# Patient Record
Sex: Female | Born: 1990 | Race: White | Hispanic: No | Marital: Married | Smoking: Current every day smoker
Health system: Southern US, Community
[De-identification: ages and names within clinical notes are randomized; demographics above are authoritative.]

## PROBLEM LIST (undated history)

## (undated) ENCOUNTER — Inpatient Hospital Stay (HOSPITAL_COMMUNITY): Payer: Self-pay

## (undated) DIAGNOSIS — G43909 Migraine, unspecified, not intractable, without status migrainosus: Secondary | ICD-10-CM

## (undated) DIAGNOSIS — F502 Bulimia nervosa, unspecified: Secondary | ICD-10-CM

## (undated) DIAGNOSIS — F329 Major depressive disorder, single episode, unspecified: Secondary | ICD-10-CM

## (undated) DIAGNOSIS — F319 Bipolar disorder, unspecified: Secondary | ICD-10-CM

## (undated) DIAGNOSIS — I10 Essential (primary) hypertension: Secondary | ICD-10-CM

## (undated) DIAGNOSIS — F410 Panic disorder [episodic paroxysmal anxiety] without agoraphobia: Secondary | ICD-10-CM

## (undated) DIAGNOSIS — J302 Other seasonal allergic rhinitis: Secondary | ICD-10-CM

## (undated) DIAGNOSIS — F419 Anxiety disorder, unspecified: Secondary | ICD-10-CM

## (undated) DIAGNOSIS — O139 Gestational [pregnancy-induced] hypertension without significant proteinuria, unspecified trimester: Secondary | ICD-10-CM

## (undated) DIAGNOSIS — K219 Gastro-esophageal reflux disease without esophagitis: Secondary | ICD-10-CM

## (undated) DIAGNOSIS — F32A Depression, unspecified: Secondary | ICD-10-CM

## (undated) HISTORY — DX: Anxiety disorder, unspecified: F41.9

## (undated) HISTORY — DX: Bulimia nervosa, unspecified: F50.20

## (undated) HISTORY — DX: Bulimia nervosa: F50.2

## (undated) HISTORY — PX: TONSILLECTOMY: SUR1361

## (undated) HISTORY — DX: Depression, unspecified: F32.A

## (undated) HISTORY — PX: TYMPANOSTOMY TUBE PLACEMENT: SHX32

## (undated) HISTORY — DX: Gestational (pregnancy-induced) hypertension without significant proteinuria, unspecified trimester: O13.9

## (undated) HISTORY — DX: Major depressive disorder, single episode, unspecified: F32.9

## (undated) HISTORY — DX: Gastro-esophageal reflux disease without esophagitis: K21.9

---

## 2013-10-07 ENCOUNTER — Emergency Department (HOSPITAL_COMMUNITY): Payer: Medicaid Other

## 2013-10-07 ENCOUNTER — Emergency Department (HOSPITAL_COMMUNITY)
Admission: EM | Admit: 2013-10-07 | Discharge: 2013-10-08 | Disposition: A | Discharge status: Home / Self Care | Payer: Medicaid Other | Attending: Emergency Medicine | Admitting: Emergency Medicine

## 2013-10-07 ENCOUNTER — Encounter (HOSPITAL_COMMUNITY): Payer: Self-pay | Admitting: Emergency Medicine

## 2013-10-07 DIAGNOSIS — N898 Other specified noninflammatory disorders of vagina: Secondary | ICD-10-CM | POA: Insufficient documentation

## 2013-10-07 DIAGNOSIS — Z30431 Encounter for routine checking of intrauterine contraceptive device: Secondary | ICD-10-CM | POA: Diagnosis not present

## 2013-10-07 DIAGNOSIS — Z3202 Encounter for pregnancy test, result negative: Secondary | ICD-10-CM | POA: Insufficient documentation

## 2013-10-07 DIAGNOSIS — R110 Nausea: Secondary | ICD-10-CM

## 2013-10-07 DIAGNOSIS — R112 Nausea with vomiting, unspecified: Secondary | ICD-10-CM | POA: Insufficient documentation

## 2013-10-07 DIAGNOSIS — R109 Unspecified abdominal pain: Secondary | ICD-10-CM | POA: Insufficient documentation

## 2013-10-07 DIAGNOSIS — R1032 Left lower quadrant pain: Secondary | ICD-10-CM

## 2013-10-07 DIAGNOSIS — R1031 Right lower quadrant pain: Secondary | ICD-10-CM

## 2013-10-07 LAB — CBC
HCT: 39.4 % (ref 36.0–46.0)
HEMOGLOBIN: 13.6 g/dL (ref 12.0–15.0)
MCH: 29.4 pg (ref 26.0–34.0)
MCHC: 34.5 g/dL (ref 30.0–36.0)
MCV: 85.3 fL (ref 78.0–100.0)
Platelets: 254 10*3/uL (ref 150–400)
RBC: 4.62 MIL/uL (ref 3.87–5.11)
RDW: 12.8 % (ref 11.5–15.5)
WBC: 7.2 10*3/uL (ref 4.0–10.5)

## 2013-10-07 LAB — URINALYSIS, ROUTINE W REFLEX MICROSCOPIC
BILIRUBIN URINE: NEGATIVE
Glucose, UA: NEGATIVE mg/dL
KETONES UR: NEGATIVE mg/dL
NITRITE: NEGATIVE
PH: 5.5 (ref 5.0–8.0)
Protein, ur: NEGATIVE mg/dL
Specific Gravity, Urine: 1.026 (ref 1.005–1.030)
Urobilinogen, UA: 1 mg/dL (ref 0.0–1.0)

## 2013-10-07 LAB — BASIC METABOLIC PANEL
Anion gap: 11 (ref 5–15)
BUN: 15 mg/dL (ref 6–23)
CALCIUM: 9.6 mg/dL (ref 8.4–10.5)
CO2: 25 mEq/L (ref 19–32)
CREATININE: 1.01 mg/dL (ref 0.50–1.10)
Chloride: 104 mEq/L (ref 96–112)
GFR calc Af Amer: >90 mL/min (ref >90)
GFR, EST NON AFRICAN AMERICAN: 79 mL/min — AB (ref >90)
GLUCOSE: 89 mg/dL (ref 70–99)
Potassium: 4.2 mEq/L (ref 3.7–5.3)
SODIUM: 140 meq/L (ref 137–147)

## 2013-10-07 LAB — WET PREP, GENITAL
TRICH WET PREP: NONE SEEN
YEAST WET PREP: NONE SEEN

## 2013-10-07 LAB — URINE MICROSCOPIC-ADD ON

## 2013-10-07 LAB — POC URINE PREG, ED: Preg Test, Ur: NEGATIVE

## 2013-10-07 LAB — LIPASE, BLOOD: Lipase: 23 U/L (ref 11–59)

## 2013-10-07 MED ORDER — ONDANSETRON 4 MG PO TBDP
4.0000 mg | ORAL_TABLET | Freq: Once | ORAL | Status: AC
  Administered 2013-10-08: 4 mg via ORAL
  Filled 2013-10-07: qty 1

## 2013-10-07 MED ORDER — SODIUM CHLORIDE 0.9 % IV BOLUS (SEPSIS): 1000.0000 mL | INTRAVENOUS | Status: DC

## 2013-10-07 MED ORDER — ONDANSETRON HCL 4 MG/2ML IJ SOLN
4.0000 mg | Freq: Once | INTRAMUSCULAR | Status: DC
  Filled 2013-10-07: qty 2

## 2013-10-07 NOTE — ED Notes (Signed)
Patient has been to OB a month ago and was told that every thing was fine after a digital exam, but patient believes the IUD is the source of her pain. She has been spotting ever since having it placed, but this was to be expected. Patient denies any urinary symptoms. She feels like the wire from the device is rubbing against her insides. She has not taken any pain medications.

## 2013-10-07 NOTE — ED Provider Notes (Signed)
CSN: [id number]     Arrival date & time 10/07/13  1858 History   First MD Initiated Contact with Patient 10/07/13 2019     Chief Complaint  Patient presents with  . Nausea  . Emesis     (Consider location/radiation/quality/duration/timing/severity/associated sxs/prior Treatment) The history is provided by the patient and medical records. No language interpreter was used.    Kylie Bailey is a 23 y.o. female G1P0101 with no major medical Hx presents to the Emergency Department complaining of gradual, persistent, progressively worsening lower abd cramping onset 2 days ago, but patient had been experiencing the same pain to a lesser degree intermittently since the insertion of her Mirena IUD in March.  Pt describes the pain as cramping.  She reports a follow up with her GYN who informed her the pain is normal.  Pt reports nausea for the last 2 days with 2 episodes of NBNB emesis.  Pt denies sick contacts.  Pt has not attempted any OTC treatments for the pain.  Nothing makes it better or worse.   Pt denies fever, chills, headache, neck pain, chest pain, SOB, diarrhea, weakness, dizziness, syncope.      History reviewed. No pertinent past medical history. History reviewed. No pertinent past surgical history. History reviewed. No pertinent family history. History  Substance Use Topics  . Smoking status: Passive Smoke Exposure - Never Smoker  . Smokeless tobacco: Not on file  . Alcohol Use: Yes   OB History   Grav Para Term Preterm Abortions TAB SAB Ect Mult Living                 Review of Systems  Constitutional: Negative for fever, diaphoresis, appetite change, fatigue and unexpected weight change.  HENT: Negative for mouth sores and trouble swallowing.   Respiratory: Negative for cough, chest tightness, shortness of breath, wheezing and stridor.   Cardiovascular: Negative for chest pain and palpitations.  Gastrointestinal: Positive for nausea, vomiting (x2) and abdominal pain  (lower). Negative for diarrhea, constipation, blood in stool, abdominal distention and rectal pain.  Genitourinary: Positive for vaginal bleeding (spotting). Negative for dysuria, urgency, frequency, hematuria, flank pain and difficulty urinating.  Musculoskeletal: Negative for back pain, neck pain and neck stiffness.  Skin: Negative for rash.  Neurological: Negative for weakness.  Hematological: Negative for adenopathy.  Psychiatric/Behavioral: Negative for confusion.  All other systems reviewed and are negative.     Allergies  Review of patient's allergies indicates no known allergies.  Home Medications   Prior to Admission medications   Medication Sig Start Date End Date Taking? Authorizing Provider  levonorgestrel (MIRENA) 20 MCG/24HR IUD 1 each by Intrauterine route once.   Yes Historical Provider, MD  ondansetron (ZOFRAN ODT) 4 MG disintegrating tablet 4mg  ODT q4 hours prn nausea/vomit 10/08/13   Edina Winningham, PA-C   BP 132/80  Pulse 71  Temp(Src) 98.3 F (36.8 C) (Oral)  Resp 19  Ht 5\' 9"  (1.753 m)  Wt 284 lb (128.822 kg)  BMI 41.92 kg/m2  SpO2 99% Physical Exam  Nursing note and vitals reviewed. Constitutional: She is oriented to person, place, and time. She appears well-developed and well-nourished. No distress.  Awake, alert, nontoxic appearance  HENT:  Head: Normocephalic and atraumatic.  Mouth/Throat: Oropharynx is clear and moist. No oropharyngeal exudate.  Eyes: Conjunctivae are normal. No scleral icterus.  Neck: Normal range of motion. Neck supple.  Cardiovascular: Normal rate, regular rhythm, normal heart sounds and intact distal pulses.   No murmur heard. Pulmonary/Chest: Effort normal  and breath sounds normal. No respiratory distress. She has no wheezes.  Abdominal: Soft. Bowel sounds are normal. She exhibits no distension and no mass. There is no hepatosplenomegaly. There is no tenderness. There is no rebound, no guarding and no CVA tenderness.   Genitourinary: Uterus normal. Pelvic exam was performed with patient supine. There is no rash, tenderness or lesion on the right labia. There is no rash, tenderness or lesion on the left labia. Uterus is not deviated, not enlarged, not fixed and not tender. Cervix exhibits no motion tenderness, no discharge and no friability. Right adnexum displays no mass, no tenderness and no fullness. Left adnexum displays no mass, no tenderness and no fullness. There is bleeding (scant) around the vagina. No erythema or tenderness around the vagina. No foreign body around the vagina. No signs of injury around the vagina. No vaginal discharge found.  Scant amount of brown discharge/blood in the vaginal vault Strings of IUD visible in the cervical os; no portion of the IUD is visible; IUD not palpable   Musculoskeletal: Normal range of motion. She exhibits no edema.  Lymphadenopathy:    She has no cervical adenopathy.  Neurological: She is alert and oriented to person, place, and time. She exhibits normal muscle tone. Coordination normal.  Speech is clear and goal oriented Moves extremities without ataxia  Skin: Skin is warm and dry. No rash noted. She is not diaphoretic. No erythema.  Psychiatric: She has a normal mood and affect.    ED Course  Procedures (including critical care time) Labs Review Labs Reviewed  WET PREP, GENITAL - Abnormal; Notable for the following:    Clue Cells Wet Prep HPF POC RARE (*)    WBC, Wet Prep HPF POC RARE (*)    All other components within normal limits  BASIC METABOLIC PANEL - Abnormal; Notable for the following:    GFR calc non Af Amer 79 (*)    All other components within normal limits  URINALYSIS, ROUTINE W REFLEX MICROSCOPIC - Abnormal; Notable for the following:    APPearance CLOUDY (*)    Hgb urine dipstick LARGE (*)    Leukocytes, UA SMALL (*)    All other components within normal limits  URINE MICROSCOPIC-ADD ON - Abnormal; Notable for the following:     Squamous Epithelial / LPF FEW (*)    All other components within normal limits  GC/CHLAMYDIA PROBE AMP  CBC  LIPASE, BLOOD  POC URINE PREG, ED    Imaging Review US Transvaginal Non-ob  10/07/2013   CLINICAL DATA:  Pelvic pain and vaginal bleeding. Query IUD placement.  EXAM: TRANSABDOMINAL AND TRANSVAGINAL ULTRASOUND OF PELVIS  TECHNIQUE: Both transabdominal and transvaginal ultrasound examinations of the pelvis were performed. Transabdominal technique was performed for global imaging of the pelvis including uterus, ovaries, adnexal regions, and pelvic cul-de-sac. It was necessary to proceed with endovaginal exam following the transabdominal exam to visualize the endometrium and ovaries.  COMPARISON:  None  FINDINGS: Uterus  Measurements: 7.7 x 3.7 x 4.3 cm, anteverted. No fibroids or other mass visualized.  Endometrium  Thickness: 4.7 mm. Echogenic shadowing structure consistent with intrauterine device. No focal abnormality visualized.  Right ovary  Measurements: 3.6 x 2.4 x 2.3 cm. Normal appearance/no adnexal mass.  Left ovary  Measurements: 3.7 x 2.3 x 3.1 cm. Normal appearance/no adnexal mass.  Other findings  Minimal free fluid in the pelvis is likely physiologic.  IMPRESSION: Intrauterine device is demonstrated. Uterus and ovaries are otherwise unremarkable.   Electronically Signed  By: Burman NievesWilliam  Stevens M.D.   On: 10/07/2013 23:01   [place] Pelvis Complete  10/07/2013   CLINICAL DATA:  Pelvic pain and vaginal bleeding. Query IUD placement.  EXAM: TRANSABDOMINAL AND TRANSVAGINAL ULTRASOUND OF PELVIS  TECHNIQUE: Both transabdominal and transvaginal ultrasound examinations of the pelvis were performed. Transabdominal technique was performed for global imaging of the pelvis including uterus, ovaries, adnexal regions, and pelvic cul-de-sac. It was necessary to proceed with endovaginal exam following the transabdominal exam to visualize the endometrium and ovaries.  COMPARISON:  None  FINDINGS: Uterus   Measurements: 7.7 x 3.7 x 4.3 cm, anteverted. No fibroids or other mass visualized.  Endometrium  Thickness: 4.7 mm. Echogenic shadowing structure consistent with intrauterine device. No focal abnormality visualized.  Right ovary  Measurements: 3.6 x 2.4 x 2.3 cm. Normal appearance/no adnexal mass.  Left ovary  Measurements: 3.7 x 2.3 x 3.1 cm. Normal appearance/no adnexal mass.  Other findings  Minimal free fluid in the pelvis is likely physiologic.  IMPRESSION: Intrauterine device is demonstrated. Uterus and ovaries are otherwise unremarkable.   Electronically Signed   By: Burman NievesWilliam  Stevens M.D.   On: 10/07/2013 23:01     EKG Interpretation None      MDM   Final diagnoses:  IUD check up  Bilateral lower abdominal cramping  Nausea   Kylie Bailey presents with nausea and emesis x2 the last several days with complaints of lower abdominal cramping since the insertion of her Mirena IUD.  Patient has seen OB/GYN who reports this is normal she states she does not think that it is.  Patient refuses IV, fluid bolus but will take nausea medicine by mouth.  All labs unremarkable; no evidence of STD on wet prep.    US pending to confirm proper placement of her IUD.    11:59 PM US with normal placement of IUD.  Patient's pain is likely secondary to her IUD. Patient given Zofran with resolution of her nausea and she has tolerated by mouth fluids and crackers without difficulty.  Patient is nontoxic, nonseptic appearing, in no apparent distress.  Patient's pain and other symptoms adequately managed in emergency department.  Fluid bolus given.  Labs, imaging and vitals reviewed.  Patient does not meet the SIRS or Sepsis criteria.  On repeat exam patient does not have a surgical abdomin and there are nor peritoneal signs.  No indication of appendicitis, bowel obstruction, bowel perforation, cholecystitis, diverticulitis, PID or ectopic pregnancy.  Patient discharged home with symptomatic treatment and  given strict instructions for follow-up with their primary care physician.  I have also discussed reasons to return immediately to the ER.  Patient expresses understanding and agrees with plan.  I have personally reviewed patient's vitals, nursing note and any pertinent labs or imaging.  I performed an undressed physical exam.    At this time, it has been determined that no acute conditions requiring further emergency intervention. The patient/guardian have been advised of the diagnosis and plan. I reviewed all labs and imaging including any potential incidental findings. We have discussed signs and symptoms that warrant return to the ED, such as intractable vomiting, high fevers or other concerning symptoms.  Patient/guardian has voiced understanding and agreed to follow-up with the PCP or specialist in 3 days.  Vital signs are stable at discharge.   BP 132/80  Pulse 71  Temp(Src) 98.3 F (36.8 C) (Oral)  Resp 19  Ht 5\' 9"  (1.753 m)  Wt 284 lb (128.822 kg)  BMI 41.92 kg/m2  SpO2 99%            Dierdre ForthHannah Haddie Bruhl, PA-C 10/08/13 [phone]

## 2013-10-07 NOTE — ED Notes (Signed)
Pt arrived to the ED with abdominal pain.  Pt states she has had off and on abdominal pain for four months since she had a contraceptive devise implanted.  Pt states in the last two days she has experienced nausea and emesis.  Pt describes two episodes of emesis in the last 24 hours.  Pt denies diarrhea

## 2013-10-08 LAB — GC/CHLAMYDIA PROBE AMP
CT PROBE, AMP APTIMA: NEGATIVE
GC PROBE AMP APTIMA: NEGATIVE

## 2013-10-08 MED ORDER — ONDANSETRON 4 MG PO TBDP: ORAL_TABLET | ORAL | Status: DC

## 2013-10-08 NOTE — Discharge Instructions (Signed)
1. Medications: zofran, usual home medications 2. Treatment: rest, drink plenty of fluids,  3. Follow Up: Please followup with your OB/GYN in 3 days for discussion of today's visit   Nausea, Adult Nausea means you feel sick to your stomach or need to throw up (vomit). It may be a sign of a more serious problem. If nausea gets worse, you may throw up. If you throw up a lot, you may lose too much body fluid (dehydration). HOME CARE   Get plenty of rest.  Ask your doctor how to replace body fluid losses (rehydrate).  Eat small amounts of food. Sip liquids more often.  Take all medicines as told by your doctor. GET HELP RIGHT AWAY IF:  You have a fever.  You pass out (faint).  You keep throwing up or have blood in your throw up.  You are very weak, have dry lips or a dry mouth, or you are very thirsty (dehydrated).  You have dark or bloody poop (stool).  You have very bad chest or belly (abdominal) pain.  You do not get better after 2 days, or you get worse.  You have a headache. MAKE SURE YOU:  Understand these instructions.  Will watch your condition.  Will get help right away if you are not doing well or get worse. Document Released: 03/11/2011 Document Revised: 06/14/2011 Document Reviewed: 03/11/2011 [facility] Patient Information 2015 [place]. This information is not intended to replace advice given to you by your health care provider. Make sure you discuss any questions you have with your health care provider.

## 2013-10-10 NOTE — ED Provider Notes (Signed)
Medical screening examination/treatment/procedure(s) were performed by non-physician practitioner and as supervising physician I was immediately available for consultation/collaboration.   EKG Interpretation None       Martha K Linker, MD 10/10/13 0808 

## 2014-01-02 ENCOUNTER — Encounter (HOSPITAL_COMMUNITY): Payer: Self-pay | Admitting: General Practice

## 2014-01-02 ENCOUNTER — Inpatient Hospital Stay (HOSPITAL_COMMUNITY)
Admission: AD | Admit: 2014-01-02 | Discharge: 2014-01-02 | Disposition: A | Discharge status: Home / Self Care | Payer: Medicaid Other | Source: Ambulatory Visit | Attending: Obstetrics & Gynecology | Admitting: Obstetrics & Gynecology

## 2014-01-02 ENCOUNTER — Inpatient Hospital Stay (HOSPITAL_COMMUNITY): Payer: Medicaid Other

## 2014-01-02 DIAGNOSIS — R109 Unspecified abdominal pain: Secondary | ICD-10-CM | POA: Insufficient documentation

## 2014-01-02 DIAGNOSIS — Z30431 Encounter for routine checking of intrauterine contraceptive device: Secondary | ICD-10-CM | POA: Insufficient documentation

## 2014-01-02 DIAGNOSIS — T8384XA Pain from genitourinary prosthetic devices, implants and grafts, initial encounter: Secondary | ICD-10-CM

## 2014-01-02 DIAGNOSIS — G8918 Other acute postprocedural pain: Secondary | ICD-10-CM

## 2014-01-02 DIAGNOSIS — T8389XA Other specified complication of genitourinary prosthetic devices, implants and grafts, initial encounter: Secondary | ICD-10-CM

## 2014-01-02 LAB — WET PREP, GENITAL
CLUE CELLS WET PREP: NONE SEEN
TRICH WET PREP: NONE SEEN
Yeast Wet Prep HPF POC: NONE SEEN

## 2014-01-02 LAB — URINE MICROSCOPIC-ADD ON

## 2014-01-02 LAB — URINALYSIS, ROUTINE W REFLEX MICROSCOPIC
Bilirubin Urine: NEGATIVE
Glucose, UA: NEGATIVE mg/dL
KETONES UR: NEGATIVE mg/dL
NITRITE: NEGATIVE
PROTEIN: NEGATIVE mg/dL
Specific Gravity, Urine: 1.025 (ref 1.005–1.030)
UROBILINOGEN UA: 0.2 mg/dL (ref 0.0–1.0)
pH: 6 (ref 5.0–8.0)

## 2014-01-02 LAB — POCT PREGNANCY, URINE: Preg Test, Ur: NEGATIVE

## 2014-01-02 MED ORDER — KETOROLAC TROMETHAMINE 60 MG/2ML IM SOLN
60.0000 mg | Freq: Once | INTRAMUSCULAR | Status: AC
  Administered 2014-01-02: 60 mg via INTRAMUSCULAR
  Filled 2014-01-02: qty 2

## 2014-01-02 MED ORDER — IBUPROFEN 600 MG PO TABS: 600.0000 mg | ORAL_TABLET | Freq: Four times a day (QID) | ORAL | Status: DC | PRN

## 2014-01-02 NOTE — MAU Note (Signed)
Pt states here for lower abd pain. [place] was still open [place] and was referred to another doctor. Moved here and hasnt had eval. Does have mirena IUD in place x9 months. Feels like labor pains. [facility] [place]. Notes yellow vaginal discharge, non-odorous. No bleeding at present.

## 2014-01-02 NOTE — MAU Note (Signed)
Lower abd cramping x1 month intermittently

## 2014-01-02 NOTE — MAU Provider Note (Signed)
History     CSN: [id number]  Arrival date and time: 01/02/14 1027   First Provider Initiated Contact with Patient 01/02/14 1114      Chief Complaint  Patient presents with  . Abdominal Pain   HPI  [name] is a 23 y.o.female G1P1001 who presents with abdominal pain. She had a mirena IUD placed in March and since then feels contraption like pain in her lower abdomen. The pain comes and goes and at times is sharp. Her IUD was placed in [place]. She has not had anything for pain today.   OB History   Grav Para Term Preterm Abortions TAB SAB Ect Mult Living   1 1 1       1       Past Medical History  Diagnosis Date  . Medical history non-contributory     Past Surgical History  Procedure Laterality Date  . Tonsillectomy      History reviewed. No pertinent family history.  History  Substance Use Topics  . Smoking status: Passive Smoke Exposure - Never Smoker  . Smokeless tobacco: Not on file  . Alcohol Use: Yes    Allergies: No Known Allergies  Prescriptions prior to admission  Medication Sig Dispense Refill  . buPROPion (WELLBUTRIN) 75 MG tablet Take 75 mg by mouth daily.      Marland Kitchen levonorgestrel (MIRENA) 20 MCG/24HR IUD 1 each by Intrauterine route once.       Results for orders placed during the hospital encounter of 01/02/14 (from the past 48 hour(s))  URINALYSIS, ROUTINE W REFLEX MICROSCOPIC     Status: Abnormal   Collection Time    01/02/14  9:31 AM      Result Value Ref Range   Color, Urine YELLOW  YELLOW   APPearance CLEAR  CLEAR   Specific Gravity, Urine 1.025  1.005 - 1.030   pH 6.0  5.0 - 8.0   Glucose, UA NEGATIVE  NEGATIVE mg/dL   Hgb urine dipstick LARGE (*) NEGATIVE   Bilirubin Urine NEGATIVE  NEGATIVE   Ketones, ur NEGATIVE  NEGATIVE mg/dL   Protein, ur NEGATIVE  NEGATIVE mg/dL   Urobilinogen, UA 0.2  0.0 - 1.0 mg/dL   Nitrite NEGATIVE  NEGATIVE   Leukocytes, UA TRACE (*) NEGATIVE  URINE MICROSCOPIC-ADD ON     Status: None   Collection Time    01/02/14  9:31 AM      Result Value Ref Range   Squamous Epithelial / LPF RARE  RARE   WBC, UA 3-6  <3 WBC/hpf   RBC / HPF 3-6  <3 RBC/hpf   Bacteria, UA RARE  RARE  POCT PREGNANCY, URINE     Status: None   Collection Time    01/02/14  9:50 AM      Result Value Ref Range   Preg Test, Ur NEGATIVE  NEGATIVE   Comment:            THE SENSITIVITY OF THIS     METHODOLOGY IS >24 mIU/mL  WET PREP, GENITAL     Status: Abnormal   Collection Time    01/02/14 11:36 AM      Result Value Ref Range   Yeast Wet Prep HPF POC NONE SEEN  NONE SEEN   Trich, Wet Prep NONE SEEN  NONE SEEN   Clue Cells Wet Prep HPF POC NONE SEEN  NONE SEEN   WBC, Wet Prep HPF POC FEW (*) NONE SEEN   Comment: FEW BACTERIA SEEN  [place] Transvaginal Non-ob  01/02/2014   CLINICAL DATA:  Irregular bleeding, abdominal pain, intrauterine contraceptive device.  EXAM: TRANSVAGINAL ULTRASOUND OF PELVIS  TECHNIQUE: Transvaginal ultrasound examination of the pelvis was performed including evaluation of the uterus, ovaries, adnexal regions, and pelvic cul-de-sac.  COMPARISON:  10/07/2013.  FINDINGS: Uterus  Measurements: 7.9 x 3.8 x 4.3 cm. No fibroids or other mass visualized.  Endometrium  Thickness: 2 mm. Intrauterine contraceptive device is seen within the lower uterine segment.  Right ovary  Measurements: 2.9 x 2.1 x 3.0 cm. Normal appearance/no adnexal mass.  Left ovary  Measurements: 3.1 x 2.1 x 3.0 cm. Normal appearance/no adnexal mass.  Other findings:  No free fluid.  IMPRESSION: 1. Intrauterine contraceptive device is seen the lower uterine segment. 2. Otherwise, no acute findings.   Electronically Signed   By: Leanna BattlesMelinda  Blietz M.D.   On: 01/02/2014 12:39      Review of Systems  Constitutional: Negative for fever and chills.  Gastrointestinal: Positive for abdominal pain. Negative for nausea, vomiting, diarrhea and constipation.  Genitourinary: Negative for dysuria, urgency, frequency and hematuria.        No vaginal discharge. + vaginal bleeding; she has irregular bleeding with IUD No dysuria.    Physical Exam   Blood pressure 123/80, pulse 68, temperature 97.9 F (36.6 C), temperature source Oral, resp. rate 16, height 5\' 9"  (1.753 m), weight 125.646 kg (277 lb).  Physical Exam  Constitutional: She is oriented to person, place, and time. She appears well-developed and well-nourished.  Non-toxic appearance. She does not have a sickly appearance. She does not appear ill. No distress.  HENT:  Head: Normocephalic.  Eyes: Pupils are equal, round, and reactive to light.  Neck: Neck supple.  Respiratory: Effort normal.  GI: Soft. Normal appearance. There is tenderness in the right lower quadrant, suprapubic area and left lower quadrant. There is no rigidity, no rebound and no guarding.  Genitourinary:  Speculum exam: Vagina - Small amount of dark red, mucus like discharge in vaginal canal.  Cervix - + Scant active bleeding; IUD strings visualized in the cervix.  Bimanual exam: Cervix closed, No CMT  Uterus non tender, normal size Bilateral Adnexal tenderness, + suprapubic tenderness no masses bilaterally GC/Chlam, wet prep done Chaperone present for exam.   Musculoskeletal: Normal range of motion.  Neurological: She is alert and oriented to person, place, and time.  Skin: Skin is warm. She is not diaphoretic.  Psychiatric: Her behavior is normal.    MAU Course  Procedures None  MDM UA  Upt-negative Wet prep GC US Toradol IM  Patient rates her pain 0/10 following Toradol   Assessment and Plan   A: Abdominal pain with mirena IUD  IUD in lower uterine segment seen on US.   P: Discharge home in stable condition Follow up in the clinic if needed; discussed with the patient that there is no clinical indication to remove the IUD unless the patient wants the IUD removed. Return to MAU if symptoms worsen   Kylie HansenJennifer Irene Rasch, NP 01/02/2014 12:59 PM

## 2014-01-03 LAB — GC/CHLAMYDIA PROBE AMP
CT PROBE, AMP APTIMA: NEGATIVE
GC Probe RNA: NEGATIVE

## 2014-01-03 NOTE — MAU Provider Note (Signed)
Attestation of Attending Supervision of Advanced Practitioner (PA/CNM/NP): Evaluation and management procedures were performed by the Advanced Practitioner under my supervision and collaboration.  I have reviewed the Advanced Practitioner's note and chart, and I agree with the management and plan.  Tesla Keeler, MD, FACOG Attending Obstetrician & Gynecologist Faculty Practice, [facility]   

## 2014-01-04 ENCOUNTER — Emergency Department (HOSPITAL_COMMUNITY)
Admission: EM | Admit: 2014-01-04 | Discharge: 2014-01-05 | Disposition: A | Discharge status: Home / Self Care | Payer: Self-pay | Attending: Emergency Medicine | Admitting: Emergency Medicine

## 2014-01-04 ENCOUNTER — Encounter (HOSPITAL_COMMUNITY): Payer: Self-pay | Admitting: Emergency Medicine

## 2014-01-04 ENCOUNTER — Emergency Department (HOSPITAL_COMMUNITY): Payer: Self-pay

## 2014-01-04 DIAGNOSIS — Y9289 Other specified places as the place of occurrence of the external cause: Secondary | ICD-10-CM | POA: Insufficient documentation

## 2014-01-04 DIAGNOSIS — S62604A Fracture of unspecified phalanx of right ring finger, initial encounter for closed fracture: Secondary | ICD-10-CM

## 2014-01-04 DIAGNOSIS — S62624A Displaced fracture of medial phalanx of right ring finger, initial encounter for closed fracture: Secondary | ICD-10-CM | POA: Insufficient documentation

## 2014-01-04 DIAGNOSIS — W2209XA Striking against other stationary object, initial encounter: Secondary | ICD-10-CM | POA: Insufficient documentation

## 2014-01-04 DIAGNOSIS — Y9389 Activity, other specified: Secondary | ICD-10-CM | POA: Insufficient documentation

## 2014-01-04 MED ORDER — HYDROCODONE-ACETAMINOPHEN 5-325 MG PO TABS
1.0000 | ORAL_TABLET | Freq: Once | ORAL | Status: AC
  Administered 2014-01-04: 1 via ORAL
  Filled 2014-01-04: qty 1

## 2014-01-04 NOTE — ED Notes (Signed)
Pt reports slamming right hand in car door 2 days ago.Bruising noted to hand. Reports increase pain, denies taking anything. Pulses/sensation intact.
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
## 2014-01-05 MED ORDER — HYDROCODONE-ACETAMINOPHEN 5-325 MG PO TABS
1.0000 | ORAL_TABLET | Freq: Four times a day (QID) | ORAL | Status: DC | PRN
Start: 2014-01-05 — End: 2015-12-02

## 2014-01-05 NOTE — Discharge Instructions (Signed)

## 2014-01-05 NOTE — Progress Notes (Signed)
Orthopedic Tech Progress Note Patient Details:  Kylie MayansMillinda Bailey 06/06/1990 [id number]  Ortho Devices Type of Ortho Device: Finger splint Ortho Device/Splint Interventions: Application   Haskell Flirtewsome, Lilia Letterman M 01/05/2014, 12:46 AM

## 2014-01-05 NOTE — ED Provider Notes (Signed)
Medical screening examination/treatment/procedure(s) were performed by non-physician practitioner and as supervising physician I was immediately available for consultation/collaboration.    Orlyn Odonoghue D Erandy Mceachern, MD 01/05/14 1136 

## 2014-01-22 ENCOUNTER — Encounter (HOSPITAL_COMMUNITY): Payer: Self-pay | Admitting: Emergency Medicine

## 2014-01-22 ENCOUNTER — Emergency Department (INDEPENDENT_AMBULATORY_CARE_PROVIDER_SITE_OTHER)
Admission: EM | Admit: 2014-01-22 | Discharge: 2014-01-22 | Disposition: A | Discharge status: Home / Self Care | Payer: Self-pay | Source: Home / Self Care | Attending: Family Medicine | Admitting: Family Medicine

## 2014-01-22 DIAGNOSIS — H65192 Other acute nonsuppurative otitis media, left ear: Secondary | ICD-10-CM

## 2014-01-22 MED ORDER — IPRATROPIUM BROMIDE 0.06 % NA SOLN: 2.0000 | Freq: Four times a day (QID) | NASAL | Status: DC

## 2014-01-22 MED ORDER — AMOXICILLIN-POT CLAVULANATE 875-125 MG PO TABS: 1.0000 | ORAL_TABLET | Freq: Two times a day (BID) | ORAL | Status: DC
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
## 2014-01-22 NOTE — ED Notes (Signed)
Pt  Reports     l  Earache          With  stuffyness         And discomfort       X  2   Days       Pt    Also    Reports  Being  Dizzy    As  Well   -     Pt     Ambulated to  Room  With  A  Steady  Fluid  Gait

## 2014-02-05 ENCOUNTER — Encounter (HOSPITAL_COMMUNITY): Payer: Self-pay | Admitting: Emergency Medicine

## 2014-02-08 ENCOUNTER — Encounter: Payer: Medicaid Other | Admitting: Family Medicine

## 2014-02-25 ENCOUNTER — Emergency Department (INDEPENDENT_AMBULATORY_CARE_PROVIDER_SITE_OTHER)
Admission: EM | Admit: 2014-02-25 | Discharge: 2014-02-25 | Disposition: A | Discharge status: Home / Self Care | Payer: Self-pay | Source: Home / Self Care | Attending: Family Medicine | Admitting: Family Medicine

## 2014-02-25 ENCOUNTER — Encounter (HOSPITAL_COMMUNITY): Payer: Self-pay | Admitting: Emergency Medicine

## 2014-02-25 DIAGNOSIS — R0982 Postnasal drip: Secondary | ICD-10-CM

## 2014-02-25 DIAGNOSIS — J069 Acute upper respiratory infection, unspecified: Secondary | ICD-10-CM

## 2014-02-25 DIAGNOSIS — B9789 Other viral agents as the cause of diseases classified elsewhere: Secondary | ICD-10-CM

## 2014-02-25 MED ORDER — IPRATROPIUM BROMIDE 0.06 % NA SOLN: 2.0000 | Freq: Four times a day (QID) | NASAL | Status: DC

## 2014-02-25 MED ORDER — BENZONATATE 100 MG PO CAPS: 100.0000 mg | ORAL_CAPSULE | Freq: Three times a day (TID) | ORAL | Status: DC | PRN

## 2014-02-25 MED ORDER — AMOXICILLIN-POT CLAVULANATE 875-125 MG PO TABS: 1.0000 | ORAL_TABLET | Freq: Two times a day (BID) | ORAL | Status: DC

## 2014-02-25 NOTE — ED Notes (Signed)
Pt complains of cough and congestion for one week.  She states her throat hurts from the coughing and does not think it is strep throat.

## 2014-02-25 NOTE — Discharge Instructions (Signed)
Your symptoms are likely related to post nasal drip from a viral infection Please start using nasal saline 2-4 times a day to fluxsh out your nasal passges Please use the nasal atrovent to help dry up the secretions Pelase use your nasocort at night before bed Please only start the augmentin if you are not better in another 3-7 days or if you develop sinus pauin and pressure and fevers.  Please get the tessalon perles if needed for your cough.

## 2014-02-25 NOTE — ED Provider Notes (Signed)
CSN: [id number]     Arrival date & time 02/25/14  1156 History   First MD Initiated Contact with Patient 02/25/14 1309     Chief Complaint  Patient presents with  . Nasal Congestion  . Cough  . Sore Throat   (Consider location/radiation/quality/duration/timing/severity/associated sxs/prior Treatment) HPI  Sore throat: started 7 days ago. Developed cough and chest congestion, rinorrhea. Getting worse. Denies fevers, nausea, vomiting, diarrhea, and sinus congestion.   Past Medical History  Diagnosis Date  . Medical history non-contributory    Past Surgical History  Procedure Laterality Date  . Tonsillectomy     History reviewed. No pertinent family history. History  Substance Use Topics  . Smoking status: Current Some Day Smoker -- 0.10 packs/day  . Smokeless tobacco: Not on file  . Alcohol Use: Yes   OB History    Gravida Para Term Preterm AB TAB SAB Ectopic Multiple Living   1 1 1       1      Review of Systems Per HPI with all other pertinent systems negative.   Allergies  Review of patient's allergies indicates no known allergies.  Home Medications   Prior to Admission medications   Medication Sig Start Date End Date Taking? Authorizing Provider  levonorgestrel (MIRENA) 20 MCG/24HR IUD 1 each by Intrauterine route once.   Yes Historical Provider, MD  amoxicillin-clavulanate (AUGMENTIN) 875-125 MG per tablet Take 1 tablet by mouth 2 (two) times daily. 02/25/14   Ozella Rocksavid J Paislei Dorval, MD  benzonatate (TESSALON PERLES) 100 MG capsule Take 1-2 capsules (100-200 mg total) by mouth 3 (three) times daily as needed for cough. 02/25/14   Ozella Rocksavid J Ezrael Sam, MD  HYDROcodone-acetaminophen (NORCO/VICODIN) 5-325 MG per tablet Take 1 tablet by mouth every 6 (six) hours as needed for moderate pain or severe pain. 01/05/14   Fayrene HelperBowie Tran, PA-C  ipratropium (ATROVENT) 0.06 % nasal spray Place 2 sprays into both nostrils 4 (four) times daily. 02/25/14   Ozella Rocksavid J Fable Huisman, MD   BP 121/84 mmHg   Pulse 100  Temp(Src) 98.5 F (36.9 C) (Oral)  Resp 12  SpO2 97% Physical Exam  Constitutional: She is oriented to person, place, and time. She appears well-developed and well-nourished.  HENT:  Head: Normocephalic and atraumatic.  Nasal speech and pharyngeal cobblestoning Maxillary and frontal sinuses non-ttp  Eyes: EOM are normal. Pupils are equal, round, and reactive to light.  Neck: Normal range of motion.  Cardiovascular: Normal rate, normal heart sounds and intact distal pulses.   No murmur heard. Pulmonary/Chest: Effort normal and breath sounds normal. No respiratory distress.  Abdominal: Soft.  Musculoskeletal: Normal range of motion. She exhibits no tenderness.  Lymphadenopathy:    She has cervical adenopathy.  Neurological: She is oriented to person, place, and time.  Skin: Skin is warm. She is not diaphoretic.  Psychiatric: She has a normal mood and affect. Her behavior is normal. Judgment and thought content normal.    ED Course  Procedures (including critical care time) Labs Review Labs Reviewed - No data to display  Imaging Review No results found.   MDM   1. Post-nasal drip   2. Viral URI with cough    Start nasocort and nasal atrovent Start Ibuprofen Nasal saline Start ABX if develops fevers and sinus ttp or continues for another 3-7 days Tessalon perles Precautions given and all questions answered  Shelly Flattenavid Kennon Encinas, MD Family Medicine 02/25/2014, 1:46 PM      Ozella Rocksavid J Elisia Stepp, MD 02/25/14 1346

## 2014-04-20 ENCOUNTER — Encounter (HOSPITAL_COMMUNITY): Payer: Self-pay | Admitting: Emergency Medicine

## 2014-04-20 ENCOUNTER — Emergency Department (INDEPENDENT_AMBULATORY_CARE_PROVIDER_SITE_OTHER)
Admission: EM | Admit: 2014-04-20 | Discharge: 2014-04-20 | Disposition: A | Discharge status: Home / Self Care | Payer: Self-pay | Source: Home / Self Care | Attending: Emergency Medicine | Admitting: Emergency Medicine

## 2014-04-20 DIAGNOSIS — A084 Viral intestinal infection, unspecified: Secondary | ICD-10-CM

## 2014-04-20 LAB — POCT I-STAT, CHEM 8
BUN: 12 mg/dL (ref 6–23)
CHLORIDE: 107 meq/L (ref 96–112)
CREATININE: 0.9 mg/dL (ref 0.50–1.10)
Calcium, Ion: 1.19 mmol/L (ref 1.12–1.23)
Glucose, Bld: 98 mg/dL (ref 70–99)
HCT: 42 % (ref 36.0–46.0)
Hemoglobin: 14.3 g/dL (ref 12.0–15.0)
Potassium: 4.3 mmol/L (ref 3.5–5.1)
SODIUM: 142 mmol/L (ref 135–145)
TCO2: 20 mmol/L (ref 0–100)

## 2014-04-20 MED ORDER — ONDANSETRON 4 MG PO TBDP
8.0000 mg | ORAL_TABLET | Freq: Once | ORAL | Status: AC
  Administered 2014-04-20: 8 mg via ORAL

## 2014-04-20 MED ORDER — ONDANSETRON 8 MG PO TBDP: 8.0000 mg | ORAL_TABLET | Freq: Three times a day (TID) | ORAL | Status: DC | PRN

## 2014-04-20 MED ORDER — ONDANSETRON 4 MG PO TBDP
ORAL_TABLET | ORAL | Status: AC
  Filled 2014-04-20: qty 2

## 2014-04-20 MED ORDER — DIPHENOXYLATE-ATROPINE 2.5-0.025 MG PO TABS
1.0000 | ORAL_TABLET | Freq: Four times a day (QID) | ORAL | Status: DC | PRN
Start: 2014-04-20 — End: 2015-12-02

## 2014-04-20 NOTE — Discharge Instructions (Signed)

## 2014-04-20 NOTE — ED Provider Notes (Signed)
Chief Complaint   Influenza    History of Present Illness   Kylie MayansMillinda Bailey is a 24 year old female who has had a three-day history of nausea, vomiting, and diarrhea. She vomited once today and about 7 times yesterday. She's had 2 diarrheal stool today and about 12 yesterday. She notes headache, subjective fever, chills, upper abdominal pain and dizziness. There is no blood in the vomitus, coffee-ground emesis, or bilious emesis. She denies any hematochezia or melena. She's had no muscle cramping. No suspicious ingestions or foreign travel.  Review of Systems   Other than as noted above, the patient denies any of the following symptoms: Systemic:  No fevers, chills, or dizziness. GI:  Blood in stool or vomitus. GU:  No dysuria, frequency, or urgency.  PMFSH   Past medical history, family history, social history, meds, and allergies were reviewed.    Physical Exam     Vital signs:  BP 129/83 mmHg  Pulse 88  Temp(Src) 98.4 F (36.9 C) (Oral)  Resp 16  SpO2 97% Orthostatic VS for the past 24 hrs:  BP- Lying Pulse- Lying BP- Sitting Pulse- Sitting BP- Standing at 0 minutes Pulse- Standing at 0 minutes  04/20/14 1213 141/83 mmHg 83 130/81 mmHg 89 138/85 mmHg 93   General:  Alert and oriented.  In no distress.  Skin warm and dry.  Good skin turgor, brisk capillary refill. ENT:  No scleral icterus, moist mucous membranes, no oral lesions, pharynx clear. Lungs:  Breath sounds clear and equal bilaterally.  No wheezes, rales, or rhonchi. Heart:  Rhythm regular, without extrasystoles.  No gallops or murmers. Abdomen:  Soft, flat, nondistended. No organomegaly or mass. Bowel sounds are hyperactive. She has mild, diffuse tenderness to palpation without guarding or rebound. No localizing tenderness to palpation. Skin: Clear, warm, and dry.  Good turgor.  Brisk capillary refill.  Labs   Results for orders placed or performed during the hospital encounter of 04/20/14  I-STAT, chem 8    Result Value Ref Range   Sodium 142 135 - 145 mmol/L   Potassium 4.3 3.5 - 5.1 mmol/L   Chloride 107 96 - 112 mEq/L   BUN 12 6 - 23 mg/dL   Creatinine, Ser 1.610.90 0.50 - 1.10 mg/dL   Glucose, Bld 98 70 - 99 mg/dL   Calcium, Ion 0.961.19 0.451.12 - 1.23 mmol/L   TCO2 20 0 - 100 mmol/L   Hemoglobin 14.3 12.0 - 15.0 g/dL   HCT 40.942.0 81.136.0 - 91.446.0 %    Course in Urgent Care Center   The following medications were given:  Medications  ondansetron (ZOFRAN-ODT) disintegrating tablet 8 mg (8 mg Oral Given 04/20/14 1218)   Assessment   The encounter diagnosis was Viral gastroenteritis.  Plan   1.  Meds:  The following meds were prescribed:  Discharge Medication List as of 04/20/2014 12:36 PM    START taking these medications   Details  diphenoxylate-atropine (LOMOTIL) 2.5-0.025 MG per tablet Take 1 tablet by mouth 4 (four) times daily as needed for diarrhea or loose stools., Starting 04/20/2014, Until Discontinued, Print    ondansetron (ZOFRAN ODT) 8 MG disintegrating tablet Take 1 tablet (8 mg total) by mouth every 8 (eight) hours as needed for nausea., Starting 04/20/2014, Until Discontinued, Normal        2.  Patient Education/Counseling:  The patient was given appropriate handouts, self care instructions, and instructed in symptomatic relief. The patient was told to stay on clear liquids for the remainder of the  day, then advance to a B.R.A.T. diet starting tomorrow.   3.  Follow up:  The patient was told to follow up here if no better in 2 to 3 days, or sooner if becoming worse in any way, and given some red flag symptoms such as persistent vomitng, high fever, severe abdominal pain, or any GI bleeding which would prompt immediate return.         Reuben Likes, MD 04/20/14 2110

## 2014-04-20 NOTE — ED Notes (Signed)
General aches, nausea, vomiting, diarrhea, onset Thursday.

## 2014-10-20 ENCOUNTER — Emergency Department (HOSPITAL_COMMUNITY)
Admission: EM | Admit: 2014-10-20 | Discharge: 2014-10-20 | Disposition: A | Discharge status: Home / Self Care | Payer: Self-pay | Attending: Emergency Medicine | Admitting: Emergency Medicine

## 2014-10-20 ENCOUNTER — Encounter (HOSPITAL_COMMUNITY): Payer: Self-pay

## 2014-10-20 DIAGNOSIS — L03116 Cellulitis of left lower limb: Secondary | ICD-10-CM | POA: Insufficient documentation

## 2014-10-20 DIAGNOSIS — Z720 Tobacco use: Secondary | ICD-10-CM | POA: Insufficient documentation

## 2014-10-20 DIAGNOSIS — Z79899 Other long term (current) drug therapy: Secondary | ICD-10-CM | POA: Insufficient documentation

## 2014-10-20 MED ORDER — SULFAMETHOXAZOLE-TRIMETHOPRIM 800-160 MG PO TABS: 1.0000 | ORAL_TABLET | Freq: Two times a day (BID) | ORAL | Status: AC

## 2014-10-20 MED ORDER — CEPHALEXIN 500 MG PO CAPS: 500.0000 mg | ORAL_CAPSULE | Freq: Four times a day (QID) | ORAL | Status: DC

## 2014-10-20 NOTE — Discharge Instructions (Signed)
Take the prescribed medication as directed. Follow-up with your primary care physician for re-check in the next few days. Return to the ED for new or worsening symptoms-- worsening redness, swelling, streaking of leg, high fever, chills, etc.

## 2014-10-20 NOTE — ED Provider Notes (Signed)
CSN: [id number]     Arrival date & time 10/20/14  1425 History   First MD Initiated Contact with Patient 10/20/14 1503     Chief Complaint  Patient presents with  . Cellulitis     (Consider location/radiation/quality/duration/timing/severity/associated sxs/prior Treatment) The history is provided by the patient and medical records.    This is a 24 y.o. F with no significant PMH presenting to the ED for recurrent cellulitis of left foot.  Patient states she finished full course of clindamycin 2 weeks ago for cellulitis or dorsal left foot.  States she was seen at urgent care for that episode, negative x-rays at that time.  States she was told cellulitis was stemming from wound on her left foot which she scratched.  States wound has since healed and was initially doing ok but noticed yesterday evening that her left foot began swelling again and having some redness of dorsal aspect of foot.  Denies injury, trauma, falls.  No reported fever, chills, sweats.  No hx of MRSA, DM, or HIV.  No IVDU.  VSS.  Past Medical History  Diagnosis Date  . Medical history non-contributory    Past Surgical History  Procedure Laterality Date  . Tonsillectomy     History reviewed. No pertinent family history. History  Substance Use Topics  . Smoking status: Current Some Day Smoker -- 0.10 packs/day  . Smokeless tobacco: Not on file  . Alcohol Use: Yes   OB History    Gravida Para Term Preterm AB TAB SAB Ectopic Multiple Living   Review of Systems  Musculoskeletal: Positive for joint swelling.  Skin: Positive for color change.  All other systems reviewed and are negative.     Allergies  Review of patient's allergies indicates no known allergies.  Home Medications   Prior to Admission medications   Medication Sig Start Date End Date Taking? Authorizing Provider  amoxicillin-clavulanate (AUGMENTIN) 875-125 MG per tablet Take 1 tablet by mouth 2 (two) times daily. 02/25/14    Ozella Rocks, MD  benzonatate (TESSALON PERLES) 100 MG capsule Take 1-2 capsules (100-200 mg total) by mouth 3 (three) times daily as needed for cough. 02/25/14   Ozella Rocks, MD  diphenoxylate-atropine (LOMOTIL) 2.5-0.025 MG per tablet Take 1 tablet by mouth 4 (four) times daily as needed for diarrhea or loose stools. 04/20/14   Reuben Likes, MD  estradiol cypionate (DEPO-ESTRADIOL) 5 MG/ML injection Inject into the muscle every 28 (twenty-eight) days.    Historical Provider, MD  HYDROcodone-acetaminophen (NORCO/VICODIN) 5-325 MG per tablet Take 1 tablet by mouth every 6 (six) hours as needed for moderate pain or severe pain. 01/05/14   Fayrene Helper, PA-C  ipratropium (ATROVENT) 0.06 % nasal spray Place 2 sprays into both nostrils 4 (four) times daily. 02/25/14   Ozella Rocks, MD  levonorgestrel (MIRENA) 20 MCG/24HR IUD 1 each by Intrauterine route once.    Historical Provider, MD  ondansetron (ZOFRAN ODT) 8 MG disintegrating tablet Take 1 tablet (8 mg total) by mouth every 8 (eight) hours as needed for nausea. 04/20/14   Reuben Likes, MD   BP 120/78 mmHg  Pulse 85  Temp(Src) 98.1 F (36.7 C) (Oral)  Resp 18  Ht  (1.753 m)  Wt 265 lb (120.203 kg)  BMI 39.12 kg/m2  SpO2 97%  LMP 08/26/2014   Physical Exam  Constitutional: She is oriented to person, place, and time. She  appears well-developed and well-nourished. No distress.  HENT:  Head: Normocephalic and atraumatic.  Mouth/Throat: Oropharynx is clear and moist.  Eyes: Conjunctivae and EOM are normal. Pupils are equal, round, and reactive to light.  Neck: Normal range of motion. Neck supple.  Cardiovascular: Normal rate, regular rhythm and normal heart sounds.   Pulmonary/Chest: Effort normal and breath sounds normal. No respiratory distress. She has no wheezes.  Abdominal: Soft. Bowel sounds are normal. There is no tenderness. There is no guarding.  Musculoskeletal: Normal range of motion.  Left foot with generalized  swelling and erythema along dorsal aspect; no significant induration or streaking of leg; full ROM of ankle and all toes; strong radial pulse and cap refill; normal sensation throughout; remains ambulatory  Neurological: She is alert and oriented to person, place, and time.  Skin: Skin is warm and dry. She is not diaphoretic.  Psychiatric: She has a normal mood and affect.  Nursing note and vitals reviewed.   ED Course  Procedures (including critical care time) Labs Review Labs Reviewed - No data to display  Imaging Review No results found.   EKG Interpretation None      MDM   Final diagnoses:  Cellulitis of left foot   24 y.o. F here with recurrent cellulitis of left foot.  No hx of HIV, DM, MRSA.  Patient afebrile, nontoxic. Left foot is generally swollen with erythema along dorsal aspect. There is no significant induration or streaking up the leg. Left foot remains neurovascularly intact. Patient may have a partially treated cellulitis versus recurrence.  Doubt septic joint or clot.  She previously was on clindamycin, will change to combination of Bactrim and Keflex to ensure adequate coverage.  Patient is to follow-up with her PCP in the next few days for recheck.  Discussed plan with patient, he/she acknowledged understanding and agreed with plan of care.  Return precautions given for new or worsening symptoms.  Garlon HatchetLisa M Rosaleigh Brazzel, PA-C 10/20/14 1808  Richardean Canalavid H Yao, MD 10/20/14 [phone]

## 2014-10-20 NOTE — ED Notes (Signed)
Pt had cellulitis in left foot and took antibiotics.  Finished antibiotics two weeks ago.  Onset yesterday left foot started swelling again and top of foot and toes reddened.

## 2014-11-03 ENCOUNTER — Emergency Department (INDEPENDENT_AMBULATORY_CARE_PROVIDER_SITE_OTHER)
Admission: EM | Admit: 2014-11-03 | Discharge: 2014-11-03 | Disposition: A | Discharge status: Nursing Home (Includes ICF) | Payer: Self-pay | Source: Home / Self Care | Attending: Family Medicine | Admitting: Family Medicine

## 2014-11-03 ENCOUNTER — Encounter (HOSPITAL_COMMUNITY): Payer: Self-pay | Admitting: *Deleted

## 2014-11-03 ENCOUNTER — Emergency Department (HOSPITAL_COMMUNITY): Payer: Self-pay

## 2014-11-03 ENCOUNTER — Encounter (HOSPITAL_COMMUNITY): Payer: Self-pay | Admitting: Emergency Medicine

## 2014-11-03 ENCOUNTER — Emergency Department (HOSPITAL_COMMUNITY)
Admission: EM | Admit: 2014-11-03 | Discharge: 2014-11-03 | Disposition: A | Discharge status: Home / Self Care | Payer: Self-pay | Attending: Emergency Medicine | Admitting: Emergency Medicine

## 2014-11-03 DIAGNOSIS — M79675 Pain in left toe(s): Secondary | ICD-10-CM

## 2014-11-03 DIAGNOSIS — M79672 Pain in left foot: Secondary | ICD-10-CM | POA: Insufficient documentation

## 2014-11-03 DIAGNOSIS — Z720 Tobacco use: Secondary | ICD-10-CM | POA: Insufficient documentation

## 2014-11-03 DIAGNOSIS — Z3202 Encounter for pregnancy test, result negative: Secondary | ICD-10-CM | POA: Insufficient documentation

## 2014-11-03 DIAGNOSIS — M7989 Other specified soft tissue disorders: Secondary | ICD-10-CM

## 2014-11-03 DIAGNOSIS — M25475 Effusion, left foot: Secondary | ICD-10-CM | POA: Insufficient documentation

## 2014-11-03 DIAGNOSIS — Z79899 Other long term (current) drug therapy: Secondary | ICD-10-CM | POA: Insufficient documentation

## 2014-11-03 LAB — BASIC METABOLIC PANEL
Anion gap: 8 (ref 5–15)
BUN: 12 mg/dL (ref 6–20)
CO2: 25 mmol/L (ref 22–32)
Calcium: 8.8 mg/dL — ABNORMAL LOW (ref 8.9–10.3)
Chloride: 107 mmol/L (ref 101–111)
Creatinine, Ser: 0.89 mg/dL (ref 0.44–1.00)
Glucose, Bld: 92 mg/dL (ref 65–99)
POTASSIUM: 4.2 mmol/L (ref 3.5–5.1)
Sodium: 140 mmol/L (ref 135–145)

## 2014-11-03 LAB — CBC WITH DIFFERENTIAL/PLATELET
Basophils Absolute: 0 10*3/uL (ref 0.0–0.1)
Basophils Relative: 1 % (ref 0–1)
EOS ABS: 0.1 10*3/uL (ref 0.0–0.7)
Eosinophils Relative: 1 % (ref 0–5)
HCT: 39.7 % (ref 36.0–46.0)
Hemoglobin: 13.7 g/dL (ref 12.0–15.0)
LYMPHS ABS: 2.3 10*3/uL (ref 0.7–4.0)
LYMPHS PCT: 33 % (ref 12–46)
MCH: 31.1 pg (ref 26.0–34.0)
MCHC: 34.5 g/dL (ref 30.0–36.0)
MCV: 90.2 fL (ref 78.0–100.0)
Monocytes Absolute: 0.4 10*3/uL (ref 0.1–1.0)
Monocytes Relative: 6 % (ref 3–12)
Neutro Abs: 4 10*3/uL (ref 1.7–7.7)
Neutrophils Relative %: 59 % (ref 43–77)
PLATELETS: 248 10*3/uL (ref 150–400)
RBC: 4.4 MIL/uL (ref 3.87–5.11)
RDW: 12.3 % (ref 11.5–15.5)
WBC: 6.8 10*3/uL (ref 4.0–10.5)

## 2014-11-03 LAB — I-STAT BETA HCG BLOOD, ED (MC, WL, AP ONLY)

## 2014-11-03 MED ORDER — IBUPROFEN 800 MG PO TABS: 800.0000 mg | ORAL_TABLET | Freq: Three times a day (TID) | ORAL | Status: DC

## 2014-11-03 NOTE — ED Notes (Signed)
Patient transported to X-ray 

## 2014-11-03 NOTE — ED Notes (Signed)
Pt finished keflex yesterday to tx infection to L foot.  Went to UC today and was told to come here.

## 2014-11-03 NOTE — ED Notes (Signed)
Left foot swelling, pain and redness.  Patient reports this is not any better than it was at onset .  Patient reports being seen for the same in [place] and started on antibiotic.  Then patient was seen in [place] ed on 7/17.  And at that time, ed noted reports switching antibiotic from clindamycin to keflex and bactrim.  Finished these two days ago per patient

## 2014-11-03 NOTE — ED Provider Notes (Signed)
CSN: [id number]     Arrival date & time 11/03/14  1544 History   First MD Initiated Contact with Patient 11/03/14 1616     Chief Complaint  Patient presents with  . Recurrent Skin Infections     (Consider location/radiation/quality/duration/timing/severity/associated sxs/prior Treatment) HPI Comments: Patient is a 24 year old female who presents with left foot swelling for the past 2 weeks. Symptoms started gradually and remained constant since the onset. She denies any injury but does say the swelling started after she was on her feet more than usual for a few days. She was seen in the ED 2 weeks ago and started on Bactrim and Keflex which did not improve her pain or the swelling. Patient states she has not tried ibuprofen for pain. No new injury, increased or spreading swelling. Patient has not taken any other new medication, including clindamycin. No recent travel, immobilization, or previous PE/DVT. Patient has Mirena IUD. No oral contraceptives.    Past Medical History  Diagnosis Date  . Medical history non-contributory    Past Surgical History  Procedure Laterality Date  . Tonsillectomy     No family history on file. History  Substance Use Topics  . Smoking status: Current Some Day Smoker -- 0.10 packs/day  . Smokeless tobacco: Not on file  . Alcohol Use: Yes     Comment: occ   OB History    Gravida Para Term Preterm AB TAB SAB Ectopic Multiple Living   Review of Systems  Musculoskeletal: Positive for joint swelling.  All other systems reviewed and are negative.     Allergies  Review of patient's allergies indicates no known allergies.  Home Medications   Prior to Admission medications   Medication Sig Start Date End Date Taking? Authorizing Provider  amoxicillin-clavulanate (AUGMENTIN) 875-125 MG per tablet Take 1 tablet by mouth 2 (two) times daily. Patient not taking: Reported on 11/03/2014 02/25/14   Ozella Rocks, MD  benzonatate  (TESSALON PERLES) 100 MG capsule Take 1-2 capsules (100-200 mg total) by mouth 3 (three) times daily as needed for cough. 02/25/14   Ozella Rocks, MD  cephALEXin (KEFLEX) 500 MG capsule Take 1 capsule (500 mg total) by mouth 4 (four) times daily. Patient not taking: Reported on 11/03/2014 10/20/14   Garlon Hatchet, PA-C  diphenoxylate-atropine (LOMOTIL) 2.5-0.025 MG per tablet Take 1 tablet by mouth 4 (four) times daily as needed for diarrhea or loose stools. 04/20/14   Reuben Likes, MD  estradiol cypionate (DEPO-ESTRADIOL) 5 MG/ML injection Inject into the muscle every 28 (twenty-eight) days.    Historical Provider, MD  HYDROcodone-acetaminophen (NORCO/VICODIN) 5-325 MG per tablet Take 1 tablet by mouth every 6 (six) hours as needed for moderate pain or severe pain. 01/05/14   Fayrene Helper, PA-C  ipratropium (ATROVENT) 0.06 % nasal spray Place 2 sprays into both nostrils 4 (four) times daily. 02/25/14   Ozella Rocks, MD  levonorgestrel (MIRENA) 20 MCG/24HR IUD 1 each by Intrauterine route once.    Historical Provider, MD  ondansetron (ZOFRAN ODT) 8 MG disintegrating tablet Take 1 tablet (8 mg total) by mouth every 8 (eight) hours as needed for nausea. 04/20/14   Reuben Likes, MD   BP 139/87 mmHg  Pulse 83  Temp(Src) 98.4 F (36.9 C) (Oral)  Resp 18  Ht 5' 9.5" (1.765 m)  Wt 267 lb (121.11 kg)  BMI 38.88 kg/m2  SpO2 97%  LMP 10/28/2014 Physical Exam  Constitutional: She is oriented to person, place, and time. She appears well-developed and well-nourished. No distress.  HENT:  Head: Normocephalic and atraumatic.  Eyes: Conjunctivae are normal.  Neck: Normal range of motion.  Cardiovascular: Normal rate, regular rhythm and intact distal pulses.  Exam reveals no gallop and no friction rub.   No murmur heard. Pulmonary/Chest: Effort normal and breath sounds normal. She has no wheezes. She has no rales. She exhibits no tenderness.  Abdominal: Soft. She exhibits no distension. There is no  tenderness. There is no rebound.  Musculoskeletal: Normal range of motion.  Dorsal left foot tenderness to palpation. No obvious deformity. No open wound. No redness or warmth. No calf tenderness or lower extremity swelling.   Neurological: She is alert and oriented to person, place, and time. Coordination normal.  Speech is goal-oriented. Moves limbs without ataxia.   Skin: Skin is warm and dry.  Psychiatric: She has a normal mood and affect. Her behavior is normal.  Nursing note and vitals reviewed.   ED Course  Procedures (including critical care time) Labs Review Labs Reviewed  BASIC METABOLIC PANEL - Abnormal; Notable for the following:    Calcium 8.8 (*)    All other components within normal limits  CBC WITH DIFFERENTIAL/PLATELET  I-STAT BETA HCG BLOOD, ED ([facility], [facility], [facility] ONLY)    Imaging Review Dg Foot Complete Left  11/03/2014   CLINICAL DATA:  Left foot pain/ swelling, redness  EXAM: LEFT FOOT - COMPLETE 3+ VIEW  COMPARISON:  None.  FINDINGS: No fracture or dislocation is seen.  Mild degenerative changes at the 1st MTP joint.  Mild soft tissue swelling involving the dorsal forefoot.  IMPRESSION: No acute osseus abnormality is seen.  Mild soft tissue swelling involving the dorsal forefoot.   Electronically Signed   By: Charline Bills M.D.   On: 11/03/2014 17:26     EKG Interpretation None      MDM   Final diagnoses:  Left foot pain   6:11 PM Patient's labs and foot xray unremarkable for acute changes. The area does not appear infected. I am not concerned about a DVT in this patient due to no calf pain, tenderness or swelling. Patient will be discharged with ibuprofen and instructions to rest, ice, and elevate.    Emilia Beck, PA-C 11/03/14 1829  Blake Divine, MD 11/07/14 0700

## 2014-11-03 NOTE — Discharge Instructions (Signed)
It was nice to see you today, I am sorry about your foot swelling. It does not seem this is due to infection or maybe it has been adequately treated with all the antibiotics you already used. Let us get an ultrasound to ensure you don't have a clot. I will send you to the ED to obtain this.

## 2014-11-03 NOTE — Discharge Instructions (Signed)
Take ibuprofen as needed for pain and swelling. Rest, ice, and elevate your foot. Return to the ED with worsening or concerning symptoms.

## 2014-11-03 NOTE — ED Provider Notes (Addendum)
CSN: [id number]     Arrival date & time 11/03/14  1500 History   None    No chief complaint on file.  Left foot swelling: C/O swelling which started about 2 months ago, this is associated with pain of 7/10 in severity, worse with weight bearing.She used Ibuprofen with no improvement. It gets red and feels itchy at times. She denies any trauma to her foot. Denies similar symptoms on the right. Denies any recent plane travel, she is on OCP. She had been seen at the ED twice for this.She was started on 3 different A/B over the last few weeks. The last A/B was clindamycin, this did not help.  The history is provided by the patient. No language interpreter was used.    Past Medical History  Diagnosis Date  . Medical history non-contributory    Past Surgical History  Procedure Laterality Date  . Tonsillectomy     No family history on file. History  Substance Use Topics  . Smoking status: Current Some Day Smoker -- 0.10 packs/day  . Smokeless tobacco: Not on file  . Alcohol Use: Yes   OB History    Gravida Para Term Preterm AB TAB SAB Ectopic Multiple Living   1 1 1       1      Review of Systems  Respiratory: Negative.   Cardiovascular: Negative.   Gastrointestinal: Negative.   Musculoskeletal:       Left foot swelling  Neurological: Negative.   All other systems reviewed and are negative.   Allergies  Review of patient's allergies indicates no known allergies.  Home Medications   Prior to Admission medications   Medication Sig Start Date End Date Taking? Authorizing Provider  amoxicillin-clavulanate (AUGMENTIN) 875-125 MG per tablet Take 1 tablet by mouth 2 (two) times daily. 02/25/14   Ozella Rocks, MD  benzonatate (TESSALON PERLES) 100 MG capsule Take 1-2 capsules (100-200 mg total) by mouth 3 (three) times daily as needed for cough. 02/25/14   Ozella Rocks, MD  cephALEXin (KEFLEX) 500 MG capsule Take 1 capsule (500 mg total) by mouth 4 (four) times daily. 10/20/14    Garlon Hatchet, PA-C  diphenoxylate-atropine (LOMOTIL) 2.5-0.025 MG per tablet Take 1 tablet by mouth 4 (four) times daily as needed for diarrhea or loose stools. 04/20/14   Reuben Likes, MD  estradiol cypionate (DEPO-ESTRADIOL) 5 MG/ML injection Inject into the muscle every 28 (twenty-eight) days.    Historical Provider, MD  HYDROcodone-acetaminophen (NORCO/VICODIN) 5-325 MG per tablet Take 1 tablet by mouth every 6 (six) hours as needed for moderate pain or severe pain. 01/05/14   Fayrene Helper, PA-C  ipratropium (ATROVENT) 0.06 % nasal spray Place 2 sprays into both nostrils 4 (four) times daily. 02/25/14   Ozella Rocks, MD  levonorgestrel (MIRENA) 20 MCG/24HR IUD 1 each by Intrauterine route once.    Historical Provider, MD  ondansetron (ZOFRAN ODT) 8 MG disintegrating tablet Take 1 tablet (8 mg total) by mouth every 8 (eight) hours as needed for nausea. 04/20/14   Reuben Likes, MD   LMP 08/26/2014 Physical Exam  Constitutional: She appears well-developed. No distress.  Cardiovascular: Normal rate, regular rhythm and normal heart sounds.   No murmur heard. Pulmonary/Chest: Effort normal and breath sounds normal. No respiratory distress. She has no wheezes.  Musculoskeletal: Normal range of motion.       Feet:  Nursing note and vitals reviewed.   ED Course  Procedures (including critical care time) Labs  Review Labs Reviewed - No data to display  Imaging Review No results found.   MDM  No diagnosis found. Left foot swelling: S/P antibiotic treatment. R/O DVT. Transfer to ED for duplex U/S. Ibuprofen recommended prn pain. Wear compression stockings for swelling.     Doreene Eland, MD 11/03/14 1534  Doreene Eland, MD 11/03/14 1535

## 2015-04-18 ENCOUNTER — Encounter (HOSPITAL_COMMUNITY): Payer: Self-pay | Admitting: *Deleted

## 2015-04-18 ENCOUNTER — Emergency Department (HOSPITAL_COMMUNITY)
Admission: EM | Admit: 2015-04-18 | Discharge: 2015-04-18 | Discharge status: Against Medical Advice | Payer: Self-pay | Attending: Emergency Medicine | Admitting: Emergency Medicine

## 2015-04-18 ENCOUNTER — Encounter (HOSPITAL_COMMUNITY): Payer: Self-pay

## 2015-04-18 ENCOUNTER — Emergency Department (HOSPITAL_COMMUNITY)
Admission: EM | Admit: 2015-04-18 | Discharge: 2015-04-18 | Disposition: A | Discharge status: Home / Self Care | Payer: Self-pay | Attending: Emergency Medicine | Admitting: Emergency Medicine

## 2015-04-18 DIAGNOSIS — F17210 Nicotine dependence, cigarettes, uncomplicated: Secondary | ICD-10-CM | POA: Insufficient documentation

## 2015-04-18 DIAGNOSIS — G43909 Migraine, unspecified, not intractable, without status migrainosus: Secondary | ICD-10-CM | POA: Insufficient documentation

## 2015-04-18 DIAGNOSIS — Z79899 Other long term (current) drug therapy: Secondary | ICD-10-CM | POA: Insufficient documentation

## 2015-04-18 DIAGNOSIS — R200 Anesthesia of skin: Secondary | ICD-10-CM | POA: Insufficient documentation

## 2015-04-18 DIAGNOSIS — R51 Headache: Secondary | ICD-10-CM | POA: Insufficient documentation

## 2015-04-18 HISTORY — DX: Migraine, unspecified, not intractable, without status migrainosus: G43.909

## 2015-04-18 MED ORDER — SODIUM CHLORIDE 0.9 % IV BOLUS (SEPSIS)
1000.0000 mL | Freq: Once | INTRAVENOUS | Status: AC
  Administered 2015-04-18: 1000 mL via INTRAVENOUS

## 2015-04-18 MED ORDER — METHYLPREDNISOLONE SODIUM SUCC 125 MG IJ SOLR
125.0000 mg | Freq: Once | INTRAMUSCULAR | Status: AC
  Administered 2015-04-18: 125 mg via INTRAVENOUS
  Filled 2015-04-18: qty 2

## 2015-04-18 MED ORDER — KETOROLAC TROMETHAMINE 30 MG/ML IJ SOLN
30.0000 mg | Freq: Once | INTRAMUSCULAR | Status: AC
  Administered 2015-04-18: 30 mg via INTRAVENOUS
  Filled 2015-04-18: qty 1

## 2015-04-18 MED ORDER — METOCLOPRAMIDE HCL 5 MG/ML IJ SOLN
10.0000 mg | Freq: Once | INTRAMUSCULAR | Status: AC
  Administered 2015-04-18: 10 mg via INTRAVENOUS
  Filled 2015-04-18: qty 2

## 2015-04-18 MED ORDER — DIPHENHYDRAMINE HCL 50 MG/ML IJ SOLN
25.0000 mg | Freq: Once | INTRAMUSCULAR | Status: AC
  Administered 2015-04-18: 25 mg via INTRAVENOUS
  Filled 2015-04-18: qty 1

## 2015-04-18 MED ORDER — METOCLOPRAMIDE HCL 10 MG PO TABS: 10.0000 mg | ORAL_TABLET | Freq: Three times a day (TID) | ORAL | Status: DC | PRN

## 2015-04-18 NOTE — ED Notes (Signed)
Pt states that she woke up around 3:30 am with a headache; pt states that she took a Fiorcet for the pain; pt states then when she got up she felt numbness to her mouth, tongue and throat; pt states that it only lasted a few minutes; pt denies numbness currently; pt states that she still currently has a headache; pt states that she took Fiorcet in the past without complication

## 2015-04-18 NOTE — ED Notes (Signed)
No answer when pt's name called in the waiting room 

## 2015-04-18 NOTE — ED Provider Notes (Signed)
CSN: [id number]     Arrival date & time 04/18/15  0846 History   First MD Initiated Contact with Patient 04/18/15 0914     Chief Complaint  Patient presents with  . Headache     (Consider location/radiation/quality/duration/timing/severity/associated sxs/prior Treatment) HPI Patient presents with right siding throbbing headache that radiates to the back of her head. It woke her at 3 AM. She's had photophobia and phonophobia with this. Also complains of nausea without vomiting. Patient had left arm tingling that radiated up to her mouth and tongue. This is currently resolved. Patient took Fioricet without significant relief. She has a history of migraines just never been to see a neurologist. She's had numbness and tingling associated with her migraines in the past. She denies any focal weakness or visual changes. No neck pain or stiffness. Past Medical History  Diagnosis Date  . Medical history non-contributory   . Migraines    Past Surgical History  Procedure Laterality Date  . Tonsillectomy     No family history on file. Social History  Substance Use Topics  . Smoking status: Current Some Day Smoker -- 0.50 packs/day    Types: Cigarettes  . Smokeless tobacco: None  . Alcohol Use: Yes     Comment: occ   OB History    Gravida Para Term Preterm AB TAB SAB Ectopic Multiple Living   1 1 1       1      Review of Systems  Constitutional: Negative for fever and chills.  HENT: Negative for sinus pressure.   Eyes: Positive for photophobia. Negative for visual disturbance.  Respiratory: Negative for shortness of breath.   Cardiovascular: Negative for chest pain.  Gastrointestinal: Positive for nausea. Negative for vomiting, abdominal pain and diarrhea.  Genitourinary: Negative for dysuria and flank pain.  Musculoskeletal: Negative for myalgias, back pain, neck pain and neck stiffness.  Skin: Negative for rash and wound.  Neurological: Positive for numbness and headaches. Negative  for dizziness, syncope, weakness and light-headedness.  All other systems reviewed and are negative.     Allergies  Review of patient's allergies indicates no known allergies.  Home Medications   Prior to Admission medications   Medication Sig Start Date End Date Taking? Authorizing Provider  estradiol cypionate (DEPO-ESTRADIOL) 5 MG/ML injection Inject into the muscle every 28 (twenty-eight) days.   Yes Historical Provider, MD  amoxicillin-clavulanate (AUGMENTIN) 875-125 MG per tablet Take 1 tablet by mouth 2 (two) times daily. Patient not taking: Reported on 11/03/2014 02/25/14   Ozella Rocks, MD  benzonatate (TESSALON PERLES) 100 MG capsule Take 1-2 capsules (100-200 mg total) by mouth 3 (three) times daily as needed for cough. Patient not taking: Reported on 04/18/2015 02/25/14   Ozella Rocks, MD  cephALEXin (KEFLEX) 500 MG capsule Take 1 capsule (500 mg total) by mouth 4 (four) times daily. Patient not taking: Reported on 11/03/2014 10/20/14   Garlon Hatchet, PA-C  diphenoxylate-atropine (LOMOTIL) 2.5-0.025 MG per tablet Take 1 tablet by mouth 4 (four) times daily as needed for diarrhea or loose stools. Patient not taking: Reported on 04/18/2015 04/20/14   Reuben Likes, MD  HYDROcodone-acetaminophen (NORCO/VICODIN) 5-325 MG per tablet Take 1 tablet by mouth every 6 (six) hours as needed for moderate pain or severe pain. Patient not taking: Reported on 04/18/2015 01/05/14   Fayrene Helper, PA-C  ibuprofen (ADVIL,MOTRIN) 800 MG tablet Take 1 tablet (800 mg total) by mouth 3 (three) times daily. Patient not taking: Reported on 04/18/2015 11/03/14  Kaitlyn Szekalski, PA-C  ipratropium (ATROVENT) 0.06 % nasal spray Place 2 sprays into both nostrils 4 (four) times daily. Patient not taking: Reported on 04/18/2015 02/25/14   Ozella Rocksavid J Merrell, MD  levonorgestrel [facility](MIRENA) 20 MCG/24HR IUD 1 each by Intrauterine route once.    Historical Provider, MD  metoCLOPramide (REGLAN) 10 MG tablet Take 1  tablet (10 mg total) by mouth every 8 (eight) hours as needed for nausea (headache). 04/18/15   Loren Raceravid Calvyn Kurtzman, MD  ondansetron (ZOFRAN ODT) 8 MG disintegrating tablet Take 1 tablet (8 mg total) by mouth every 8 (eight) hours as needed for nausea. Patient not taking: Reported on 04/18/2015 04/20/14   Reuben Likesavid C Keller, MD   BP 105/64 mmHg  Pulse 74  Temp(Src) 98.3 F (36.8 C) (Oral)  Resp 14  Ht 5\' 9"  (1.753 m)  Wt 288 lb (130.636 kg)  BMI 42.51 kg/m2  SpO2 96%  LMP 11/20/2014 Physical Exam  Constitutional: She is oriented to person, place, and time. She appears well-developed and well-nourished. No distress.  HENT:  Head: Normocephalic and atraumatic.  Mouth/Throat: Oropharynx is clear and moist. No oropharyngeal exudate.  No sinus tenderness to percussion.  Eyes: EOM are normal. Pupils are equal, round, and reactive to light.  Neck: Normal range of motion. Neck supple.  No meningismus  Cardiovascular: Normal rate and regular rhythm.  Exam reveals no gallop and no friction rub.   No murmur heard. Pulmonary/Chest: Effort normal and breath sounds normal. No respiratory distress. She has no wheezes. She has no rales. She exhibits no tenderness.  Abdominal: Soft. Bowel sounds are normal. She exhibits no distension and no mass. There is no tenderness. There is no rebound and no guarding.  Musculoskeletal: Normal range of motion. She exhibits no edema or tenderness.  No lower extremity swelling or pain.  Neurological: She is alert and oriented to person, place, and time.  Patient is alert and oriented x3 with clear, goal oriented speech. Patient has 5/5 motor in all extremities. Sensation is intact to light touch. Bilateral finger-to-nose is normal with no signs of dysmetria. Patient has a normal gait and walks without assistance.  Skin: Skin is warm and dry. No rash noted. No erythema.  Psychiatric: She has a normal mood and affect. Her behavior is normal.  Nursing note and vitals  reviewed.   ED Course  Procedures (including critical care time) Labs Review Labs Reviewed - No data to display  Imaging Review No results found. I have personally reviewed and evaluated these images and lab results as part of my medical decision-making.   EKG Interpretation None      MDM   Final diagnoses:  Migraine without status migrainosus, not intractable, unspecified migraine type    Patient with migraine symptoms running at 3 AM. These are typical of her chronic migraines which she states she gets every 2 months. She initially had numbness and tingling that has resolved. She's also had these symptoms in the past with her headaches. We'll treat with migraine cocktail and reevaluate. Her initial neurologic evaluation is normal. Do not believe that imaging is necessary at this time.  Patient's headache has resolved. We'll discharge home to follow-up with neurology. Return precautions given.  Loren Raceravid Tyera Hansley, MD 04/18/15 1151

## 2015-04-18 NOTE — ED Notes (Signed)
No answer when pt's name called in the waiting room; unable to locate pt 

## 2015-04-18 NOTE — Discharge Instructions (Signed)

## 2015-04-18 NOTE — ED Notes (Signed)
Patient here with right sided headache, radiation to back of head, took tylenol with a little relief and positive photophobia, denies trauma

## 2015-09-02 ENCOUNTER — Encounter (HOSPITAL_COMMUNITY): Payer: Self-pay | Admitting: Emergency Medicine

## 2015-09-02 ENCOUNTER — Emergency Department (HOSPITAL_COMMUNITY)
Admission: EM | Admit: 2015-09-02 | Discharge: 2015-09-02 | Disposition: A | Discharge status: Home / Self Care | Payer: Self-pay | Attending: Emergency Medicine | Admitting: Emergency Medicine

## 2015-09-02 ENCOUNTER — Emergency Department (HOSPITAL_COMMUNITY): Payer: Self-pay

## 2015-09-02 DIAGNOSIS — F17210 Nicotine dependence, cigarettes, uncomplicated: Secondary | ICD-10-CM | POA: Insufficient documentation

## 2015-09-02 DIAGNOSIS — Z792 Long term (current) use of antibiotics: Secondary | ICD-10-CM | POA: Insufficient documentation

## 2015-09-02 DIAGNOSIS — Z791 Long term (current) use of non-steroidal anti-inflammatories (NSAID): Secondary | ICD-10-CM | POA: Insufficient documentation

## 2015-09-02 DIAGNOSIS — Z79899 Other long term (current) drug therapy: Secondary | ICD-10-CM | POA: Insufficient documentation

## 2015-09-02 DIAGNOSIS — G43109 Migraine with aura, not intractable, without status migrainosus: Secondary | ICD-10-CM | POA: Insufficient documentation

## 2015-09-02 LAB — CBG MONITORING, ED
GLUCOSE-CAPILLARY: 64 mg/dL — AB (ref 65–99)
GLUCOSE-CAPILLARY: 93 mg/dL (ref 65–99)
Glucose-Capillary: 83 mg/dL (ref 65–99)

## 2015-09-02 LAB — POC URINE PREG, ED: Preg Test, Ur: NEGATIVE

## 2015-09-02 MED ORDER — DEXAMETHASONE SODIUM PHOSPHATE 10 MG/ML IJ SOLN
10.0000 mg | Freq: Once | INTRAMUSCULAR | Status: AC
  Administered 2015-09-02: 10 mg via INTRAVENOUS
  Filled 2015-09-02: qty 1

## 2015-09-02 MED ORDER — GADOBENATE DIMEGLUMINE 529 MG/ML IV SOLN
20.0000 mL | Freq: Once | INTRAVENOUS | Status: AC | PRN
  Administered 2015-09-02: 20 mL via INTRAVENOUS

## 2015-09-02 MED ORDER — DEXTROSE 50 % IV SOLN: 25.0000 mL | Freq: Once | INTRAVENOUS | Status: DC

## 2015-09-02 MED ORDER — DIPHENHYDRAMINE HCL 50 MG/ML IJ SOLN
25.0000 mg | Freq: Once | INTRAMUSCULAR | Status: AC
  Administered 2015-09-02: 25 mg via INTRAVENOUS
  Filled 2015-09-02: qty 1

## 2015-09-02 MED ORDER — METOCLOPRAMIDE HCL 5 MG/ML IJ SOLN
10.0000 mg | Freq: Once | INTRAMUSCULAR | Status: AC
  Administered 2015-09-02: 10 mg via INTRAVENOUS
  Filled 2015-09-02: qty 2

## 2015-09-02 MED ORDER — SODIUM CHLORIDE 0.9 % IV BOLUS (SEPSIS)
1000.0000 mL | Freq: Once | INTRAVENOUS | Status: AC
  Administered 2015-09-02: 1000 mL via INTRAVENOUS

## 2015-09-02 NOTE — ED Notes (Signed)
MD at bedside. 

## 2015-09-02 NOTE — ED Notes (Signed)
Pt complaining of Numbness and tingling sensation in Left arm.

## 2015-09-02 NOTE — ED Notes (Signed)
Pt given juice and crackers per dr Vanessa Kickorourke.

## 2015-09-02 NOTE — ED Notes (Signed)
Pt sts blurry vision and nausea in left eye starting 1 hour ago

## 2015-09-02 NOTE — ED Provider Notes (Signed)
CSN: [id number]     Arrival date & time 09/02/15  1558 History   First MD Initiated Contact with Patient 09/02/15 1638     Chief Complaint  Patient presents with  . Blurred Vision     (Consider location/radiation/quality/duration/timing/severity/associated sxs/prior Treatment) Patient is a 25 y.o. female presenting with general illness. The history is provided by the patient.  Illness Location:  Neurological complaint Severity:  Moderate Onset quality:  Gradual Duration:  1 hour Timing:  Constant Progression:  Improving Chronicity:  New Context:  History of migraines. Normally gets nauseated before onset of headache. Had onset of nausea, and then left eye blurred vision. EMS called. On arrival here had left-sided numbness and tingling. That is now resolved. Is now having onset of headache that feels like prior migraines. Associated symptoms: headaches   Associated symptoms: no abdominal pain, no chest pain, no diarrhea, no fever, no nausea, no shortness of breath and no vomiting     Past Medical History  Diagnosis Date  . Medical history non-contributory   . Migraines    Past Surgical History  Procedure Laterality Date  . Tonsillectomy     History reviewed. No pertinent family history. Social History  Substance Use Topics  . Smoking status: Current Some Day Smoker -- 0.50 packs/day    Types: Cigarettes  . Smokeless tobacco: None  . Alcohol Use: Yes     Comment: occ   OB History    Gravida Para Term Preterm AB TAB SAB Ectopic Multiple Living   Review of Systems  Constitutional: Negative for fever and chills.  Eyes: Positive for visual disturbance. Negative for pain.  Respiratory: Negative for shortness of breath.   Cardiovascular: Negative for chest pain.  Gastrointestinal: Negative for nausea, vomiting, abdominal pain and diarrhea.  Genitourinary: Negative for dysuria, vaginal bleeding and vaginal discharge.  Musculoskeletal: Negative for back  pain and neck stiffness.  Neurological: Positive for headaches. Negative for syncope, facial asymmetry, speech difficulty, weakness and light-headedness.       Left face and left upper extremity paresthesias.  All other systems reviewed and are negative.     Allergies  Review of patient's allergies indicates no known allergies.  Home Medications   Prior to Admission medications   Medication Sig Start Date End Date Taking? Authorizing Provider  acetaminophen (TYLENOL) 500 MG tablet Take 500-1,000 mg by mouth every 6 (six) hours as needed for headache.   Yes Historical Provider, MD  citalopram (CELEXA) 10 MG tablet Take 10 mg by mouth daily. 08/23/15 08/22/16 Yes Historical Provider, MD  ibuprofen (ADVIL,MOTRIN) 200 MG tablet Take 200-800 mg by mouth every 6 (six) hours as needed for headache.   Yes Historical Provider, MD  LORazepam (ATIVAN) 1 MG tablet Take 1 mg by mouth 3 (three) times daily as needed for anxiety.  08/23/15 09/02/15 Yes Historical Provider, MD  medroxyPROGESTERone (DEPO-PROVERA) 150 MG/ML injection Inject 150 mg into the muscle every 3 (three) months.   Yes Historical Provider, MD  Multiple Vitamins-Calcium (ONE-A-DAY WOMENS PO) Take 1 tablet by mouth daily after breakfast.   Yes Historical Provider, MD  oxymetazoline (AFRIN) 0.05 % nasal spray Place 1 spray into both nostrils 2 (two) times daily as needed for congestion.   Yes Historical Provider, MD  amoxicillin-clavulanate (AUGMENTIN) 875-125 MG per tablet Take 1 tablet by mouth 2 (two) times daily. Patient not taking: Reported on 11/03/2014 02/25/14   Ozella Rocks, MD  benzonatate (TESSALON PERLES) 100 MG capsule Take 1-2 capsules (100-200 mg total) by mouth 3 (three) times daily as needed for cough. Patient not taking: Reported on 04/18/2015 02/25/14   Ozella Rocks, MD  butalbital-acetaminophen-caffeine (FIORICET) [phone] MG tablet Take 1 tablet by mouth every 6 (six) hours as needed for headache or migraine.   02/26/15 02/26/16  Historical Provider, MD  cephALEXin (KEFLEX) 500 MG capsule Take 1 capsule (500 mg total) by mouth 4 (four) times daily. Patient not taking: Reported on 11/03/2014 10/20/14   Garlon Hatchet, PA-C  diphenoxylate-atropine (LOMOTIL) 2.5-0.025 MG per tablet Take 1 tablet by mouth 4 (four) times daily as needed for diarrhea or loose stools. Patient not taking: Reported on 04/18/2015 04/20/14   Reuben Likes, MD  HYDROcodone-acetaminophen (NORCO/VICODIN) 5-325 MG per tablet Take 1 tablet by mouth every 6 (six) hours as needed for moderate pain or severe pain. Patient not taking: Reported on 04/18/2015 01/05/14   Fayrene Helper, PA-C  ibuprofen (ADVIL,MOTRIN) 800 MG tablet Take 1 tablet (800 mg total) by mouth 3 (three) times daily. Patient not taking: Reported on 04/18/2015 11/03/14   Emilia Beck, PA-C  ipratropium (ATROVENT) 0.06 % nasal spray Place 2 sprays into both nostrils 4 (four) times daily. Patient not taking: Reported on 04/18/2015 02/25/14   Ozella Rocks, MD  metoCLOPramide (REGLAN) 10 MG tablet Take 1 tablet (10 mg total) by mouth every 8 (eight) hours as needed for nausea (headache). 04/18/15   Loren Racer, MD  ondansetron (ZOFRAN ODT) 8 MG disintegrating tablet Take 1 tablet (8 mg total) by mouth every 8 (eight) hours as needed for nausea. Patient not taking: Reported on 04/18/2015 04/20/14   Reuben Likes, MD   BP 127/70 mmHg  Pulse 58  Temp(Src) 98.3 F (36.8 C) (Oral)  Resp 19  Wt 127.007 kg  SpO2 100% Physical Exam  Constitutional: She is oriented to person, place, and time. She appears well-developed and well-nourished. No distress.  HENT:  Head: Normocephalic and atraumatic.  Eyes: EOM are normal. Pupils are equal, round, and reactive to light.  Neck: Normal range of motion. No spinous process tenderness present. No rigidity. Normal range of motion present.  Cardiovascular: Normal rate and regular rhythm.   Pulmonary/Chest: No tachypnea. No respiratory  distress.  Abdominal: Soft. Normal appearance. There is no tenderness.  Musculoskeletal:       Right lower leg: She exhibits no swelling and no edema.       Left lower leg: She exhibits no swelling and no edema.  Neurological: She is alert and oriented to person, place, and time. She has normal strength. No cranial nerve deficit or sensory deficit. Coordination and gait normal. GCS eye subscore is 4. GCS verbal subscore is 5. GCS motor subscore is 6.  Skin: Skin is warm and dry.    ED Course  Procedures (including critical care time) Labs Review Labs Reviewed  CBG MONITORING, ED - Abnormal; Notable for the following:    Glucose-Capillary 64 (*)    All other components within normal limits  CBC WITH DIFFERENTIAL/PLATELET  BASIC METABOLIC PANEL  CBG MONITORING, ED  POC URINE PREG, ED  CBG MONITORING, ED    Imaging Review Mr Lodema Pilot Contrast  09/02/2015  CLINICAL DATA:  25 year old female with left upper extremity numbness and tingling of acute onset today. Dizziness. Initial encounter. EXAM: MRI HEAD WITHOUT AND WITH CONTRAST TECHNIQUE: Multiplanar, multiecho pulse sequences of the brain and surrounding structures were obtained without and with intravenous  contrast. CONTRAST:  20mL MULTIHANCE GADOBENATE DIMEGLUMINE 529 MG/ML IV SOLN COMPARISON:  None. FINDINGS: Cerebral volume is normal. No restricted diffusion to suggest acute infarction. No midline shift, mass effect, evidence of mass lesion, ventriculomegaly, extra-axial collection or acute intracranial hemorrhage. Cervicomedullary junction and pituitary are within normal limits. Negative visualized cervical spine. Major intracranial vascular flow voids are preserved. Wallace CullensGray and white matter signal is within normal limits throughout the brain. No abnormal enhancement identified. No dural thickening. Visualized internal auditory structures appear within normal limits. Mastoids are clear. Subtotal opacification of the left maxillary sinus  with both mucosal thickening and fluid. Pneumatized left anterior clinoid process incidentally noted. Mild mucosal thickening in left frontal and anterior ethmoid sinuses. Orbit and scalp soft tissues are negative. Visualized bone marrow signal is within normal limits. IMPRESSION: 1.  Normal MRI appearance of the brain. 2. Left paranasal sinusitis, primarily affecting the left maxillary sinus. Electronically Signed   By: Odessa FlemingH  Hall M.D.   On: 09/02/2015 20:57   I have personally reviewed and evaluated these images and lab results as part of my medical decision-making.   EKG Interpretation None      MDM   Final diagnoses:  Migraine with aura and without status migrainosus, not intractable    25 year old female with a history of migraines presenting with likely a complex migraine. Neurological complains of left eye changes in vision and left upper extremity and left side of face paresthesias have now resolved. Now has a headache that is consistent with prior migraines. She does mention having intermittent episodes like this in the last few months. Does mention a component of vertigo as well.   No focal neurological deficits on exam. No personal or family history of clotting disorders. No risk factors for vasculopathy. Got an MRI of her head - no evidence of acute abnormalities such as CVA or MS.There was evidence of left-sided sinusitis. Encouraged her to take decongestant at home.  Denies fevers and chills. Does not have any meningeal signs. Doubt meningitis or encephalitis. Vital signs are normal and stable. Blood glucose is within normal limits. Pregnancy test negative.  Gave the patient a migraine cocktail with improvement in symptoms. Patient able annually throughout the emergency department without issue. Continues to report resolution of initial neurological complaints.  Strict return precautions provided. Encouraged her to follow up with her primary care doctor as needed. Provided her  information to contact neurology should she desire.  Patient discharged in stable condition.  Lindalou HoseSean O'Rourke, MD 09/02/15 2121  Loren Raceravid Yelverton, MD 09/02/15 [phone]

## 2015-09-02 NOTE — ED Notes (Signed)
Discharge instructions reviewed - voiced understanding.  Will follow up with neurology as directed

## 2015-12-02 ENCOUNTER — Ambulatory Visit (INDEPENDENT_AMBULATORY_CARE_PROVIDER_SITE_OTHER): Payer: Self-pay | Admitting: Internal Medicine

## 2015-12-02 ENCOUNTER — Telehealth: Payer: Self-pay | Admitting: Licensed Clinical Social Worker

## 2015-12-02 ENCOUNTER — Encounter: Payer: Self-pay | Admitting: Internal Medicine

## 2015-12-02 VITALS — BP 124/78 | HR 70 | Temp 98.3°F | Resp 16 | Ht 66.25 in | Wt 283.0 lb

## 2015-12-02 DIAGNOSIS — Z658 Other specified problems related to psychosocial circumstances: Secondary | ICD-10-CM

## 2015-12-02 DIAGNOSIS — E669 Obesity, unspecified: Secondary | ICD-10-CM

## 2015-12-02 DIAGNOSIS — G43109 Migraine with aura, not intractable, without status migrainosus: Secondary | ICD-10-CM

## 2015-12-02 DIAGNOSIS — F410 Panic disorder [episodic paroxysmal anxiety] without agoraphobia: Secondary | ICD-10-CM

## 2015-12-02 DIAGNOSIS — F439 Reaction to severe stress, unspecified: Secondary | ICD-10-CM

## 2015-12-02 DIAGNOSIS — R030 Elevated blood-pressure reading, without diagnosis of hypertension: Secondary | ICD-10-CM

## 2015-12-02 DIAGNOSIS — IMO0001 Reserved for inherently not codable concepts without codable children: Secondary | ICD-10-CM

## 2015-12-02 MED ORDER — CITALOPRAM HYDROBROMIDE 10 MG PO TABS: 10.0000 mg | ORAL_TABLET | Freq: Every day | ORAL | 11 refills | Status: DC

## 2015-12-02 MED ORDER — VERAPAMIL HCL ER 120 MG PO TBCR: 120.0000 mg | EXTENDED_RELEASE_TABLET | Freq: Every day | ORAL | 11 refills | Status: DC

## 2015-12-02 MED ORDER — RIZATRIPTAN BENZOATE 5 MG PO TBDP: ORAL_TABLET | ORAL | 11 refills | Status: DC

## 2015-12-02 MED ORDER — RIZATRIPTAN BENZOATE 5 MG PO TBDP: 5.0000 mg | ORAL_TABLET | ORAL | 11 refills | Status: DC | PRN

## 2015-12-02 NOTE — Patient Instructions (Signed)
Drink a glass of water before every meal Drink 6-8 glasses of water daily Eat three meals daily Eat a protein and healthy fat with every meal (eggs,fish, chicken, turkey and limit red meats) Eat 5 servings of vegetables daily, mix the colors Eat 2 servings of fruit daily with skin, if skin is edible Use smaller plates Put food/utensils down as you chew and swallow each bite Eat at a table with friends/family at least once daily, no TV Do not eat in front of the TV 

## 2015-12-02 NOTE — Progress Notes (Signed)
Subjective:    Patient ID: Kylie Bailey, female    DOB: [birth year], 25 y.o.   MRN: [id number]  HPI   New to establish Has not met with Macie Burows, LCSW yet.    Would like a CPE without pap:  Had pap at beginning of year.  1.  Elevated BP:  Has not had health coverage since 25 yo.  Was told it was high in past, especially at [facility] with Depoprovera.  Was told she needs to get checked for DM.    2.  Obesity:  Used to weight 370 lbs.  Lost weight while she was pregnant.  Daughter now 16 1/2 years old.  Has maintained her weight loss, but not really living a healthy lifestyle.  3.  Migraines:  First migraine in high school.    Does have weird shapes in unilateral vision before headache.  Had blurry vision and slurred speech.  Headache is generally on the opposite side of weird vision.  Gets numbness and coolness in unilateral hand.  Numbness into one side of face and tongue.  + nausea, but no vomiting, + photophobia, + phonophobia. Does get diarrhea. Generally headache lasts 1-4 days.  Ibuprofen and Tylenol help a little, but not a lot.   Has been treated with Fioricet and does help. Reglan for nausea. Ativan.  Neurologic symptoms resolve after about 2 hours. NOrmal MRI of brain 09/02/2015 Having migraines about twice weekly. Lot of stress.  May have intermittent panic attacks Pt. Getting Dep provera every 3 months at [facility] Just received last month.   Current Meds  Medication Sig  . acetaminophen (TYLENOL) 500 MG tablet Take 500-1,000 mg by mouth every 6 (six) hours as needed for headache.  . citalopram (CELEXA) 10 MG tablet Take 1 tablet (10 mg total) by mouth daily.  Marland Kitchen ibuprofen (ADVIL,MOTRIN) 200 MG tablet Take 200-800 mg by mouth every 6 (six) hours as needed for headache.  . medroxyPROGESTERone (DEPO-PROVERA) 150 MG/ML injection Inject 150 mg into the muscle every 3 (three) months.  Marland Kitchen oxymetazoline (AFRIN) 0.05 % nasal spray Place 1 spray into both nostrils 2 (two) times daily as  needed for congestion.  . [DISCONTINUED] citalopram (CELEXA) 10 MG tablet Take 10 mg by mouth daily.   No Known Allergies   Past Medical History:  Diagnosis Date  . Medical history non-contributory   . Migraines    Past Surgical History:  Procedure Laterality Date  . TONSILLECTOMY      No family history on file.   Social History   Social History  . Marital status: Married    Spouse name: N/A  . Number of children: N/A  . Years of education: N/A   Occupational History  . Not on file.   Social History Main Topics  . Smoking status: Current Every Day Smoker    Packs/day: 0.50    Types: Cigarettes  . Smokeless tobacco: Never Used  . Alcohol use No     Comment: occ  . Drug use: No  . Sexual activity: Yes    Birth control/ protection: Injection   Other Topics Concern  . Not on file   Social History Narrative  . No narrative on file    Depression screen [facility] 2/9 12/02/2015  Decreased Interest 0  Down, Depressed, Hopeless 0  PHQ - 2 Score 0  Altered sleeping 0  Tired, decreased energy 1  Change in appetite 3  Feeling bad or failure about yourself  0  Trouble concentrating 1  Moving slowly or fidgety/restless 0  Suicidal thoughts 0  PHQ-9 Score 5     Review of Systems     Objective:   Physical Exam Obese, NAD  HEENT:  PERRL, EOMI, Discs Sharp, TMs pearly gray, throat without injection Neck:  Supple, no adenopathy, no thyromegaly Chest:  CTA CV:  RRR with normal  S1 and S2, No S3, S4 or murmur, No carotid bruits, carotid, radial DP pulses normal and equal Abd;  S, NT, No HSM or mass, + BS LE:  No definite edema, legs obese. Neuro:  A & O x 3, CN II-XII grossly intact, DTRs 2+/4 throughout, Motor 5/5 throughout, sensory grossly normal.  Normal gait and coordination.       Assessment & Plan:  1.  Elevated BP:  Fine today.  2.  Migraine Headaches:  Frequent enough to warrant regular preventive medication.  Start low dose Verapamil SR 120 mg  daily. BP and pulse check in 1 week.  F/U with me in 2 months  3.  Obesity:  Long discussion for patient and family to work on lifestyle changes, including how they eat, diet and physical activity.  Will obtain labs at next visit.  4.  Stress/Panic Attacks:  Citalopram 10 mg daily renewed.  Referral to N. KNight, LCSW    4.

## 2015-12-02 NOTE — Telephone Encounter (Signed)
LCSW called pt to introduce social work services at the clinic. 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 She shared that she is going to start exercising as an outlet for her stress. She declined counseling at this time.

## 2015-12-09 ENCOUNTER — Ambulatory Visit: Payer: Self-pay

## 2015-12-09 VITALS — BP 136/80 | HR 76

## 2015-12-09 DIAGNOSIS — IMO0001 Reserved for inherently not codable concepts without codable children: Secondary | ICD-10-CM

## 2015-12-09 DIAGNOSIS — R030 Elevated blood-pressure reading, without diagnosis of hypertension: Principal | ICD-10-CM

## 2015-12-10 ENCOUNTER — Ambulatory Visit (INDEPENDENT_AMBULATORY_CARE_PROVIDER_SITE_OTHER): Payer: Self-pay | Admitting: Internal Medicine

## 2015-12-10 ENCOUNTER — Encounter: Payer: Self-pay | Admitting: Internal Medicine

## 2015-12-10 VITALS — BP 136/80 | HR 62 | Resp 16 | Ht 68.5 in | Wt 284.0 lb

## 2015-12-10 DIAGNOSIS — G43109 Migraine with aura, not intractable, without status migrainosus: Secondary | ICD-10-CM

## 2015-12-10 DIAGNOSIS — X12XXXA Contact with other hot fluids, initial encounter: Secondary | ICD-10-CM

## 2015-12-10 DIAGNOSIS — T300 Burn of unspecified body region, unspecified degree: Secondary | ICD-10-CM

## 2015-12-10 MED ORDER — AMOXICILLIN-POT CLAVULANATE 875-125 MG PO TABS: 1.0000 | ORAL_TABLET | Freq: Two times a day (BID) | ORAL | 0 refills | Status: DC

## 2015-12-10 NOTE — Patient Instructions (Signed)
May wash in shower with lukewarm to cool water.  Okay to allow soapy water to wash over the wounds  May also soak in lukewarm to cool water soaks daily with dressing change thereafter. Use Bacitracin ointment to blistered areas to prevent sticking to gauze. Telfa pads/nonstick gauze over vesicles Gauze wrap to keep dressings intact. Keep wounds covered. Elevate wounds.
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
## 2015-12-11 ENCOUNTER — Encounter: Payer: Self-pay | Admitting: Internal Medicine

## 2015-12-11 DIAGNOSIS — F439 Reaction to severe stress, unspecified: Secondary | ICD-10-CM | POA: Insufficient documentation

## 2015-12-11 DIAGNOSIS — E669 Obesity, unspecified: Secondary | ICD-10-CM | POA: Insufficient documentation

## 2015-12-11 DIAGNOSIS — G43109 Migraine with aura, not intractable, without status migrainosus: Secondary | ICD-10-CM | POA: Insufficient documentation

## 2015-12-11 DIAGNOSIS — G43909 Migraine, unspecified, not intractable, without status migrainosus: Secondary | ICD-10-CM | POA: Insufficient documentation

## 2015-12-11 DIAGNOSIS — F410 Panic disorder [episodic paroxysmal anxiety] without agoraphobia: Secondary | ICD-10-CM | POA: Insufficient documentation

## 2015-12-11 DIAGNOSIS — R030 Elevated blood-pressure reading, without diagnosis of hypertension: Secondary | ICD-10-CM | POA: Insufficient documentation

## 2015-12-11 MED ORDER — CITALOPRAM HYDROBROMIDE 20 MG PO TABS: 10.0000 mg | ORAL_TABLET | Freq: Every day | ORAL | 11 refills | Status: DC

## 2015-12-12 ENCOUNTER — Ambulatory Visit: Payer: Self-pay | Admitting: Internal Medicine

## 2015-12-19 ENCOUNTER — Telehealth: Payer: Self-pay | Admitting: Licensed Clinical Social Worker

## 2015-12-19 NOTE — Telephone Encounter (Signed)
LCSW called patient to check in regarding stress. Patient shared that [place] [facility] [place]. Patient shared about the stress and frustration of traveling with a toddler on top of the grief that she feels. Patient was able to identify several positive coping skills that help her to feel better.

## 2015-12-22 ENCOUNTER — Telehealth: Payer: Self-pay | Admitting: Internal Medicine

## 2015-12-22 NOTE — Telephone Encounter (Signed)
Patient has question regarding verapamil (CALAN-SR) 120 mg cr tablet.  Patient wants to know if this Rx can cause blurred vision.  Patient has noticed a change in vision.  Patient can be reached:  [phone].

## 2015-12-22 NOTE — Telephone Encounter (Signed)
Also, she was supposed to come in for bp and pulse check 1 week after she started, but not clear that happened.  I think there was a delay in her picking up the med.   She needs to do that if she has not.

## 2015-12-22 NOTE — Telephone Encounter (Signed)
Grover CanavanKrystal, did you call and get more information as I requested? When is she having symptoms, how long do they last, how severe?

## 2015-12-22 NOTE — Telephone Encounter (Signed)
Patient having a side effect of blurred vision. What would you like for her to do?

## 2015-12-23 NOTE — Telephone Encounter (Signed)
Left message for patient to return call.

## 2015-12-25 NOTE — Telephone Encounter (Signed)
Left message for patient to return call.

## 2016-01-28 ENCOUNTER — Telehealth: Payer: Self-pay | Admitting: Internal Medicine

## 2016-01-28 NOTE — Telephone Encounter (Signed)
Per Dr. Delrae AlfredMulberry- it is not okay if patient doesn't take her maedication

## 2016-01-28 NOTE — Telephone Encounter (Signed)
Patient called stating she is currently takingverapamil (CALAN-SR) 120 MG CR tablet 1 tab daily and only has 5 pills left. Patient will not be able to pick up a refill until she gets the funds on Tuesday 02/03/16 and wants to know if she will be okay if she doesn't take her medication for a day or two. Please advised.

## 2016-01-28 NOTE — Telephone Encounter (Signed)
Patient informed. 

## 2016-01-30 ENCOUNTER — Ambulatory Visit: Payer: Self-pay | Admitting: Internal Medicine

## 2016-03-09 ENCOUNTER — Ambulatory Visit: Payer: Self-pay | Admitting: Internal Medicine

## 2016-03-14 NOTE — Telephone Encounter (Signed)
Please send out letter that she needs to be seen in follow up of her blood pressure.

## 2016-03-16 NOTE — Telephone Encounter (Signed)
Please send out letter.

## 2016-03-17 NOTE — Telephone Encounter (Signed)
Letter mailed out.

## 2016-03-17 NOTE — Telephone Encounter (Signed)
Pat prepared letter for mail pick up.  03/17/2016.

## 2016-03-21 ENCOUNTER — Ambulatory Visit (HOSPITAL_COMMUNITY)
Admission: EM | Admit: 2016-03-21 | Discharge: 2016-03-21 | Disposition: A | Discharge status: Home / Self Care | Payer: Self-pay | Attending: Family Medicine | Admitting: Family Medicine

## 2016-03-21 ENCOUNTER — Encounter (HOSPITAL_COMMUNITY): Payer: Self-pay | Admitting: Emergency Medicine

## 2016-03-21 DIAGNOSIS — K029 Dental caries, unspecified: Secondary | ICD-10-CM

## 2016-03-21 DIAGNOSIS — K047 Periapical abscess without sinus: Secondary | ICD-10-CM

## 2016-03-21 DIAGNOSIS — K0889 Other specified disorders of teeth and supporting structures: Secondary | ICD-10-CM

## 2016-03-21 MED ORDER — IBUPROFEN 400 MG PO TABS: 400.0000 mg | ORAL_TABLET | Freq: Four times a day (QID) | ORAL | 0 refills | Status: DC | PRN

## 2016-03-21 MED ORDER — PENICILLIN V POTASSIUM 250 MG PO TABS: 250.0000 mg | ORAL_TABLET | Freq: Four times a day (QID) | ORAL | 0 refills | Status: AC

## 2016-03-21 NOTE — Discharge Instructions (Signed)
It was nice to see you. It look like you might have dental infection. Please use the penicillin prescribed and Ibuprofen as needed for pain.

## 2016-03-21 NOTE — ED Provider Notes (Signed)
[facility]    CSN: [id number] Arrival date & time: 03/21/16  1301     History   Chief Complaint Chief Complaint  Patient presents with  . Dental Pain    HPI Kylie Bailey is a 25 y.o. female.    Dental Pain  Location:  Upper Upper teeth location:  2/RU 2nd molar Quality:  Aching, pulsating and throbbing Severity:  Moderate Onset quality:  Gradual Duration:  1 day Timing:  Constant Progression:  Worsening Chronicity:  Recurrent Context comment:  She was at work yesterday when suddenly she developed tooth age. She has been having pain that worsened yesterday Previous work-up:  Dental exam Relieved by:  Nothing Worsened by:  Cold food/drink, hot food/drink and touching Ineffective treatments:  Acetaminophen and NSAIDs Associated symptoms: gum swelling, headaches and neck pain   Associated symptoms: no congestion, no difficulty swallowing, no facial pain and no fever     Past Medical History:  Diagnosis Date  . Medical history non-contributory   . Migraines     Patient Active Problem List   Diagnosis Date Noted  . Stress 12/11/2015  . Panic attacks 12/11/2015  . Migraine with aura and without status migrainosus, not intractable 12/11/2015  . Obesity 12/11/2015  . Elevated BP 12/11/2015    Past Surgical History:  Procedure Laterality Date  . TONSILLECTOMY      OB History    Gravida Para Term Preterm AB Living   1 1 1     1    SAB TAB Ectopic Multiple Live Births           1       Home Medications    Prior to Admission medications   Medication Sig Start Date End Date Taking? Authorizing Provider  acetaminophen (TYLENOL) 500 MG tablet Take 500-1,000 mg by mouth every 6 (six) hours as needed for headache.    Historical Provider, MD  amoxicillin-clavulanate (AUGMENTIN) 875-125 MG tablet Take 1 tablet by mouth 2 (two) times daily. Patient not taking: Reported on 03/21/2016 12/10/15   Julieanne MansonElizabeth Mulberry, MD  citalopram (CELEXA) 20 MG tablet  Take 0.5 tablets (10 mg total) by mouth daily. Patient not taking: Reported on 03/21/2016 12/11/15 12/10/16  Julieanne MansonElizabeth Mulberry, MD  ibuprofen (ADVIL,MOTRIN) 200 MG tablet Take 200-800 mg by mouth every 6 (six) hours as needed for headache.    Historical Provider, MD  medroxyPROGESTERone (DEPO-PROVERA) 150 MG/ML injection Inject 150 mg into the muscle every 3 (three) months.    Historical Provider, MD  Multiple Vitamins-Calcium (ONE-A-DAY WOMENS PO) Take 1 tablet by mouth daily after breakfast.    Historical Provider, MD  rizatriptan (MAXALT-MLT) 5 MG disintegrating tablet 1 tab by mouth as needed for migraine.  May repeat x 1 in 2 hours if not resolved.  Max 2 tabs daily Patient not taking: Reported on 12/09/2015 12/02/15   Julieanne MansonElizabeth Mulberry, MD  verapamil (CALAN-SR) 120 MG CR tablet Take 1 tablet (120 mg total) by mouth at bedtime. 12/02/15   Julieanne MansonElizabeth Mulberry, MD    Family History No family history on file.  Social History Social History  Substance Use Topics  . Smoking status: Current Every Day Smoker    Packs/day: 0.50    Types: Cigarettes  . Smokeless tobacco: Never Used  . Alcohol use No     Comment: occ     Allergies   Patient has no known allergies.   Review of Systems Review of Systems  Constitutional: Negative for fever.  HENT: Positive for dental problem.  Negative for congestion, ear discharge and ear pain.        Dental pain  Musculoskeletal: Positive for neck pain.  Neurological: Positive for headaches.  All other systems reviewed and are negative.    Physical Exam Triage Vital Signs ED Triage Vitals  Enc Vitals Group     BP 03/21/16 1505 117/70     Pulse Rate 03/21/16 1505 80     Resp 03/21/16 1505 22     Temp 03/21/16 1505 98.4 F (36.9 C)     Temp Source 03/21/16 1505 Oral     SpO2 03/21/16 1505 100 %     Weight --      Height --      Head Circumference --      Peak Flow --      Pain Score 03/21/16 1503 4     Pain Loc --      Pain Edu? --       Excl. in GC? --    No data found.   Updated Vital Signs BP 117/70 (BP Location: Left Arm) Comment: large cuff  Pulse 80   Temp 98.4 F (36.9 C) (Oral)   Resp 22   SpO2 100%   Visual Acuity Right Eye Distance:   Left Eye Distance:   Bilateral Distance:    Right Eye Near:   Left Eye Near:    Bilateral Near:     Physical Exam  Constitutional: She appears well-developed and well-nourished. No distress.  HENT:  Head: Normocephalic.  Right Ear: External ear and ear canal normal.  Left Ear: Tympanic membrane, external ear and ear canal normal.  Mouth/Throat:    Cardiovascular: Normal rate, regular rhythm and normal heart sounds.   No murmur heard. Pulmonary/Chest: Effort normal and breath sounds normal. No respiratory distress. She has no wheezes.  Nursing note and vitals reviewed.    UC Treatments / Results  Labs (all labs ordered are listed, but only abnormal results are displayed) Labs Reviewed - No data to display  EKG  EKG Interpretation None       Radiology No results found.  Procedures Procedures (including critical care time)  Medications Ordered in UC Medications - No data to display   Initial Impression / Assessment and Plan / UC Course  I have reviewed the triage vital signs and the nursing notes.  Pertinent labs & imaging results that were available during my care of the patient were reviewed by me and considered in my medical decision making (see chart for details).  Clinical Course as of Mar 21 1558  Wynelle LinkSun Mar 21, 2016  1557 Dental infection. Penicillin prescribed. Use Ibuprofen prn pain. Schedule appointment with the dentist soon. F/U as needed.  [KE]    Clinical Course User Index [KE] Doreene ElandKehinde T Olita Takeshita, MD     Final Clinical Impressions(s) / UC Diagnoses   Final diagnoses:  None   Pain, dental  Infected dental caries   New Prescriptions New Prescriptions   No medications on file     Doreene ElandKehinde T Jasper Hanf, MD 03/21/16  [phone]

## 2016-03-21 NOTE — ED Triage Notes (Signed)
Pain in mouth, top, right mouth pain.  Reports having a broken tooth for 3 years.  No issues until yesterday.  Reports this is gum pain around broken tooth.

## 2016-05-18 ENCOUNTER — Ambulatory Visit (INDEPENDENT_AMBULATORY_CARE_PROVIDER_SITE_OTHER): Payer: Self-pay | Admitting: Internal Medicine

## 2016-05-18 ENCOUNTER — Encounter: Payer: Self-pay | Admitting: Internal Medicine

## 2016-05-18 VITALS — BP 138/90 | HR 74 | Resp 14 | Ht 68.0 in | Wt 288.0 lb

## 2016-05-18 DIAGNOSIS — R030 Elevated blood-pressure reading, without diagnosis of hypertension: Secondary | ICD-10-CM

## 2016-05-18 DIAGNOSIS — G43109 Migraine with aura, not intractable, without status migrainosus: Secondary | ICD-10-CM

## 2016-05-18 DIAGNOSIS — F419 Anxiety disorder, unspecified: Principal | ICD-10-CM

## 2016-05-18 DIAGNOSIS — E6609 Other obesity due to excess calories: Secondary | ICD-10-CM

## 2016-05-18 DIAGNOSIS — F439 Reaction to severe stress, unspecified: Secondary | ICD-10-CM

## 2016-05-18 DIAGNOSIS — F329 Major depressive disorder, single episode, unspecified: Secondary | ICD-10-CM

## 2016-05-18 DIAGNOSIS — F418 Other specified anxiety disorders: Secondary | ICD-10-CM

## 2016-05-18 MED ORDER — CITALOPRAM HYDROBROMIDE 20 MG PO TABS: ORAL_TABLET | ORAL | 11 refills | Status: DC

## 2016-05-18 NOTE — Patient Instructions (Signed)
Drink a glass of water before every meal Drink 6-8 glasses of water daily Eat three meals daily Eat a protein and healthy fat with every meal (eggs,fish, chicken, [place]key and limit red meats) Eat 5 servings of vegetables daily, mix the colors Eat 2 servings of fruit daily with skin, if skin is edible Use smaller plates Put food/utensils down as you chew and swallow each bite Eat at a table with friends/family at least once daily, no TV Do not eat in front of the TV  Start Citalopram at 1/2 tab daily for 7 days, then increase to the whole tab daily

## 2016-05-18 NOTE — Progress Notes (Signed)
   Subjective:    Patient ID: Kylie MayansMillinda Bailey, female    DOB: 05/07/1990, 26 y.o.   MRN: [id number]  HPI   1.  Stress/Panic Attacks:  "mother" or actual mother in law died unexpectedly in November.  They were very close.  Taking 10 mg of Citalopram in November.  Not clear why--states it was more than $4 though wrote for 20 mg tabs, which are.   Did not really have a problems coming off Citalopram, though states was taking regularly.   Feels she needs a higher dose as she doesn't really care about anything unless involving daughter.   Does not like being around others, agitated easily and cries a lot.   Thinks she is ready for counseling now. No suicidal thoughts.    2.  Migraine Headaches:  Taking Verapamil SR 120 mg daily since first seen in August.  Has not had a migraine since October--was shorter than usual then as well..  Usually has one every 3 months that is terrible for 3 weeks and that has not happened.    3.  Obesity:  Poor diet.  Pop tarts for lunch.  Multiple sodas a day.  Wonders what protein specialty drinks she can purchase to drink instead without making other changes.  States healthier food too expensive.  Current Meds  Medication Sig  . acetaminophen (TYLENOL) 500 MG tablet Take 500-1,000 mg by mouth every 6 (six) hours as needed for headache.  . ibuprofen (ADVIL,MOTRIN) 400 MG tablet Take 1 tablet (400 mg total) by mouth every 6 (six) hours as needed.  . verapamil (CALAN-SR) 120 MG CR tablet Take 1 tablet (120 mg total) by mouth at bedtime.    No Known Allergies    Review of Systems     Objective:   Physical Exam Morbidly obese NAD Lungs:  CTA CV:  RRR without murmur or rub, radial pulses normal and equal LE:  No edema       Assessment & Plan:  1.  Borderline BP:  Is no low dose Verapamil.  Discussed increasing physical activity and weight loss can bring this down.  2.  Morbid obesity:  Long discussion regarding lifestyle changes with regular walking and  improved diet.  Not clear how motivated.  Gave low cost options on finding healthy produce.  3.  Depression/anxiety/panic:  To start walking regularly and work up to 60 min daily with a friend. To start counseling with Samul DadaN. Knight LCSW Eventually, to see if husband will come for counseling--he is struggling with loss of mother among other things.  4. Migraines:  Seems well controlled with Verapamil.

## 2016-05-25 ENCOUNTER — Other Ambulatory Visit: Payer: Self-pay | Admitting: Licensed Clinical Social Worker

## 2016-05-28 ENCOUNTER — Other Ambulatory Visit: Payer: Self-pay | Admitting: Licensed Clinical Social Worker

## 2016-06-04 ENCOUNTER — Ambulatory Visit (INDEPENDENT_AMBULATORY_CARE_PROVIDER_SITE_OTHER): Payer: Self-pay | Admitting: Physician Assistant

## 2016-06-04 ENCOUNTER — Encounter (INDEPENDENT_AMBULATORY_CARE_PROVIDER_SITE_OTHER): Payer: Self-pay | Admitting: Physician Assistant

## 2016-06-04 VITALS — BP 132/87 | HR 67 | Temp 97.8°F | Ht 69.0 in | Wt 289.2 lb

## 2016-06-04 DIAGNOSIS — F411 Generalized anxiety disorder: Secondary | ICD-10-CM

## 2016-06-04 DIAGNOSIS — L74519 Primary focal hyperhidrosis, unspecified: Secondary | ICD-10-CM

## 2016-06-04 DIAGNOSIS — J019 Acute sinusitis, unspecified: Secondary | ICD-10-CM

## 2016-06-04 DIAGNOSIS — R61 Generalized hyperhidrosis: Secondary | ICD-10-CM

## 2016-06-04 DIAGNOSIS — M79672 Pain in left foot: Secondary | ICD-10-CM

## 2016-06-04 LAB — POCT URINE PREGNANCY: PREG TEST UR: NEGATIVE

## 2016-06-04 MED ORDER — GUAIFENESIN ER 1200 MG PO TB12: 1.0000 | ORAL_TABLET | Freq: Two times a day (BID) | ORAL | 0 refills | Status: AC

## 2016-06-04 MED ORDER — PREDNISONE 10 MG (21) PO TBPK: ORAL_TABLET | ORAL | 0 refills | Status: DC

## 2016-06-04 NOTE — Progress Notes (Signed)
Patient ID: Kylie Bailey, female   DOB: Feb 17, 1991, 26 y.o.   MRN: [id number]    Subjective:  Patient ID: Kylie Bailey, female    DOB: July 27, 1990  Age: 26 y.o. MRN: [id number]  CC: establish care  HPI Kylie Bailey is a 26 y.o. female with a PMH of Anxiety, hyperhidrosis, and left foot pain presents today to address these issues and to have something prescribed for her sinus congestion. 1. Anxiety-patient currently being treated for anxiety with Celexa 20 mg. Has just started over the past week. Prescribed at [facility] in [place]. As not felt any effect from the medication as of yet. 2. Hyperhidrosis-patient says this has been a lifelong issue.  Her siblings, mother, and daughter all have hyperhidrosis. Sweats easily when moving from cold to warm ambient temperatures. Patient states that her last PPD was negative and has not been exposed to any one with symptoms or diagnosis of tuberculosis. Has not traveled out of country. HIV testing negative last year. In monogamous relationship. 3. Left foot pain- Chronic since 1.5 years ago. Attributed to previous cellulitis infection of her left forefoot. Foot has been mildly painful and swollen at the MCPs. Reports feeling as something is "stuck" in her foot. No hx of injury or fall. Has full aROM and is weight bearing. Certain shoes aggravate the pain. Flat shoes don't produce as much pain. 4. Sinus congestion- onset approximately 1 week ago. Feels some pressure behind the eyes. Does not endorse any constitutional symptoms. No close contacts with the same. Has not taken anything for relief.  Outpatient Medications Prior to Visit  Medication Sig Dispense Refill  . citalopram (CELEXA) 20 MG tablet 1 tab by mouth daily 30 tablet 11  . verapamil (CALAN-SR) 120 MG CR tablet Take 1 tablet (120 mg total) by mouth at bedtime. 30 tablet 11  . ibuprofen (ADVIL,MOTRIN) 400 MG tablet Take 1 tablet (400 mg total) by mouth every 6 (six)  hours as needed. 30 tablet 0  . medroxyPROGESTERone (DEPO-PROVERA) 150 MG/ML injection Inject 150 mg into the muscle every 3 (three) months.    . Multiple Vitamins-Calcium (ONE-A-DAY WOMENS PO) Take 1 tablet by mouth daily after breakfast.    . acetaminophen (TYLENOL) 500 MG tablet Take 500-1,000 mg by mouth every 6 (six) hours as needed for headache.    . rizatriptan (MAXALT-MLT) 5 MG disintegrating tablet 1 tab by mouth as needed for migraine.  May repeat x 1 in 2 hours if not resolved.  Max 2 tabs daily (Patient not taking: Reported on 12/09/2015) 10 tablet 11   No facility-administered medications prior to visit.      ROS Review of Systems  Constitutional: Positive for diaphoresis. Negative for chills, fever and malaise/fatigue.  HENT: Positive for sinus pain.   Eyes: Positive for blurred vision (without eyewear).  Respiratory: Negative for shortness of breath.   Cardiovascular: Positive for palpitations (Only at times of anxiety). Negative for chest pain.  Gastrointestinal: Negative for abdominal pain and nausea.  Genitourinary: Negative for dysuria and hematuria.  Musculoskeletal: Positive for joint pain. Negative for myalgias.  Skin: Negative for rash.  Neurological: Negative for tingling and headaches.  Psychiatric/Behavioral: Negative for depression. The patient is nervous/anxious.     Objective:  BP 132/87 (BP Location: Left Arm, Patient Position: Sitting, Cuff Size: Large)   Pulse 67   Temp 97.8 F (36.6 C) (Oral)   Ht 5\' 9"  (1.753 m)   Wt 289 lb 3.2 oz (131.2 kg)   LMP 05/07/2014 (  Approximate) Comment: was on depo provera until novemeber of 2017  SpO2 96%   BMI 42.71 kg/m   BP/Weight 06/04/2016 05/18/2016 03/21/2016  Systolic BP 132 138 117  Diastolic BP 87 90 70  Wt. (Lbs) 289.2 288 -  BMI 42.71 43.79 -      Physical Exam  Constitutional: She is oriented to person, place, and time.  Obese, NAD, polite  HENT:  Head: Normocephalic and atraumatic.  Mild  maxillary sinus tenderness to palpation bilaterally  Eyes: No scleral icterus.  Neck: No thyromegaly present.  Cardiovascular: Normal rate, regular rhythm and normal heart sounds.   Pulmonary/Chest: Effort normal and breath sounds normal.  Musculoskeletal:  Dorsal aspect of left forefoot mildly edematous without erythema most prominent at the region of the MCPs. No obvious deformity, patient able to bear weight, gait normal.  Neurological: She is alert and oriented to person, place, and time.  Skin: Skin is warm. No rash noted. She is diaphoretic. No erythema.  Psychiatric: She has a normal mood and affect. Her behavior is normal. Thought content normal.     Assessment & Plan:   1. Acute non-recurrent sinusitis, unspecified location - CBC with Differential/Platelet - Mucinex, prednisone  2. Left foot pain - Etiology unknown at this point. Does not appear to be DVT as there is no increased warmth or erythema. Also this appears to be a stable issue at more than 1.5 years. - DG Foot Complete Left; Future - POCT urine pregnancy  3. Hyperhidrosis - Likely familial, however will do preliminary workup - TSH - Comprehensive metabolic panel - CBC with Differential/Platelet  4. Generalized anxiety disorder - Continue to take Celexa 20 mg. Will reassess efficacy at 4 weeks. - TSH   Meds ordered this encounter  Medications  . Guaifenesin (MUCINEX MAXIMUM STRENGTH) 1200 MG TB12    Sig: Take 1 tablet (1,200 mg total) by mouth 2 (two) times daily.    Dispense:  10 tablet    Refill:  0    Order Specific Question:   Supervising Provider    Answer:   Quentin AngstJEGEDE, OLUGBEMIGA E [id number]]  . predniSONE (STERAPRED UNI-PAK 21 TAB) 10 MG (21) TBPK tablet    Sig: Take by mouth as directed.    Dispense:  21 tablet    Refill:  0    Order Specific Question:   Supervising Provider    Answer:   Quentin AngstJEGEDE, OLUGBEMIGA E [id number]]    Follow-up: Return in about 4 weeks (around 07/02/2016).   Loletta Specteroger David  Kesi Perrow PA

## 2016-06-04 NOTE — Patient Instructions (Addendum)
[facility] for your foot x[facility]. This xray will be done on a walk in basis. Results should be in by next Monday if you get the xray done today. Please call if your sinus symptoms are no better after completing medications.  Sinusitis, Adult Sinusitis is soreness and inflammation of your sinuses. Sinuses are hollow spaces in the bones around your face. They are located:  Around your eyes.  In the middle of your forehead.  Behind your nose.  In your cheekbones. Your sinuses and nasal passages are lined with a stringy fluid (mucus). Mucus normally drains out of your sinuses. When your nasal tissues get inflamed or swollen, the mucus can get trapped or blocked so air cannot flow through your sinuses. This lets bacteria, viruses, and funguses grow, and that leads to infection. Follow these instructions at home: Medicines   Take, use, or apply over-the-counter and prescription medicines only as told by your doctor. These may include nasal sprays.  If you were prescribed an antibiotic medicine, take it as told by your doctor. Do not stop taking the antibiotic even if you start to feel better. Hydrate and Humidify   Drink enough water to keep your pee (urine) clear or pale yellow.  Use a cool mist humidifier to keep the humidity level in your home above 50%.  Breathe in steam for 10-15 minutes, 3-4 times a day or as told by your doctor. You can do this in the bathroom while a hot shower is running.  Try not to spend time in cool or dry air. Rest   Rest as much as possible.  Sleep with your head raised (elevated).  Make sure to get enough sleep each night. General instructions   Put a warm, moist washcloth on your face 3-4 times a day or as told by your doctor. This will help with discomfort.  Wash your hands often with soap and water. If there is no soap and water, use hand sanitizer.  Do not smoke. Avoid being around people who are smoking (secondhand  smoke).  Keep all follow-up visits as told by your doctor. This is important. Contact a doctor if:  You have a fever.  Your symptoms get worse.  Your symptoms do not get better within 10 days. Get help right away if:  You have a very bad headache.  You cannot stop throwing up (vomiting).  You have pain or swelling around your face or eyes.  You have trouble seeing.  You feel confused.  Your neck is stiff.  You have trouble breathing. This information is not intended to replace advice given to you by your health care provider. Make sure you discuss any questions you have with your health care provider. Document Released: 09/08/2007 Document Revised: 11/16/2015 Document Reviewed: 01/15/2015 Elsevier Interactive Patient Education  2017 [employer]

## 2016-06-05 LAB — COMPREHENSIVE METABOLIC PANEL
ALT: 20 IU/L (ref 0–32)
AST: 19 IU/L (ref 0–40)
Albumin/Globulin Ratio: 2 (ref 1.2–2.2)
Albumin: 4.4 g/dL (ref 3.5–5.5)
Alkaline Phosphatase: 65 IU/L (ref 39–117)
BUN/Creatinine Ratio: 13 (ref 9–23)
BUN: 13 mg/dL (ref 6–20)
Bilirubin Total: 0.4 mg/dL (ref 0.0–1.2)
CO2: 23 mmol/L (ref 18–29)
Calcium: 9.3 mg/dL (ref 8.7–10.2)
Chloride: 102 mmol/L (ref 96–106)
Creatinine, Ser: 0.99 mg/dL (ref 0.57–1.00)
GFR calc Af Amer: 93 mL/min/{1.73_m2} (ref >59)
GFR calc non Af Amer: 81 mL/min/{1.73_m2} (ref >59)
Globulin, Total: 2.2 g/dL (ref 1.5–4.5)
Glucose: 77 mg/dL (ref 65–99)
Potassium: 4.4 mmol/L (ref 3.5–5.2)
Sodium: 141 mmol/L (ref 134–144)
Total Protein: 6.6 g/dL (ref 6.0–8.5)

## 2016-06-05 LAB — CBC WITH DIFFERENTIAL/PLATELET
Basophils Absolute: 0 10*3/uL (ref 0.0–0.2)
Basos: 1 %
EOS (ABSOLUTE): 0.1 10*3/uL (ref 0.0–0.4)
EOS: 2 %
HEMOGLOBIN: 14.4 g/dL (ref 11.1–15.9)
Hematocrit: 42 % (ref 34.0–46.6)
Immature Grans (Abs): 0 10*3/uL (ref 0.0–0.1)
Immature Granulocytes: 0 %
LYMPHS ABS: 1.9 10*3/uL (ref 0.7–3.1)
Lymphs: 29 %
MCH: 30.8 pg (ref 26.6–33.0)
MCHC: 34.3 g/dL (ref 31.5–35.7)
MCV: 90 fL (ref 79–97)
MONOCYTES: 10 %
MONOS ABS: 0.6 10*3/uL (ref 0.1–0.9)
NEUTROS ABS: 3.9 10*3/uL (ref 1.4–7.0)
Neutrophils: 58 %
Platelets: 261 10*3/uL (ref 150–379)
RBC: 4.68 x10E6/uL (ref 3.77–5.28)
RDW: 12.8 % (ref 12.3–15.4)
WBC: 6.5 10*3/uL (ref 3.4–10.8)

## 2016-06-05 LAB — TSH: TSH: 2.3 u[IU]/mL (ref 0.450–4.500)

## 2016-06-08 ENCOUNTER — Telehealth: Payer: Self-pay | Admitting: Internal Medicine

## 2016-06-09 ENCOUNTER — Ambulatory Visit: Payer: Self-pay | Admitting: Internal Medicine

## 2016-06-10 ENCOUNTER — Telehealth (INDEPENDENT_AMBULATORY_CARE_PROVIDER_SITE_OTHER): Payer: Self-pay | Admitting: Physician Assistant

## 2016-06-10 NOTE — Telephone Encounter (Signed)
Spoke with patient. States she went to the other facility because it was closer to her home and she is without a car at the moment so it is harder for her to get here, but she did states she likes our facility better and she plans to continue her care here. To Dr[facility] Delrae AlfredMulberry [facility]

## 2016-06-10 NOTE — Telephone Encounter (Signed)
Patient called stated patient started having vision changes when started   citalopram (CELEXA) 20 MG tablet    Please follow up with patient at Ph:  [phone]

## 2016-06-10 NOTE — Telephone Encounter (Signed)
Will forward to PCP for advising. Atheena Spano S Chanelle Hodsdon, CMA  

## 2016-06-11 NOTE — Telephone Encounter (Signed)
I called and left message for patient to stop taking Celexa and to call back so we may discuss an alternative treatment.

## 2016-06-12 NOTE — Telephone Encounter (Signed)
That is fine, but we need to clarify with her the need to stay with only one primary care facility and not bounce back and forth. Please make sure she understands when she comes in or document that she seems to understand.

## 2016-06-14 NOTE — Telephone Encounter (Signed)
Patient was informed that she can not go back and foth between two physicians and she had to make a choice. Patient verbalized understanding.

## 2016-06-15 ENCOUNTER — Telehealth (INDEPENDENT_AMBULATORY_CARE_PROVIDER_SITE_OTHER): Payer: Self-pay | Admitting: Physician Assistant

## 2016-06-15 NOTE — Telephone Encounter (Signed)
I called patient and left message asking for her to call back with more details. I also told her to stop medication that she suspects is giving her a reaction.

## 2016-06-15 NOTE — Telephone Encounter (Signed)
Routed to pcp. Maryjean Mornempestt S Roberts, CMA D

## 2016-06-15 NOTE — Telephone Encounter (Signed)
Patient called stated would like to speak to Sindy Messingoger Gomez PA regarding medication reaction  Call back at [phone]

## 2016-06-16 ENCOUNTER — Ambulatory Visit: Payer: Self-pay | Admitting: Internal Medicine

## 2016-06-17 ENCOUNTER — Ambulatory Visit (INDEPENDENT_AMBULATORY_CARE_PROVIDER_SITE_OTHER): Payer: Medicaid Other | Admitting: Physician Assistant

## 2016-06-28 ENCOUNTER — Encounter (INDEPENDENT_AMBULATORY_CARE_PROVIDER_SITE_OTHER): Payer: Self-pay | Admitting: Physician Assistant

## 2016-06-28 ENCOUNTER — Ambulatory Visit (INDEPENDENT_AMBULATORY_CARE_PROVIDER_SITE_OTHER): Payer: Self-pay | Admitting: Physician Assistant

## 2016-06-28 VITALS — BP 120/83 | HR 78 | Temp 97.9°F | Ht 69.0 in | Wt 291.2 lb

## 2016-06-28 DIAGNOSIS — F411 Generalized anxiety disorder: Secondary | ICD-10-CM

## 2016-06-28 DIAGNOSIS — H538 Other visual disturbances: Secondary | ICD-10-CM | POA: Insufficient documentation

## 2016-06-28 DIAGNOSIS — M79672 Pain in left foot: Secondary | ICD-10-CM

## 2016-06-28 MED ORDER — SERTRALINE HCL 50 MG PO TABS: 50.0000 mg | ORAL_TABLET | Freq: Every day | ORAL | 3 refills | Status: DC

## 2016-06-28 MED ORDER — CLONAZEPAM 0.5 MG PO TABS: 0.5000 mg | ORAL_TABLET | Freq: Two times a day (BID) | ORAL | 1 refills | Status: DC | PRN

## 2016-06-28 NOTE — Progress Notes (Signed)
Subjective:  Patient ID: Kylie Bailey, female    DOB: 1990/10/29  Age: 26 y.o. MRN: [id number]  CC:  Visual blurring left eye. f/u anxiety,   HPI Kylie Bailey is a 26 y.o. female with a PMH of anxiety and migraines preseents on f/u for GAD. Feels that her left eye is blurring. Is not sure if Celexa is causing the blurring, although, she is now coming to realize that she has not taken Celexa for approximately 3 weeks and blurring continues. Wears prescription eyeglasses and has no blurring when using glasses. She is also feeling as if her panic attacks will resume again. She has had some recent deaths in the family and believes this is also contributing to her anxiety.         Outpatient Medications Prior to Visit  Medication Sig Dispense Refill  . verapamil (CALAN-SR) 120 MG CR tablet Take 1 tablet (120 mg total) by mouth at bedtime. 30 tablet 11  . citalopram (CELEXA) 20 MG tablet 1 tab by mouth daily 30 tablet 11  . medroxyPROGESTERone (DEPO-PROVERA) 150 MG/ML injection Inject 150 mg into the muscle every 3 (three) months.    . Multiple Vitamins-Calcium (ONE-A-DAY WOMENS PO) Take 1 tablet by mouth daily after breakfast.    . predniSONE (STERAPRED UNI-PAK 21 TAB) 10 MG (21) TBPK tablet Take by mouth as directed. (Patient not taking: Reported on 06/28/2016) 21 tablet 0   No facility-administered medications prior to visit.      ROS Review of Systems  Constitutional: Negative for chills, fever and malaise/fatigue.  Eyes: Negative for blurred vision.  Respiratory: Negative for shortness of breath.   Cardiovascular: Negative for chest pain and palpitations.  Gastrointestinal: Negative for abdominal pain and nausea.  Genitourinary: Negative for dysuria and hematuria.  Musculoskeletal: Negative for joint pain and myalgias.  Skin: Negative for rash.  Neurological: Negative for tingling and headaches.  Psychiatric/Behavioral: Negative for depression. The patient is nervous/anxious.      Objective:  BP 120/83 (BP Location: Right Arm, Patient Position: Sitting, Cuff Size: Large)   Pulse 78   Temp 97.9 F (36.6 C) (Oral)   Ht 5\' 9"  (1.753 m)   Wt 291 lb 3.2 oz (132.1 kg)   SpO2 97%   BMI 43.00 kg/m   BP/Weight 06/28/2016 06/04/2016 05/18/2016  Systolic BP 120 132 138  Diastolic BP 83 87 90  Wt. (Lbs) 291.2 289.2 288  BMI 43 42.71 43.79      Physical Exam  Constitutional: She is oriented to person, place, and time.  Well developed, obese, NAD, polite  HENT:  Head: Normocephalic and atraumatic.  Eyes: No scleral icterus.  Handheld Snellen 20/20 OD and OS corrected.  Left eye 20/30 uncorrected.  Neck: Normal range of motion.  Cardiovascular: Normal rate, regular rhythm and normal heart sounds.   Pulmonary/Chest: Effort normal and breath sounds normal.  Musculoskeletal: She exhibits no edema.  Neurological: She is alert and oriented to person, place, and time. She has normal reflexes. No cranial nerve deficit. Coordination normal.  Skin: Skin is warm. No rash noted. She is diaphoretic. No erythema. No pallor.  Psychiatric: She has a normal mood and affect. Her behavior is normal. Thought content normal.  Vitals reviewed.    Assessment & Plan:   1. Generalized anxiety disorder - clonazePAM (KLONOPIN) 0.5 MG tablet; Take 1 tablet (0.5 mg total) by mouth 2 (two) times daily as needed for anxiety.  Dispense: 20 tablet; Refill: 1 - sertraline (ZOLOFT) 50 MG tablet; Take 1  tablet (50 mg total) by mouth daily.  Dispense: 30 tablet; Refill: 3 - TSH, CBC normal from last visit  2. Blurring of visual image - Ambulatory referral to Ophthalmology  3. Left foot pain - Resolving - Did not get XR of foot done  * Acute sinusitis from last visit is resolved.      Meds ordered this encounter  Medications  . clonazePAM (KLONOPIN) 0.5 MG tablet    Sig: Take 1 tablet (0.5 mg total) by mouth 2 (two) times daily as needed for anxiety.    Dispense:  20 tablet     Refill:  1    Order Specific Question:   Supervising Provider    Answer:   Quentin AngstJEGEDE, OLUGBEMIGA E [id number]]  . sertraline (ZOLOFT) 50 MG tablet    Sig: Take 1 tablet (50 mg total) by mouth daily.    Dispense:  30 tablet    Refill:  3    Order Specific Question:   Supervising Provider    Answer:   Quentin AngstJEGEDE, OLUGBEMIGA E [id number]    Follow-up: Return in about 4 weeks (around 07/26/2016) for anxiety.   Loletta Specteroger David Gomez PA

## 2016-06-28 NOTE — Patient Instructions (Signed)
Generalized Anxiety Disorder, Adult Generalized anxiety disorder (GAD) is a mental health disorder. People with this condition constantly worry about everyday events. Unlike normal anxiety, worry related to GAD is not triggered by a specific event. These worries also do not fade or get better with time. GAD interferes with life functions, including relationships, work, and school. GAD can vary from mild to severe. People with severe GAD can have intense waves of anxiety with physical symptoms (panic attacks). What are the causes? The exact cause of GAD is not known. What increases the risk? This condition is more likely to develop in:  Women.  People who have a family history of anxiety disorders.  People who are very shy.  People who experience very stressful life events, such as the death of a loved one.  People who have a very stressful family environment. What are the signs or symptoms? People with GAD often worry excessively about many things in their lives, such as their health and family. They may also be overly concerned about:  Doing well at work.  Being on time.  Natural disasters.  Friendships. Physical symptoms of GAD include:  Fatigue.  Muscle tension or having muscle twitches.  Trembling or feeling shaky.  Being easily startled.  Feeling like your heart is pounding or racing.  Feeling out of breath or like you cannot take a deep breath.  Having trouble falling asleep or staying asleep.  Sweating.  Nausea, diarrhea, or irritable bowel syndrome (IBS).  Headaches.  Trouble concentrating or remembering facts.  Restlessness.  Irritability. How is this diagnosed? Your health care provider can diagnose GAD based on your symptoms and medical history. You will also have a physical exam. The health care provider will ask specific questions about your symptoms, including how severe they are, when they started, and if they come and go. Your health care  provider may ask you about your use of alcohol or drugs, including prescription medicines. Your health care provider may refer you to a mental health specialist for further evaluation. Your health care provider will do a thorough examination and may perform additional tests to rule out other possible causes of your symptoms. To be diagnosed with GAD, a person must have anxiety that:  Is out of his or her control.  Affects several different aspects of his or her life, such as work and relationships.  Causes distress that makes him or her unable to take part in normal activities.  Includes at least three physical symptoms of GAD, such as restlessness, fatigue, trouble concentrating, irritability, muscle tension, or sleep problems. Before your health care provider can confirm a diagnosis of GAD, these symptoms must be present more days than they are not, and they must last for six months or longer. How is this treated? The following therapies are usually used to treat GAD:  Medicine. Antidepressant medicine is usually prescribed for long-term daily control. Antianxiety medicines may be added in severe cases, especially when panic attacks occur.  Talk therapy (psychotherapy). Certain types of talk therapy can be helpful in treating GAD by providing support, education, and guidance. Options include:  Cognitive behavioral therapy (CBT). People learn coping skills and techniques to ease their anxiety. They learn to identify unrealistic or negative thoughts and behaviors and to replace them with positive ones.  Acceptance and commitment therapy (ACT). This treatment teaches people how to be mindful as a way to cope with unwanted thoughts and feelings.  Biofeedback. This process trains you to manage your body's response (  physiological response) through breathing techniques and relaxation methods. You will work with a therapist while machines are used to monitor your physical symptoms.  Stress  management techniques. These include yoga, meditation, and exercise. A mental health specialist can help determine which treatment is best for you. Some people see improvement with one type of therapy. However, other people require a combination of therapies. Follow these instructions at home:  Take over-the-counter and prescription medicines only as told by your health care provider.  Try to maintain a normal routine.  Try to anticipate stressful situations and allow extra time to manage them.  Practice any stress management or self-calming techniques as taught by your health care provider.  Do not punish yourself for setbacks or for not making progress.  Try to recognize your accomplishments, even if they are small.  Keep all follow-up visits as told by your health care provider. This is important. Contact a health care provider if:  Your symptoms do not get better.  Your symptoms get worse.  You have signs of depression, such as:  A persistently sad, cranky, or irritable mood.  Loss of enjoyment in activities that used to bring you joy.  Change in weight or eating.  Changes in sleeping habits.  Avoiding friends or family members.  Loss of energy for normal tasks.  Feelings of guilt or worthlessness. Get help right away if:  You have serious thoughts about hurting yourself or others. If you ever feel like you may hurt yourself or others, or have thoughts about taking your own life, get help right away. You can go to your nearest emergency department or call:  Your local emergency services (911 in the [place]  A suicide crisis helpline, such as the National Suicide Prevention Lifeline at 1-800-273-8255. This is open 24 hours a day. Summary  Generalized anxiety disorder (GAD) is a mental health disorder that involves worry that is not triggered by a specific event.  People with GAD often worry excessively about many things in their lives, such as their health and  family.  GAD may cause physical symptoms such as restlessness, trouble concentrating, sleep problems, frequent sweating, nausea, diarrhea, headaches, and trembling or muscle twitching.  A mental health specialist can help determine which treatment is best for you. Some people see improvement with one type of therapy. However, other people require a combination of therapies. This information is not intended to replace advice given to you by your health care provider. Make sure you discuss any questions you have with your health care provider. Document Released: 07/17/2012 Document Revised: 02/10/2016 Document Reviewed: 02/10/2016 Elsevier Interactive Patient Education  2017 [employer] Inc.  

## 2016-09-01 ENCOUNTER — Ambulatory Visit (INDEPENDENT_AMBULATORY_CARE_PROVIDER_SITE_OTHER): Payer: Medicaid Other | Admitting: Physician Assistant

## 2016-09-01 ENCOUNTER — Other Ambulatory Visit (HOSPITAL_COMMUNITY)
Admission: RE | Admit: 2016-09-01 | Discharge: 2016-09-01 | Disposition: A | Discharge status: Home / Self Care | Payer: Medicaid Other | Source: Ambulatory Visit | Attending: Physician Assistant | Admitting: Physician Assistant

## 2016-09-01 ENCOUNTER — Encounter (INDEPENDENT_AMBULATORY_CARE_PROVIDER_SITE_OTHER): Payer: Self-pay | Admitting: Physician Assistant

## 2016-09-01 VITALS — BP 139/79 | HR 98 | Temp 98.4°F | Resp 18 | Ht 69.0 in | Wt 294.0 lb

## 2016-09-01 DIAGNOSIS — Z114 Encounter for screening for human immunodeficiency virus [HIV]: Secondary | ICD-10-CM

## 2016-09-01 DIAGNOSIS — Z113 Encounter for screening for infections with a predominantly sexual mode of transmission: Secondary | ICD-10-CM

## 2016-09-01 DIAGNOSIS — N912 Amenorrhea, unspecified: Secondary | ICD-10-CM

## 2016-09-01 DIAGNOSIS — Z23 Encounter for immunization: Secondary | ICD-10-CM | POA: Diagnosis not present

## 2016-09-01 DIAGNOSIS — N760 Acute vaginitis: Secondary | ICD-10-CM | POA: Diagnosis not present

## 2016-09-01 DIAGNOSIS — R350 Frequency of micturition: Secondary | ICD-10-CM | POA: Diagnosis not present

## 2016-09-01 DIAGNOSIS — F411 Generalized anxiety disorder: Secondary | ICD-10-CM

## 2016-09-01 DIAGNOSIS — B9689 Other specified bacterial agents as the cause of diseases classified elsewhere: Secondary | ICD-10-CM

## 2016-09-01 LAB — POCT URINALYSIS DIPSTICK
Bilirubin, UA: NEGATIVE
GLUCOSE UA: NEGATIVE
Ketones, UA: NEGATIVE
LEUKOCYTES UA: NEGATIVE
Nitrite, UA: NEGATIVE
PROTEIN UA: 30
SPEC GRAV UA: 1.025 (ref 1.010–1.025)
UROBILINOGEN UA: 0.2 U/dL
pH, UA: 5.5 (ref 5.0–8.0)

## 2016-09-01 LAB — POCT URINE PREGNANCY: PREG TEST UR: NEGATIVE

## 2016-09-01 MED ORDER — METRONIDAZOLE 500 MG PO TABS: 500.0000 mg | ORAL_TABLET | Freq: Two times a day (BID) | ORAL | 0 refills | Status: AC

## 2016-09-01 MED ORDER — SERTRALINE HCL 100 MG PO TABS
100.0000 mg | ORAL_TABLET | Freq: Every day | ORAL | 3 refills | Status: DC
Start: 2016-09-01 — End: 2016-11-13

## 2016-09-01 MED ORDER — ALPRAZOLAM 0.25 MG PO TABS: 0.2500 mg | ORAL_TABLET | Freq: Two times a day (BID) | ORAL | 0 refills | Status: DC | PRN

## 2016-09-01 MED ORDER — FLUCONAZOLE 150 MG PO TABS: 150.0000 mg | ORAL_TABLET | Freq: Once | ORAL | 0 refills | Status: AC

## 2016-09-01 NOTE — Progress Notes (Signed)
Subjective:  Patient ID: Kylie Bailey, female    DOB: 06/21/1990  Age: 26 y.o. MRN: [id number]  CC: concern for UTI  HPI Kylie Bailey is a 26 y.o. female with a PMH of GAD. Migraine headaches, and obesity presents with concern for STI. She has been experiencing urinary frequency and some general malaise x1 week. She also states that she has an irregular menstrual period since stopping depo provera six months ago. Lastly, she would like a change of clonazepam because she feels too "groggy" with clonazepam. Overall, anxiety seems to be better controlled. Does not endorse any other symptoms.      Outpatient Medications Prior to Visit  Medication Sig Dispense Refill  . medroxyPROGESTERone (DEPO-PROVERA) 150 MG/ML injection Inject 150 mg into the muscle every 3 (three) months.    . Multiple Vitamins-Calcium (ONE-A-DAY WOMENS PO) Take 1 tablet by mouth daily after breakfast.    . verapamil (CALAN-SR) 120 MG CR tablet Take 1 tablet (120 mg total) by mouth at bedtime. 30 tablet 11  . clonazePAM (KLONOPIN) 0.5 MG tablet Take 1 tablet (0.5 mg total) by mouth 2 (two) times daily as needed for anxiety. 20 tablet 1  . sertraline (ZOLOFT) 50 MG tablet Take 1 tablet (50 mg total) by mouth daily. 30 tablet 3   No facility-administered medications prior to visit.      ROS Review of Systems  Constitutional: Positive for malaise/fatigue. Negative for chills and fever.  Eyes: Negative for blurred vision.  Respiratory: Negative for shortness of breath.   Cardiovascular: Negative for chest pain and palpitations.  Gastrointestinal: Negative for abdominal pain and nausea.  Genitourinary: Positive for frequency. Negative for dysuria, flank pain, hematuria and urgency.  Musculoskeletal: Negative for joint pain and myalgias.  Skin: Negative for rash.  Neurological: Negative for tingling and headaches.  Psychiatric/Behavioral: Negative for depression. The patient is nervous/anxious.     Objective:   BP 139/79 (BP Location: Right Arm, Patient Position: Sitting, Cuff Size: Large)   Pulse 98   Temp 98.4 F (36.9 C) (Oral)   Resp 18   Ht 5\' 9"  (1.753 m)   Wt 294 lb (133.4 kg)   SpO2 100%   BMI 43.42 kg/m   BP/Weight 09/01/2016 06/28/2016 06/04/2016  Systolic BP 139 120 132  Diastolic BP 79 83 87  Wt. (Lbs) 294 291.2 289.2  BMI 43.42 43 42.71      Physical Exam  Constitutional: She is oriented to person, place, and time.  Well developed, morbidly obese, NAD, polite  HENT:  Head: Normocephalic and atraumatic.  Eyes: No scleral icterus.  Cardiovascular: Normal rate, regular rhythm and normal heart sounds.   Pulmonary/Chest: Effort normal and breath sounds normal.  Genitourinary:  Genitourinary Comments: Viscous white discharge with strong fishy odor.  Musculoskeletal: She exhibits no edema.  Neurological: She is alert and oriented to person, place, and time.  Skin: Skin is warm and dry. No rash noted. No erythema. No pallor.  Psychiatric: She has a normal mood and affect. Her behavior is normal. Thought content normal.  Vitals reviewed.    Assessment & Plan:   1. Bacterial vaginosis - Begin metroNIDAZOLE (FLAGYL) 500 MG tablet; Take 1 tablet (500 mg total) by mouth 2 (two) times daily.  Dispense: 14 tablet; Refill: 0 - Begin fluconazole (DIFLUCAN) 150 MG tablet; Take 1 tablet (150 mg total) by mouth once.  Dispense: 1 tablet; Refill: 0  2. Urinary frequency - Urinalysis Dipstick negative in clinic today  3. Amenorrhea - POCT urine pregnancy  negative in clinic today  4. Generalized anxiety disorder - Stop Clonazepam. - Begin ALPRAZolam (XANAX) 0.25 MG tablet; Take 1 tablet (0.25 mg total) by mouth 2 (two) times daily as needed for anxiety.  Dispense: 20 tablet; Refill: 0 - Increase sertraline (ZOLOFT) 100 MG tablet; Take 1 tablet (100 mg total) by mouth daily.  Dispense: 30 tablet; Refill: 3  5. Screening for STD (sexually transmitted disease) - Per pt request. Low  risk sexual behavior but still wants testing. - RPR - HSV(herpes simplex vrs) 1+2 ab-IgG - Urine cytology ancillary only  6. Encounter for screening for HIV - HIV antibody  7. Need for Tdap vaccination - Tdap vaccine greater than or equal to 7yo IM   Meds ordered this encounter  Medications  . ALPRAZolam (XANAX) 0.25 MG tablet    Sig: Take 1 tablet (0.25 mg total) by mouth 2 (two) times daily as needed for anxiety.    Dispense:  20 tablet    Refill:  0    Order Specific Question:   Supervising Provider    Answer:   Quentin Angst [id number]  . sertraline (ZOLOFT) 100 MG tablet    Sig: Take 1 tablet (100 mg total) by mouth daily.    Dispense:  30 tablet    Refill:  3    Order Specific Question:   Supervising Provider    Answer:   Quentin Angst [id number]  . metroNIDAZOLE (FLAGYL) 500 MG tablet    Sig: Take 1 tablet (500 mg total) by mouth 2 (two) times daily.    Dispense:  14 tablet    Refill:  0    Order Specific Question:   Supervising Provider    Answer:   Quentin Angst [id number]  . fluconazole (DIFLUCAN) 150 MG tablet    Sig: Take 1 tablet (150 mg total) by mouth once.    Dispense:  1 tablet    Refill:  0    Order Specific Question:   Supervising Provider    Answer:   Quentin Angst [[id number]]    Follow-up: Return in about 4 weeks (around 09/29/2016) for anxiety.   Loletta Specter PA

## 2016-09-01 NOTE — Progress Notes (Signed)
Patient is here for UTI SYMPTOMS.  Patient complains of lower abdominal pain being present for the past two days.  Patient has not taken medication today. Patient has eaten today.  Patient tolerated injection well today.

## 2016-09-01 NOTE — Patient Instructions (Addendum)
Generalized Anxiety Disorder, Adult Generalized anxiety disorder (GAD) is a mental health disorder. People with this condition constantly worry about everyday events. Unlike normal anxiety, worry related to GAD is not triggered by a specific event. These worries also do not fade or get better with time. GAD interferes with life functions, including relationships, work, and school. GAD can vary from mild to severe. People with severe GAD can have intense waves of anxiety with physical symptoms (panic attacks). What are the causes? The exact cause of GAD is not known. What increases the risk? This condition is more likely to develop in:  Women.  People who have a family history of anxiety disorders.  People who are very shy.  People who experience very stressful life events, such as the death of a loved one.  People who have a very stressful family environment. What are the signs or symptoms? People with GAD often worry excessively about many things in their lives, such as their health and family. They may also be overly concerned about:  Doing well at work.  Being on time.  Natural disasters.  Friendships. Physical symptoms of GAD include:  Fatigue.  Muscle tension or having muscle twitches.  Trembling or feeling shaky.  Being easily startled.  Feeling like your heart is pounding or racing.  Feeling out of breath or like you cannot take a deep breath.  Having trouble falling asleep or staying asleep.  Sweating.  Nausea, diarrhea, or irritable bowel syndrome (IBS).  Headaches.  Trouble concentrating or remembering facts.  Restlessness.  Irritability. How is this diagnosed? Your health care provider can diagnose GAD based on your symptoms and medical history. You will also have a physical exam. The health care provider will ask specific questions about your symptoms, including how severe they are, when they started, and if they come and go. Your health care  provider may ask you about your use of alcohol or drugs, including prescription medicines. Your health care provider may refer you to a mental health specialist for further evaluation. Your health care provider will do a thorough examination and may perform additional tests to rule out other possible causes of your symptoms. To be diagnosed with GAD, a person must have anxiety that:  Is out of his or her control.  Affects several different aspects of his or her life, such as work and relationships.  Causes distress that makes him or her unable to take part in normal activities.  Includes at least three physical symptoms of GAD, such as restlessness, fatigue, trouble concentrating, irritability, muscle tension, or sleep problems. Before your health care provider can confirm a diagnosis of GAD, these symptoms must be present more days than they are not, and they must last for six months or longer. How is this treated? The following therapies are usually used to treat GAD:  Medicine. Antidepressant medicine is usually prescribed for long-term daily control. Antianxiety medicines may be added in severe cases, especially when panic attacks occur.  Talk therapy (psychotherapy). Certain types of talk therapy can be helpful in treating GAD by providing support, education, and guidance. Options include:  Cognitive behavioral therapy (CBT). People learn coping skills and techniques to ease their anxiety. They learn to identify unrealistic or negative thoughts and behaviors and to replace them with positive ones.  Acceptance and commitment therapy (ACT). This treatment teaches people how to be mindful as a way to cope with unwanted thoughts and feelings.  Biofeedback. This process trains you to manage your body's response (  physiological response) through breathing techniques and relaxation methods. You will work with a therapist while machines are used to monitor your physical symptoms.  Stress  management techniques. These include yoga, meditation, and exercise. A mental health specialist can help determine which treatment is best for you. Some people see improvement with one type of therapy. However, other people require a combination of therapies. Follow these instructions at home:  Take over-the-counter and prescription medicines only as told by your health care provider.  Try to maintain a normal routine.  Try to anticipate stressful situations and allow extra time to manage them.  Practice any stress management or self-calming techniques as taught by your health care provider.  Do not punish yourself for setbacks or for not making progress.  Try to recognize your accomplishments, even if they are small.  Keep all follow-up visits as told by your health care provider. This is important. Contact a health care provider if:  Your symptoms do not get better.  Your symptoms get worse.  You have signs of depression, such as:  A persistently sad, cranky, or irritable mood.  Loss of enjoyment in activities that used to bring you joy.  Change in weight or eating.  Changes in sleeping habits.  Avoiding friends or family members.  Loss of energy for normal tasks.  Feelings of guilt or worthlessness. Get help right away if:  You have serious thoughts about hurting yourself or others. If you ever feel like you may hurt yourself or others, or have thoughts about taking your own life, get help right away. You can go to your nearest emergency department or call:  Your local emergency services (911 in the [place]  A suicide crisis helpline, such as the National Suicide Prevention Lifeline at 1-559-033-8451800-260-604-7571. This is open 24 hours a day. Summary  Generalized anxiety disorder (GAD) is a mental health disorder that involves worry that is not triggered by a specific event.  People with GAD often worry excessively about many things in their lives, such as their health and  family.  GAD may cause physical symptoms such as restlessness, trouble concentrating, sleep problems, frequent sweating, nausea, diarrhea, headaches, and trembling or muscle twitching.  A mental health specialist can help determine which treatment is best for you. Some people see improvement with one type of therapy. However, other people require a combination of therapies. This information is not intended to replace advice given to you by your health care provider. Make sure you discuss any questions you have with your health care provider. Document Released: 07/17/2012 Document Revised: 02/10/2016 Document Reviewed: 02/10/2016 Elsevier Interactive Patient Education  2017 Else[employer] Inc.  Bacterial Vaginosis Bacterial vaginosis is a vaginal infection that occurs when the normal balance of bacteria in the vagina is disrupted. It results from an overgrowth of certain bacteria. This is the most common vaginal infection among women ages 8115-44. Because bacterial vaginosis increases your risk for STIs (sexually transmitted infections), getting treated can help reduce your risk for chlamydia, gonorrhea, herpes, and HIV (human immunodeficiency virus). Treatment is also important for preventing complications in pregnant women, because this condition can cause an early (premature) delivery. What are the causes? This condition is caused by an increase in harmful bacteria that are normally present in small amounts in the vagina. However, the reason that the condition develops is not fully understood. What increases the risk? The following factors may make you more likely to develop this condition:  Having a new sexual partner or multiple sexual partners.  Having unprotected sex.  Douching.  Having an intrauterine device (IUD).  Smoking.  Drug and alcohol abuse.  Taking certain antibiotic medicines.  Being pregnant. You cannot get bacterial vaginosis from toilet seats, bedding, swimming pools, or  contact with objects around you. What are the signs or symptoms? Symptoms of this condition include:  Grey or white vaginal discharge. The discharge can also be watery or foamy.  A fish-like odor with discharge, especially after sexual intercourse or during menstruation.  Itching in and around the vagina.  Burning or pain with urination. Some women with bacterial vaginosis have no signs or symptoms. How is this diagnosed? This condition is diagnosed based on:  Your medical history.  A physical exam of the vagina.  Testing a sample of vaginal fluid under a microscope to look for a large amount of bad bacteria or abnormal cells. Your health care provider may use a cotton swab or a small wooden spatula to collect the sample. How is this treated? This condition is treated with antibiotics. These may be given as a pill, a vaginal cream, or a medicine that is put into the vagina (suppository). If the condition comes back after treatment, a second round of antibiotics may be needed. Follow these instructions at home: Medicines   Take over-the-counter and prescription medicines only as told by your health care provider.  Take or use your antibiotic as told by your health care provider. Do not stop taking or using the antibiotic even if you start to feel better. General instructions   If you have a female sexual partner, tell her that you have a vaginal infection. She should see her health care provider and be treated if she has symptoms. If you have a female sexual partner, he does not need treatment.  During treatment:  Avoid sexual activity until you finish treatment.  Do not douche.  Avoid alcohol as directed by your health care provider.  Avoid breastfeeding as directed by your health care provider.  Drink enough water and fluids to keep your urine clear or pale yellow.  Keep the area around your vagina and rectum clean.  Wash the area daily with warm water.  Wipe yourself  from front to back after using the toilet.  Keep all follow-up visits as told by your health care provider. This is important. How is this prevented?  Do not douche.  Wash the outside of your vagina with warm water only.  Use protection when having sex. This includes latex condoms and dental dams.  Limit how many sexual partners you have. To help prevent bacterial vaginosis, it is best to have sex with just one partner (monogamous).  Make sure you and your sexual partner are tested for STIs.  Wear cotton or cotton-lined underwear.  Avoid wearing tight pants and pantyhose, especially during summer.  Limit the amount of alcohol that you drink.  Do not use any products that contain nicotine or tobacco, such as cigarettes and e-cigarettes. If you need help quitting, ask your health care provider.  Do not use illegal drugs. Where to find more information:  Centers for Disease Control and Prevention: SolutionApps.co.za  American Sexual Health Association (ASHA): www.ashastd.org  [place] Department of Health and Health and safety inspector, Office on Women's Health: ConventionalMedicines.si or http://www.anderson-williamson.info/ Contact a health care provider if:  Your symptoms do not improve, even after treatment.  You have more discharge or pain when urinating.  You have a fever.  You have pain in your abdomen.  You have pain during  sex.  You have vaginal bleeding between periods. Summary  Bacterial vaginosis is a vaginal infection that occurs when the normal balance of bacteria in the vagina is disrupted.  Because bacterial vaginosis increases your risk for STIs (sexually transmitted infections), getting treated can help reduce your risk for chlamydia, gonorrhea, herpes, and HIV (human immunodeficiency virus). Treatment is also important for preventing complications in pregnant women, because the condition can cause an early (premature) delivery.  This condition is  treated with antibiotic medicines. These may be given as a pill, a vaginal cream, or a medicine that is put into the vagina (suppository). This information is not intended to replace advice given to you by your health care provider. Make sure you discuss any questions you have with your health care provider. Document Released: 03/22/2005 Document Revised: 12/06/2015 Document Reviewed: 12/06/2015 Elsevier Interactive Patient Education  2017 [employer].

## 2016-09-02 LAB — URINE CYTOLOGY ANCILLARY ONLY
Chlamydia: NEGATIVE
Neisseria Gonorrhea: NEGATIVE
Trichomonas: NEGATIVE

## 2016-09-03 LAB — RPR: RPR Ser Ql: NONREACTIVE

## 2016-09-03 LAB — HSV(HERPES SIMPLEX VRS) I + II AB-IGG: HSV 1 Glycoprotein G Ab, IgG: <0.91 index (ref 0.00–0.90)

## 2016-09-03 LAB — HIV ANTIBODY (ROUTINE TESTING W REFLEX): HIV Screen 4th Generation wRfx: NONREACTIVE

## 2016-09-06 LAB — URINE CYTOLOGY ANCILLARY ONLY
BACTERIAL VAGINITIS: NEGATIVE
Candida vaginitis: NEGATIVE

## 2016-11-03 ENCOUNTER — Other Ambulatory Visit: Payer: Self-pay | Admitting: Internal Medicine

## 2016-11-13 ENCOUNTER — Encounter (HOSPITAL_COMMUNITY): Payer: Self-pay | Admitting: Family Medicine

## 2016-11-13 ENCOUNTER — Ambulatory Visit (HOSPITAL_COMMUNITY)
Admission: EM | Admit: 2016-11-13 | Discharge: 2016-11-13 | Disposition: A | Discharge status: Home / Self Care | Payer: Medicaid Other | Attending: Family Medicine | Admitting: Family Medicine

## 2016-11-13 DIAGNOSIS — H9311 Tinnitus, right ear: Secondary | ICD-10-CM

## 2016-11-13 MED ORDER — AMOXICILLIN 875 MG PO TABS: 875.0000 mg | ORAL_TABLET | Freq: Two times a day (BID) | ORAL | 0 refills | Status: DC

## 2016-11-13 MED ORDER — FLUTICASONE PROPIONATE 50 MCG/ACT NA SUSP
1.0000 | Freq: Two times a day (BID) | NASAL | 2 refills | Status: DC
Start: 2016-11-13 — End: 2017-06-24

## 2016-11-13 NOTE — ED Triage Notes (Signed)
[facility] with a complaint of ringing in her right ear x 1 month and pain that started yesterday.

## 2016-11-13 NOTE — ED Provider Notes (Signed)
[facility]    CSN: [id number] Arrival date & time: 11/13/16  1206     History   Chief Complaint Chief Complaint  Patient presents with  . Tinnitus  . Otalgia    HPI Kylie Bailey is a 26 y.o. female.   This is a 26 year old woman who comes to the urgent care complaining of right ear discomfort.  Patient has a history of right ear infections.  Patient notes tinnitus in the right ear for one month. In the last day or 2 she's had "spasms" in that right auricular area. She's also had some discomfort in the right neck radiating down the sternocleidomastoid area.  She's not had any fever, nausea vomiting, cough. She's not on birth control at this point.  Patient is a full-time mother.      Past Medical History:  Diagnosis Date  . Medical history non-contributory   . Migraines     Patient Active Problem List   Diagnosis Date Noted  . Blurring of visual image 06/28/2016  . Stress 12/11/2015  . Panic attacks 12/11/2015  . Migraine with aura and without status migrainosus, not intractable 12/11/2015  . Obesity 12/11/2015  . Elevated BP without diagnosis of hypertension 12/11/2015    Past Surgical History:  Procedure Laterality Date  . TONSILLECTOMY      OB History    Gravida Para Term Preterm AB Living   1 1 1     1    SAB TAB Ectopic Multiple Live Births           1       Home Medications    Prior to Admission medications   Medication Sig Start Date End Date Taking? Authorizing Provider  verapamil (CALAN-SR) 120 MG CR tablet Take 1 tablet (120 mg total) by mouth at bedtime. 12/02/15  Yes Julieanne Manson, MD  amoxicillin (AMOXIL) 875 MG tablet Take 1 tablet (875 mg total) by mouth 2 (two) times daily. 11/13/16   Elvina Sidle, MD  fluticasone (FLONASE) 50 MCG/ACT nasal spray Place 1 spray into both nostrils 2 (two) times daily. 11/13/16   Elvina Sidle, MD    Family History History reviewed. No pertinent family history.  Social  History Social History  Substance Use Topics  . Smoking status: Current Every Day Smoker    Packs/day: 0.50    Types: Cigarettes  . Smokeless tobacco: Never Used  . Alcohol use No     Comment: occ     Allergies   Patient has no known allergies.   Review of Systems Review of Systems  HENT: Positive for ear pain, sore throat and tinnitus.   All other systems reviewed and are negative.    Physical Exam Triage Vital Signs ED Triage Vitals [11/13/16 1232]  Enc Vitals Group     BP 128/71     Pulse Rate 82     Resp 16     Temp 98.6 F (37 C)     Temp Source Oral     SpO2 99 %     Weight      Height      Head Circumference      Peak Flow      Pain Score      Pain Loc      Pain Edu?      Excl. in GC?    No data found.   Updated Vital Signs BP 128/71 (BP Location: Right Arm)   Pulse 82   Temp 98.6 F (37 C) (  Oral)   Resp 16   SpO2 99%    Physical Exam  Constitutional: She is oriented to person, place, and time. She appears well-developed and well-nourished.  HENT:  Left Ear: External ear normal.  Mouth/Throat: Oropharynx is clear and moist.  Scarred and retracted right eardrum  Eyes: Pupils are equal, round, and reactive to light. Conjunctivae are normal.  Neck: Normal range of motion. Neck supple.  Cardiovascular: Normal rate.   Pulmonary/Chest: Effort normal.  Musculoskeletal: Normal range of motion.  Neurological: She is alert and oriented to person, place, and time.  Skin: Skin is warm and dry.  Nursing note and vitals reviewed.    UC Treatments / Results  Labs (all labs ordered are listed, but only abnormal results are displayed) Labs Reviewed - No data to display  EKG  EKG Interpretation None       Radiology No results found.  Procedures Procedures (including critical care time)  Medications Ordered in UC Medications - No data to display   Initial Impression / Assessment and Plan / UC Course  I have reviewed the triage vital  signs and the nursing notes.  Pertinent labs & imaging results that were available during my care of the patient were reviewed by me and considered in my medical decision making (see chart for details).  Patient's symptoms are consistent with serous otitis and possibly sinusitis. Because her symptoms been going on for a month, seem predisposed her with an antibiotic along with nasal steroids.  Final Clinical Impressions(s) / UC Diagnoses   Final diagnoses:  Tinnitus of right ear    New Prescriptions New Prescriptions   AMOXICILLIN (AMOXIL) 875 MG TABLET    Take 1 tablet (875 mg total) by mouth 2 (two) times daily.   FLUTICASONE (FLONASE) 50 MCG/ACT NASAL SPRAY    Place 1 spray into both nostrils 2 (two) times daily.     Controlled Substance Prescriptions None   Elvina SidleLauenstein, Dianelys Scinto, MD 11/13/16 1250

## 2016-11-13 NOTE — Discharge Instructions (Signed)
Keep your appointment with your primary care physician if you're not significantly improved over the next several days.

## 2016-11-16 ENCOUNTER — Ambulatory Visit (INDEPENDENT_AMBULATORY_CARE_PROVIDER_SITE_OTHER): Payer: Medicaid Other | Admitting: Physician Assistant

## 2016-12-03 ENCOUNTER — Telehealth (INDEPENDENT_AMBULATORY_CARE_PROVIDER_SITE_OTHER): Payer: Self-pay | Admitting: Physician Assistant

## 2016-12-03 ENCOUNTER — Other Ambulatory Visit (INDEPENDENT_AMBULATORY_CARE_PROVIDER_SITE_OTHER): Payer: Self-pay | Admitting: Physician Assistant

## 2016-12-03 DIAGNOSIS — I10 Essential (primary) hypertension: Secondary | ICD-10-CM | POA: Insufficient documentation

## 2016-12-03 MED ORDER — VERAPAMIL HCL ER 120 MG PO TBCR: 120.0000 mg | EXTENDED_RELEASE_TABLET | Freq: Every day | ORAL | 11 refills | Status: DC

## 2016-12-03 NOTE — Telephone Encounter (Signed)
FWD to PCP. Kylie Bailey S Kylie Bailey, CMA  

## 2016-12-03 NOTE — Telephone Encounter (Signed)
Patient called requesting a refill of Verapimil 120 mg she knows that PA PueblitosGomez didn't prescribe that but they talked in the previous appt . Patient use  [facility]  [address]

## 2016-12-17 ENCOUNTER — Encounter (HOSPITAL_COMMUNITY): Payer: Self-pay | Admitting: Family Medicine

## 2016-12-17 ENCOUNTER — Ambulatory Visit (HOSPITAL_COMMUNITY)
Admission: EM | Admit: 2016-12-17 | Discharge: 2016-12-17 | Disposition: A | Discharge status: Home / Self Care | Payer: Medicaid Other | Attending: Family Medicine | Admitting: Family Medicine

## 2016-12-17 DIAGNOSIS — S93401A Sprain of unspecified ligament of right ankle, initial encounter: Secondary | ICD-10-CM

## 2016-12-17 DIAGNOSIS — IMO0001 Reserved for inherently not codable concepts without codable children: Secondary | ICD-10-CM

## 2016-12-17 DIAGNOSIS — S86011A Strain of right Achilles tendon, initial encounter: Secondary | ICD-10-CM

## 2016-12-17 NOTE — ED Notes (Signed)
Pt triaged by provider  

## 2016-12-17 NOTE — Discharge Instructions (Signed)
Wear the cam walker when walking outside for two weeks.  (Remove this when driving) Gently move the leg when lying or sitting for awhile Ibuprofen and ice for the next couple days.

## 2016-12-17 NOTE — ED Provider Notes (Signed)
[facility]    CSN: [id number] Arrival date & time: 12/17/16  1005     History   Chief Complaint No chief complaint on file.   HPI Kylie Bailey is a 26 y.o. female.   This is a 26 yo unemployed married woman who stepped in a hole yesterday and injured right achilles and ankle.  She continued to work in the yard in [employer] [place].  Her husband left town yesterday.  Patient is able to bear weight and has minimal bony tenderness.      Past Medical History:  Diagnosis Date  . Medical history non-contributory   . Migraines     Patient Active Problem List   Diagnosis Date Noted  . Essential hypertension 12/03/2016  . Blurring of visual image 06/28/2016  . Stress 12/11/2015  . Panic attacks 12/11/2015  . Migraine with aura and without status migrainosus, not intractable 12/11/2015  . Obesity 12/11/2015  . Elevated BP without diagnosis of hypertension 12/11/2015    Past Surgical History:  Procedure Laterality Date  . TONSILLECTOMY      OB History    Gravida Para Term Preterm AB Living   SAB TAB Ectopic Multiple Live Births           1       Home Medications    Prior to Admission medications   Medication Sig Start Date End Date Taking? Authorizing Provider  fluticasone (FLONASE) 50 MCG/ACT nasal spray Place 1 spray into both nostrils 2 (two) times daily. 11/13/16   Elvina Sidle, MD  verapamil (CALAN-SR) 120 MG CR tablet Take 1 tablet (120 mg total) by mouth at bedtime. 12/03/16   Loletta Specter, PA-C    Family History No family history on file.  Social History Social History  Substance Use Topics  . Smoking status: Current Every Day Smoker    Packs/day: 0.50    Types: Cigarettes  . Smokeless tobacco: Never Used  . Alcohol use No     Comment: occ     Allergies   Patient has no known allergies.   Review of Systems Review of Systems  Musculoskeletal: Positive for gait problem and joint  swelling.  All other systems reviewed and are negative.    Physical Exam Triage Vital Signs ED Triage Vitals  Enc Vitals Group     BP      Pulse      Resp      Temp      Temp src      SpO2      Weight      Height      Head Circumference      Peak Flow      Pain Score      Pain Loc      Pain Edu?      Excl. in GC?    No data found.   Updated Vital Signs BP 125/77   Pulse 82   Temp 98.5 F (36.9 C)   Resp 18   SpO2 100%    Physical Exam  Constitutional: She is oriented to person, place, and time. She appears well-developed and well-nourished.  HENT:  Right Ear: External ear normal.  Left Ear: External ear normal.  Mouth/Throat: Oropharynx is clear and moist.  Eyes: Pupils are equal, round, and reactive to light. Conjunctivae are normal.  Neck: Normal range of motion. Neck supple.  Pulmonary/Chest: Effort normal.  Musculoskeletal:  Normal range of motion. She exhibits tenderness. She exhibits no deformity.  Swollen right lateral malleolus with minimal tenderness.  Tender achilles Good plantar flexion strength.   Neurological: She is alert and oriented to person, place, and time.  Skin: Skin is warm and dry.  Nursing note and vitals reviewed.    UC Treatments / Results  Labs (all labs ordered are listed, but only abnormal results are displayed) Labs Reviewed - No data to display  EKG  EKG Interpretation None       Radiology No results found.  Procedures Procedures (including critical care time)  Medications Ordered in UC Medications - No data to display   Initial Impression / Assessment and Plan / UC Course  I have reviewed the triage vital signs and the nursing notes.  Pertinent labs & imaging results that were available during my care of the patient were reviewed by me and considered in my medical decision making (see chart for details).     Final Clinical Impressions(s) / UC Diagnoses   Final diagnoses:  Strain of right Achilles  tendon, initial encounter  First degree ankle sprain, right, initial encounter    New Prescriptions Current Discharge Medication List       Controlled Substance Prescriptions [place] Controlled Substance Registry consulted? Not Applicable   Elvina Sidle, MD 12/17/16 1023

## 2016-12-20 ENCOUNTER — Emergency Department (HOSPITAL_COMMUNITY)
Admission: EM | Admit: 2016-12-20 | Discharge: 2016-12-20 | Discharge status: Absent without leave | Payer: Medicaid Other

## 2016-12-20 ENCOUNTER — Encounter (INDEPENDENT_AMBULATORY_CARE_PROVIDER_SITE_OTHER): Payer: Self-pay | Admitting: Physician Assistant

## 2016-12-20 ENCOUNTER — Ambulatory Visit (INDEPENDENT_AMBULATORY_CARE_PROVIDER_SITE_OTHER): Payer: Medicaid Other | Admitting: Physician Assistant

## 2016-12-20 VITALS — BP 125/85 | HR 92 | Temp 98.1°F | Wt 298.4 lb

## 2016-12-20 DIAGNOSIS — Z9189 Other specified personal risk factors, not elsewhere classified: Secondary | ICD-10-CM | POA: Diagnosis not present

## 2016-12-20 DIAGNOSIS — M25571 Pain in right ankle and joints of right foot: Secondary | ICD-10-CM

## 2016-12-20 DIAGNOSIS — S86011A Strain of right Achilles tendon, initial encounter: Secondary | ICD-10-CM | POA: Diagnosis not present

## 2016-12-20 LAB — POCT URINE PREGNANCY: PREG TEST UR: NEGATIVE

## 2016-12-20 MED ORDER — ACETAMINOPHEN 500 MG PO TABS: 1000.0000 mg | ORAL_TABLET | Freq: Three times a day (TID) | ORAL | 0 refills | Status: AC | PRN

## 2016-12-20 NOTE — ED Notes (Signed)
Called for triage no answer  

## 2016-12-20 NOTE — ED Notes (Signed)
Pt stated that she wanted an outpatient xray. Told the Pt her PCP did not call in for an outpatient xray and she would have to be seen in the ER first. Pt stated she does not have time to wait to be seen in the ER. Told Pt she would probably be able to go to our Fast [facility] area, but again Pt stated she did not have time to wait. Pt stated she was going to follow up with her PCP again in the morning.

## 2016-12-20 NOTE — Patient Instructions (Signed)
Ankle Sprain An ankle sprain is a stretch or tear in one of the tough, fiber-like tissues (ligaments) in the ankle. The ligaments in your ankle help to hold the bones of the ankle together. What are the causes? This condition is often caused by stepping on or falling on the outer edge of the foot. What increases the risk? This condition is more likely to develop in people who play sports. What are the signs or symptoms? Symptoms of this condition include:  Pain in your ankle.  Swelling.  Bruising. Bruising may develop right after you sprain your ankle or 1-2 days later.  Trouble standing or walking, especially when you turn or change directions. How is this diagnosed? This condition is diagnosed with a physical exam. During the exam, your health care provider will press on certain parts of your foot and ankle and try to move them in certain ways. X-rays may be taken to see how severe the sprain is and to check for broken bones. How is this treated? This condition may be treated with:  A brace. This is used to keep the ankle from moving until it heals.  An elastic bandage. This is used to support the ankle.  Crutches.  Pain medicine.  Surgery. This may be needed if the sprain is severe.  Physical therapy. This may help to improve the range of motion in the ankle. Follow these instructions at home:  Rest your ankle.  Take over-the-counter and prescription medicines only as told by your health care provider.  For 2-3 days, keep your ankle raised (elevated) above the level of your heart as much as possible.  If directed, apply ice to the area:  Put ice in a plastic bag.  Place a towel between your skin and the bag.  Leave the ice on for 20 minutes, 2-3 times a day.  If you were given a brace:  Wear it as directed.  Remove it to shower or bathe.  Try not to move your ankle much, but wiggle your toes from time to time. This helps to prevent swelling.  If you were  given an elastic bandage (dressing):  Remove it to shower or bathe.  Try not to move your ankle much, but wiggle your toes from time to time. This helps to prevent swelling.  Adjust the dressing to make it more comfortable if it feels too tight.  Loosen the dressing if you have numbness or tingling in your foot, or if your foot becomes cold and blue.  If you have crutches, use them as told by your health care provider. Continue to use them until you can walk without feeling pain in your ankle. Contact a health care provider if:  You have rapidly increasing bruising or swelling.  Your pain is not relieved with medicine. Get help right away if:  Your toes or foot becomes numb or blue.  You have severe pain that gets worse. This information is not intended to replace advice given to you by your health care provider. Make sure you discuss any questions you have with your health care provider. Document Released: 03/22/2005 Document Revised: 07/30/2015 Document Reviewed: 10/22/2014 Elsevier Interactive Patient Education  2017 Elsevier Inc.  

## 2016-12-20 NOTE — Progress Notes (Signed)
Subjective:  Patient ID: Joan Mayans, female    DOB: [birth year]  Age: 26 y.o. MRN: [id number]  CC: hospital f/u  HPI Acelyn Basham is a 26 y.o. female with a medical history of anxiety presents on hospital f/u of strained right achilles tendon. Injury happened four days ago after stepping into a hole while preparing the yard for [place]. She was able to bear weight when she went to the ED.  No imaging was conducted. Advised by the ED provider to use a CAM walker and to take ibuprofen.     In the interim, reports she is able to bear weight better, range of motion is better, and anterior ankle pain has decreased. Says the CAM walker is "not working" for her and would rather wear her sneakers. However, she is concerned about severe pain along the medial malleolus and along the distal aspect of the achilles tendon. Currently uses the lateral edge of right foot to support weight. Does not endorse paresthesia or cyanosis.        Outpatient Medications Prior to Visit  Medication Sig Dispense Refill  . fluticasone (FLONASE) 50 MCG/ACT nasal spray Place 1 spray into both nostrils 2 (two) times daily. 15.8 g 2  . verapamil (CALAN-SR) 120 MG CR tablet Take 1 tablet (120 mg total) by mouth at bedtime. 30 tablet 11   No facility-administered medications prior to visit.      ROS Review of Systems  Constitutional: Negative for chills, fever and malaise/fatigue.  Eyes: Negative for blurred vision.  Respiratory: Negative for shortness of breath.   Cardiovascular: Negative for chest pain and palpitations.  Gastrointestinal: Negative for abdominal pain and nausea.  Genitourinary: Negative for dysuria and hematuria.  Musculoskeletal: Positive for joint pain. Negative for myalgias.  Skin: Negative for rash.  Neurological: Negative for tingling and headaches.  Psychiatric/Behavioral: Negative for depression. The patient is not nervous/anxious.     Objective:  There were no vitals  taken for this visit.  BP/Weight 12/17/2016 11/13/2016 09/01/2016  Systolic BP 125 128 139  Diastolic BP 77 71 79  Wt. (Lbs) - - 294  BMI - - 43.42      Physical Exam  Constitutional: She is oriented to person, place, and time.  Well developed, obese, NAD, polite  HENT:  Head: Normocephalic and atraumatic.  Eyes: No scleral icterus.  Pulmonary/Chest: Effort normal.  Musculoskeletal: She exhibits no edema or deformity.  Mild to moderately decreased aROM of the right ankle secondary to pain. Moderate TTP at the distal aspect of the medial malleolus, no appreciable edema or ecchymosis of the medial malleolus. Moderate TTP along the distal aspect of the right achilles, no appreciable edema or ecchymosis of the medial malleolus.  Neurological: She is alert and oriented to person, place, and time. No cranial nerve deficit. Coordination normal.  Skin: Skin is warm and dry. No rash noted. No erythema. No pallor.  Large area of mild bruising along the lateral aspect of the right lower extremity.  Psychiatric: She has a normal mood and affect. Her behavior is normal. Thought content normal.  Vitals reviewed.    Assessment & Plan:   1. Acute right ankle pain - DG Ankle Complete Right; Future - Begin acetaminophen (TYLENOL) 500 MG tablet; Take 2 tablets (1,000 mg total) by mouth every 8 (eight) hours as needed.  Dispense: 21 tablet; Refill: 0 - DME Crutches - Advised to avoid weight bearing until we can see if there is a fracture.   2. Strain  of right Achilles tendon, initial encounter - US SOFT TISSUE LOWER EXTREMITY LIMITED RIGHT (NON-VASCULAR); Future - DME Crutches  3. At risk for exposure to radiation - POCT urine pregnancy negative  Meds ordered this encounter  Medications  . acetaminophen (TYLENOL) 500 MG tablet    Sig: Take 2 tablets (1,000 mg total) by mouth every 8 (eight) hours as needed.    Dispense:  21 tablet    Refill:  0    Order Specific Question:   Supervising  Provider    Answer:   Quentin Angst [id number]    Follow-up: Return if symptoms worsen or fail to improve.   Loletta Specter PA

## 2016-12-22 ENCOUNTER — Ambulatory Visit (HOSPITAL_COMMUNITY)
Admission: RE | Admit: 2016-12-22 | Discharge: 2016-12-22 | Disposition: A | Discharge status: Home / Self Care | Payer: Medicaid Other | Source: Ambulatory Visit | Attending: Physician Assistant | Admitting: Physician Assistant

## 2016-12-22 DIAGNOSIS — M25571 Pain in right ankle and joints of right foot: Secondary | ICD-10-CM | POA: Diagnosis present

## 2016-12-23 ENCOUNTER — Telehealth (INDEPENDENT_AMBULATORY_CARE_PROVIDER_SITE_OTHER): Payer: Self-pay

## 2016-12-23 NOTE — Telephone Encounter (Signed)
-----   Message from Loletta Specter, PA-C sent at 12/22/2016  6:11 PM EDT ----- Normal right ankle.

## 2016-12-23 NOTE — Telephone Encounter (Signed)
Patient provided with normal xray results. Patient aware the medicaid determination for coverage of the ordered US is still pending, and that she will be contacted when a final answer is available. Maryjean Morn, CMA

## 2017-01-06 ENCOUNTER — Ambulatory Visit (HOSPITAL_COMMUNITY)
Admission: RE | Admit: 2017-01-06 | Discharge: 2017-01-06 | Disposition: A | Discharge status: Home / Self Care | Payer: Medicaid Other | Source: Ambulatory Visit | Attending: Physician Assistant | Admitting: Physician Assistant

## 2017-01-06 DIAGNOSIS — S86011A Strain of right Achilles tendon, initial encounter: Secondary | ICD-10-CM | POA: Diagnosis present

## 2017-01-06 DIAGNOSIS — S86021A Laceration of right Achilles tendon, initial encounter: Secondary | ICD-10-CM | POA: Insufficient documentation

## 2017-01-06 DIAGNOSIS — X58XXXA Exposure to other specified factors, initial encounter: Secondary | ICD-10-CM | POA: Insufficient documentation

## 2017-01-07 ENCOUNTER — Telehealth (INDEPENDENT_AMBULATORY_CARE_PROVIDER_SITE_OTHER): Payer: Self-pay

## 2017-01-07 ENCOUNTER — Other Ambulatory Visit (INDEPENDENT_AMBULATORY_CARE_PROVIDER_SITE_OTHER): Payer: Self-pay | Admitting: Physician Assistant

## 2017-01-07 DIAGNOSIS — S86011A Strain of right Achilles tendon, initial encounter: Secondary | ICD-10-CM

## 2017-01-07 NOTE — Telephone Encounter (Signed)
-----   Message from Loletta Specter, PA-C sent at 01/07/2017  8:51 AM EDT ----- Partial tear of the distal right Achilles tendon. No evidence of complete tear or significant retraction. I will send to orthopedic specialist. Keep using crutches, no weight bearing.

## 2017-01-07 NOTE — Telephone Encounter (Signed)
Patient is aware of ultrasound results. Maryjean Morn, CMA

## 2017-01-13 ENCOUNTER — Ambulatory Visit (INDEPENDENT_AMBULATORY_CARE_PROVIDER_SITE_OTHER): Payer: Self-pay | Admitting: Orthopaedic Surgery

## 2017-01-17 ENCOUNTER — Encounter (INDEPENDENT_AMBULATORY_CARE_PROVIDER_SITE_OTHER): Payer: Self-pay | Admitting: Orthopaedic Surgery

## 2017-01-17 ENCOUNTER — Ambulatory Visit (INDEPENDENT_AMBULATORY_CARE_PROVIDER_SITE_OTHER): Payer: Medicaid Other | Admitting: Orthopaedic Surgery

## 2017-01-17 ENCOUNTER — Ambulatory Visit (INDEPENDENT_AMBULATORY_CARE_PROVIDER_SITE_OTHER): Payer: Medicaid Other

## 2017-01-17 DIAGNOSIS — M25571 Pain in right ankle and joints of right foot: Secondary | ICD-10-CM

## 2017-01-17 NOTE — Progress Notes (Signed)
Office Visit Note   Patient: Kylie Bailey           Date of Birth: 11/03/1990           MRN: [id number] Visit Date: 01/17/2017              Requested by: Loletta Specter, PA-C [address] [place], [place] [address] PCP: Loletta Specter, PA-C   Assessment & Plan: Visit Diagnoses:  1. Pain in right ankle and joints of right foot     Plan: Patient has an Achilles strain based on ultrasound and lateral ankle ligament sprain. Recommend right ASO brace and physical therapy. Icing as needed. NSAIDs as needed. Questions encouraged and answered. Follow-up as needed.  Follow-Up Instructions: Return if symptoms worsen or fail to improve.   Orders:  Orders Placed This Encounter  Procedures  . XR Ankle Complete Right   No orders of the defined types were placed in this encounter.     Procedures: No procedures performed   Clinical Data: No additional findings.   Subjective: Chief Complaint  Patient presents with  . Right Ankle - Pain, Follow-up    Patient is a 26 year old female comes in with a history of right ankle pain. She fell and twisted her ankle. She is overall feeling better but not 100%. Pain is worse with weightbearing and activity and going upstairs. She has trouble wearing flat shoes. She had ultrasound which showed a partial tear of her Achilles. She is mainly complaining of more pain in the lateral ankle ligaments. Pain does not radiate. Denies any numbness and tingling. The pain throbs and is worse at night. She has not tried physical therapy. She was given a Cam Walker but she has not been compliant with this.    Review of Systems  Constitutional: Negative.   HENT: Negative.   Eyes: Negative.   Respiratory: Negative.   Cardiovascular: Negative.   Endocrine: Negative.   Musculoskeletal: Negative.   Neurological: Negative.   Hematological: Negative.   Psychiatric/Behavioral: Negative.   All other systems reviewed and are  negative.    Objective: Vital Signs: There were no vitals taken for this visit.  Physical Exam  Constitutional: She is oriented to person, place, and time. She appears well-developed and well-nourished.  HENT:  Head: Normocephalic and atraumatic.  Eyes: EOM are normal.  Neck: Neck supple.  Pulmonary/Chest: Effort normal.  Abdominal: Soft.  Neurological: She is alert and oriented to person, place, and time.  Skin: Skin is warm. Capillary refill takes less than 2 seconds.  Psychiatric: She has a normal mood and affect. Her behavior is normal. Judgment and thought content normal.  Nursing note and vitals reviewed.   Ortho Exam Right ankle exam shows more tenderness and swelling over the lateral ankle ligaments. Ankle exam is stable. Achilles tendon is continuous to palpation. Mild discomfort with palpation over the Achilles. Good plantarflexion strength. Normal Thompson's test. Specialty Comments:  No specialty comments available.  Imaging: Xr Ankle Complete Right  Result Date: 01/17/2017 negative    PMFS History: Patient Active Problem List   Diagnosis Date Noted  . Essential hypertension 12/03/2016  . Blurring of visual image 06/28/2016  . Stress 12/11/2015  . Panic attacks 12/11/2015  . Migraine with aura and without status migrainosus, not intractable 12/11/2015  . Obesity 12/11/2015  . Elevated BP without diagnosis of hypertension 12/11/2015   Past Medical History:  Diagnosis Date  . Medical history non-contributory   . Migraines  No family history on file.  Past Surgical History:  Procedure Laterality Date  . TONSILLECTOMY     Social History   Occupational History  . Not on file.   Social History Main Topics  . Smoking status: Current Every Day Smoker    Packs/day: 0.50    Types: Cigarettes  . Smokeless tobacco: Never Used  . Alcohol use No     Comment: occ  . Drug use: No  . Sexual activity: Yes    Birth control/ protection: None

## 2017-02-10 ENCOUNTER — Ambulatory Visit (HOSPITAL_COMMUNITY)
Admission: EM | Admit: 2017-02-10 | Discharge: 2017-02-10 | Disposition: A | Discharge status: Home / Self Care | Payer: Medicaid Other | Attending: Family Medicine | Admitting: Family Medicine

## 2017-02-10 ENCOUNTER — Encounter (HOSPITAL_COMMUNITY): Payer: Self-pay | Admitting: Family Medicine

## 2017-02-10 DIAGNOSIS — J9801 Acute bronchospasm: Secondary | ICD-10-CM | POA: Diagnosis not present

## 2017-02-10 DIAGNOSIS — J069 Acute upper respiratory infection, unspecified: Secondary | ICD-10-CM

## 2017-02-10 DIAGNOSIS — H1032 Unspecified acute conjunctivitis, left eye: Secondary | ICD-10-CM | POA: Diagnosis not present

## 2017-02-10 MED ORDER — IPRATROPIUM-ALBUTEROL 0.5-2.5 (3) MG/3ML IN SOLN
3.0000 mL | Freq: Once | RESPIRATORY_TRACT | Status: AC
  Administered 2017-02-10: 3 mL via RESPIRATORY_TRACT

## 2017-02-10 MED ORDER — ALBUTEROL SULFATE HFA 108 (90 BASE) MCG/ACT IN AERS: 2.0000 | INHALATION_SPRAY | RESPIRATORY_TRACT | 0 refills | Status: DC | PRN

## 2017-02-10 MED ORDER — PREDNISONE 50 MG PO TABS: ORAL_TABLET | ORAL | 0 refills | Status: DC

## 2017-02-10 MED ORDER — TRIAMCINOLONE ACETONIDE 40 MG/ML IJ SUSP
40.0000 mg | Freq: Once | INTRAMUSCULAR | Status: AC
  Administered 2017-02-10: 40 mg via INTRAMUSCULAR

## 2017-02-10 MED ORDER — ERYTHROMYCIN 5 MG/GM OP OINT: TOPICAL_OINTMENT | OPHTHALMIC | 0 refills | Status: DC

## 2017-02-10 MED ORDER — TRIAMCINOLONE ACETONIDE 40 MG/ML IJ SUSP
INTRAMUSCULAR | Status: AC
  Filled 2017-02-10: qty 1

## 2017-02-10 MED ORDER — IPRATROPIUM-ALBUTEROL 0.5-2.5 (3) MG/3ML IN SOLN
RESPIRATORY_TRACT | Status: AC
  Filled 2017-02-10: qty 3

## 2017-02-10 NOTE — ED Provider Notes (Signed)
[facility]    CSN: [id number] Arrival date & time: 02/10/17  1038     History   Chief Complaint Chief Complaint  Patient presents with  . Nasal Congestion  . Eye Problem    HPI Kylie Bailey is a 26 y.o. female.   26 year old female complaining of congestion for 3 weeks. She points to her chest as the source of rest of the congestion. At the outset she had upper respiratory congestion with runny nose and stuffiness. She is also complaining of left eye redness and itching and drainage. She is a smoker. No history of asthma.      Past Medical History:  Diagnosis Date  . Medical history non-contributory   . Migraines     Patient Active Problem List   Diagnosis Date Noted  . Essential hypertension 12/03/2016  . Blurring of visual image 06/28/2016  . Stress 12/11/2015  . Panic attacks 12/11/2015  . Migraine with aura and without status migrainosus, not intractable 12/11/2015  . Obesity 12/11/2015  . Elevated BP without diagnosis of hypertension 12/11/2015    Past Surgical History:  Procedure Laterality Date  . TONSILLECTOMY      OB History    Gravida Para Term Preterm AB Living   1 1 1     1    SAB TAB Ectopic Multiple Live Births           1       Home Medications    Prior to Admission medications   Medication Sig Start Date End Date Taking? Authorizing Provider  albuterol (PROVENTIL HFA;VENTOLIN HFA) 108 (90 Base) MCG/ACT inhaler Inhale 2 puffs every 4 (four) hours as needed into the lungs for wheezing or shortness of breath. 02/10/17   Hayden Rasmussen, NP  erythromycin ophthalmic ointment Place a 1/2 inch ribbon of ointment into the left lower eyelid tid x 5 d. 02/10/17   Hayden Rasmussen, NP  fluticasone (FLONASE) 50 MCG/ACT nasal spray Place 1 spray into both nostrils 2 (two) times daily. 11/13/16   Elvina Sidle, MD  predniSONE (DELTASONE) 50 MG tablet 1 tab po daily for 6 days. Take with food. Start 02/11/2017. 02/10/17   Hayden Rasmussen,  NP  verapamil (CALAN-SR) 120 MG CR tablet Take 1 tablet (120 mg total) by mouth at bedtime. 12/03/16   Loletta Specter, PA-C    Family History History reviewed. No pertinent family history.  Social History Social History   Tobacco Use  . Smoking status: Current Every Day Smoker    Packs/day: 0.50    Types: Cigarettes  . Smokeless tobacco: Never Used  Substance Use Topics  . Alcohol use: No    Comment: occ  . Drug use: No     Allergies   Patient has no known allergies.   Review of Systems Review of Systems  Constitutional: Positive for activity change. Negative for appetite change, chills, fatigue and fever.       States she has had an occasional low-grade fever off and on but not measured.  HENT: Positive for congestion and postnasal drip. Negative for facial swelling and rhinorrhea.   Eyes: Negative.   Respiratory: Positive for cough and shortness of breath.   Cardiovascular: Negative.   Gastrointestinal: Negative.   Musculoskeletal: Negative for neck pain and neck stiffness.  Skin: Negative for pallor and rash.  Neurological: Negative.   All other systems reviewed and are negative.    Physical Exam Triage Vital Signs ED Triage Vitals  Enc Vitals Group  BP 02/10/17 1110 122/86     Pulse Rate 02/10/17 1110 90     Resp 02/10/17 1110 18     Temp 02/10/17 1110 98.8 F (37.1 C)     Temp src --      SpO2 02/10/17 1110 100 %     Weight --      Height --      Head Circumference --      Peak Flow --      Pain Score 02/10/17 1108 6     Pain Loc --      Pain Edu? --      Excl. in GC? --    No data found.  Updated Vital Signs BP 122/86   Pulse 90   Temp 98.8 F (37.1 C)   Resp 18   SpO2 100%   Visual Acuity Right Eye Distance:   Left Eye Distance:   Bilateral Distance:    Right Eye Near:   Left Eye Near:    Bilateral Near:     Physical Exam  Constitutional: She is oriented to person, place, and time. She appears well-developed and  well-nourished. No distress.  HENT:  Mouth/Throat: No oropharyngeal exudate.  Bilateral TMs are retracted.  oropharynx with clear PND, mild cobblestoning otherwise normal.  Eyes: EOM are normal. Pupils are equal, round, and reactive to light.  Left eye with lower lid conjunctival erythema and minor swelling. Minor scleral erythema. Evidence of thick or mucoid type drainage. Anterior chamber is clear.  Neck: Normal range of motion. Neck supple.  Cardiovascular: Normal rate and regular rhythm.  Pulmonary/Chest: Breath sounds normal. No respiratory distress. She has no rales.  Diminished air movement bilaterally. Prolonged expiratory phase. Few inspiratory wheezes. Light expiratory wheezes.  Musculoskeletal: Normal range of motion. She exhibits no edema.  Lymphadenopathy:    She has no cervical adenopathy.  Neurological: She is alert and oriented to person, place, and time.  Skin: Skin is warm and dry. No rash noted.  Psychiatric: She has a normal mood and affect.  Nursing note and vitals reviewed.    UC Treatments / Results  Labs (all labs ordered are listed, but only abnormal results are displayed) Labs Reviewed - No data to display  EKG  EKG Interpretation None       Radiology No results found.  Procedures Procedures (including critical care time)  Medications Ordered in UC Medications  ipratropium-albuterol (DUONEB) 0.5-2.5 (3) MG/3ML nebulizer solution 3 mL (3 mLs Nebulization Given 02/10/17 1152)  triamcinolone acetonide (KENALOG-40) injection 40 mg (40 mg Intramuscular Given 02/10/17 1152)     Initial Impression / Assessment and Plan / UC Course  I have reviewed the triage vital signs and the nursing notes.  Pertinent labs & imaging results that were available during my care of the patient were reviewed by me and considered in my medical decision making (see chart for details).    Use the eye ointment as directed. Take the prednisone daily starting tomorrow. Take  with food use the albuterol inhaler 2 puffs every 4 hours for cough and wheeze. Sudafed PE 10 mg every 4 to 6 hours as needed for congestion Allegra or Zyrtec daily as needed for drainage and runny nose. For stronger antihistamine may take Chlor-Trimeton 2 to 4 mg every 4 to 6 hours, may cause drowsiness.S Saline nasal spray used frequently. Drink plenty of fluids and stay well-hydrated.  Post DuoNeb patient states she feels better, breathes better and no longer coughing with deep breathing. Lungs  with much improved air movement, rare wheeze.   Final Clinical Impressions(s) / UC Diagnoses   Final diagnoses:  Acute conjunctivitis of left eye, unspecified acute conjunctivitis type  Upper respiratory tract infection, unspecified type  Bronchospasm    ED Discharge Orders        Ordered    albuterol (PROVENTIL HFA;VENTOLIN HFA) 108 (90 Base) MCG/ACT inhaler  Every 4 hours PRN     02/10/17 1158    predniSONE (DELTASONE) 50 MG tablet     02/10/17 1158    erythromycin ophthalmic ointment     02/10/17 1158       Controlled Substance Prescriptions [place] Controlled Substance Registry consulted? Not Applicable   Hayden RasmussenMabe, Ridge Lafond, NP 02/10/17 1217

## 2017-02-10 NOTE — ED Triage Notes (Signed)
Pt here for cough, congestion and left eye redness and drainage.

## 2017-02-10 NOTE — Discharge Instructions (Addendum)
Use the eye ointment as directed. Take the prednisone daily starting tomorrow. Take with food use the albuterol inhaler 2 puffs every 4 hours for cough and wheeze. Sudafed PE 10 mg every 4 to 6 hours as needed for congestion Allegra or Zyrtec daily as needed for drainage and runny nose. For stronger antihistamine may take Chlor-Trimeton 2 to 4 mg every 4 to 6 hours, may cause drowsiness.S Saline nasal spray used frequently. Drink plenty of fluids and stay well-hydrated.

## 2017-02-27 ENCOUNTER — Encounter (HOSPITAL_COMMUNITY): Payer: Self-pay | Admitting: Emergency Medicine

## 2017-02-27 ENCOUNTER — Ambulatory Visit (HOSPITAL_COMMUNITY)
Admission: EM | Admit: 2017-02-27 | Discharge: 2017-02-27 | Disposition: A | Discharge status: Home / Self Care | Payer: Medicaid Other | Attending: Family Medicine | Admitting: Family Medicine

## 2017-02-27 ENCOUNTER — Other Ambulatory Visit: Payer: Self-pay

## 2017-02-27 DIAGNOSIS — H04202 Unspecified epiphora, left lacrimal gland: Secondary | ICD-10-CM | POA: Diagnosis not present

## 2017-02-27 MED ORDER — OLOPATADINE HCL 0.1 % OP SOLN: 1.0000 [drp] | Freq: Every day | OPHTHALMIC | 0 refills | Status: AC

## 2017-02-27 NOTE — ED Triage Notes (Signed)
Pt states she had pink eye a few weeks ago, and now she cant see very well out of her L eye. Pt seeing double out of her L eye, unable to focus. No redness. Pt wearing glasses.

## 2017-02-27 NOTE — Discharge Instructions (Signed)
Will try eye drop for tearing of left eye. Please call tomorrow to make a follow up appointment later this week with your eye doctor for a recheck of your left eye. If develop pain, redness or loss of vision return to be seen sooner.

## 2017-02-27 NOTE — ED Provider Notes (Signed)
[facility]    CSN: [id number] Arrival date & time: 02/27/17  1208     History   Chief Complaint Chief Complaint  Patient presents with  . Eye Problem    HPI Kylie KollerMillinda Leighanne Deans is a 26 y.o. female.   Shann MedalMillinda presents with complaints of left eye "floaters" and "fuzziness" which is intermittent for the past 4 days. She was treated for conjunctivitis 11/8 which has improved. She does wear glasses, has not seen her eye doctor in 3 years. Mild itching. Clear tearing without exudate. Without redness or swelling. She states she has been stressed recently with increased anxiety. She does have a history of migraines. Denies pain to eye. Mild congestion present.    ROS per HPI.       Past Medical History:  Diagnosis Date  . Medical history non-contributory   . Migraines     Patient Active Problem List   Diagnosis Date Noted  . Essential hypertension 12/03/2016  . Blurring of visual image 06/28/2016  . Stress 12/11/2015  . Panic attacks 12/11/2015  . Migraine with aura and without status migrainosus, not intractable 12/11/2015  . Obesity 12/11/2015  . Elevated BP without diagnosis of hypertension 12/11/2015    Past Surgical History:  Procedure Laterality Date  . TONSILLECTOMY      OB History    Gravida Para Term Preterm AB Living   1 1 1     1    SAB TAB Ectopic Multiple Live Births           1       Home Medications    Prior to Admission medications   Medication Sig Start Date End Date Taking? Authorizing Provider  albuterol (PROVENTIL HFA;VENTOLIN HFA) 108 (90 Base) MCG/ACT inhaler Inhale 2 puffs every 4 (four) hours as needed into the lungs for wheezing or shortness of breath. 02/10/17   Hayden RasmussenMabe, David, NP  fluticasone (FLONASE) 50 MCG/ACT nasal spray Place 1 spray into both nostrils 2 (two) times daily. 11/13/16   Elvina SidleLauenstein, Kurt, MD  olopatadine (PATANOL) 0.1 % ophthalmic solution Place 1 drop into the left eye daily for 14 days. 02/27/17  03/13/17  Georgetta HaberBurky, Isatu Macinnes B, NP  verapamil (CALAN-SR) 120 MG CR tablet Take 1 tablet (120 mg total) by mouth at bedtime. 12/03/16   Loletta SpecterGomez, Roger David, PA-C    Family History No family history on file.  Social History Social History   Tobacco Use  . Smoking status: Current Every Day Smoker    Packs/day: 0.50    Types: Cigarettes  . Smokeless tobacco: Never Used  Substance Use Topics  . Alcohol use: No    Comment: occ  . Drug use: No     Allergies   Patient has no known allergies.   Review of Systems Review of Systems   Physical Exam Triage Vital Signs ED Triage Vitals  Enc Vitals Group     BP 02/27/17 1244 135/82     Pulse Rate 02/27/17 1244 90     Resp 02/27/17 1244 16     Temp 02/27/17 1244 98.6 F (37 C)     Temp Source 02/27/17 1244 Oral     SpO2 02/27/17 1244 100 %     Weight --      Height --      Head Circumference --      Peak Flow --      Pain Score 02/27/17 1248 0     Pain Loc --  Pain Edu? --      Excl. in GC? --    No data found.  Updated Vital Signs BP 135/82 (BP Location: Left Arm)   Pulse 90   Temp 98.6 F (37 C) (Oral)   Resp 16   SpO2 100%   Visual Acuity Right Eye Distance: 20/25 Left Eye Distance: 20/70 Bilateral Distance: 20/30  Right Eye Near:   Left Eye Near:    Bilateral Near:     Physical Exam  Constitutional: She is oriented to person, place, and time. She appears well-developed and well-nourished. No distress.  HENT:  Head: Normocephalic and atraumatic.  Right Ear: Tympanic membrane, external ear and ear canal normal.  Left Ear: Tympanic membrane, external ear and ear canal normal.  Nose: Nose normal.  Mouth/Throat: Uvula is midline, oropharynx is clear and moist and mucous membranes are normal. No tonsillar exudate.  Eyes: Conjunctivae, EOM and lids are normal. Pupils are equal, round, and reactive to light. Right eye exhibits no discharge and no exudate. Left eye exhibits no discharge and no exudate. Left  conjunctiva is not injected. Left conjunctiva has no hemorrhage.  Cardiovascular: Normal rate, regular rhythm and normal heart sounds.  Pulmonary/Chest: Effort normal and breath sounds normal.  Neurological: She is alert and oriented to person, place, and time.  Skin: Skin is warm and dry.     UC Treatments / Results  Labs (all labs ordered are listed, but only abnormal results are displayed) Labs Reviewed - No data to display  EKG  EKG Interpretation None       Radiology No results found.  Procedures Procedures (including critical care time)  Medications Ordered in UC Medications - No data to display   Initial Impression / Assessment and Plan / UC Course  I have reviewed the triage vital signs and the nursing notes.  Pertinent labs & imaging results that were available during my care of the patient were reviewed by me and considered in my medical decision making (see chart for details).     Without pain, redness or any acute findings on physical exam. Stress/anxiety vs migraine vs allergic conjunctivitis. Will try olopatadine to left eye. Follow up this week for further exam with eye doctor. Return precautions provided.Patient verbalized understanding and agreeable to plan.    Final Clinical Impressions(s) / UC Diagnoses   Final diagnoses:  Excessive tear production of left lacrimal gland    ED Discharge Orders        Ordered    olopatadine (PATANOL) 0.1 % ophthalmic solution  Daily     02/27/17 1301       Controlled Substance Prescriptions [place] Controlled Substance Registry consulted? Not Applicable   Georgetta HaberBurky, Sharvi Mooneyhan B, NP 02/27/17 1306

## 2017-04-07 ENCOUNTER — Ambulatory Visit (INDEPENDENT_AMBULATORY_CARE_PROVIDER_SITE_OTHER): Payer: Medicaid Other | Admitting: Physician Assistant

## 2017-04-08 ENCOUNTER — Encounter (HOSPITAL_COMMUNITY): Payer: Self-pay | Admitting: Emergency Medicine

## 2017-04-08 ENCOUNTER — Ambulatory Visit (HOSPITAL_COMMUNITY)
Admission: EM | Admit: 2017-04-08 | Discharge: 2017-04-08 | Disposition: A | Discharge status: Home / Self Care | Payer: Medicaid Other | Attending: Family Medicine | Admitting: Family Medicine

## 2017-04-08 DIAGNOSIS — R1013 Epigastric pain: Secondary | ICD-10-CM

## 2017-04-08 DIAGNOSIS — K219 Gastro-esophageal reflux disease without esophagitis: Secondary | ICD-10-CM

## 2017-04-08 MED ORDER — OMEPRAZOLE 20 MG PO CPDR: 20.0000 mg | DELAYED_RELEASE_CAPSULE | Freq: Every day | ORAL | 0 refills | Status: DC

## 2017-04-08 NOTE — ED Provider Notes (Signed)
[facility]    CSN: [id number] Arrival date & time: 04/08/17  1242     History   Chief Complaint Chief Complaint  Patient presents with  . Chest Pain  . Abdominal Pain    HPI Kylie KollerMillinda Leighanne Bailey is a 27 y.o. female.   27 yo obese female here for midepigastric abdominal pain x 3 days. Pain feels like stabbing pain and is worse at night when she lies down. Feels like "food or something" stuck in midepigastric area. Pain does not seem to be associated with food. Patient has not tried any medication. Admits to associated nausea but denies vomiting or diarrhea.       Past Medical History:  Diagnosis Date  . Medical history non-contributory   . Migraines     Patient Active Problem List   Diagnosis Date Noted  . Essential hypertension 12/03/2016  . Blurring of visual image 06/28/2016  . Stress 12/11/2015  . Panic attacks 12/11/2015  . Migraine with aura and without status migrainosus, not intractable 12/11/2015  . Obesity 12/11/2015  . Elevated BP without diagnosis of hypertension 12/11/2015    Past Surgical History:  Procedure Laterality Date  . TONSILLECTOMY      OB History    Gravida Para Term Preterm AB Living   1 1 1     1    SAB TAB Ectopic Multiple Live Births           1       Home Medications    Prior to Admission medications   Medication Sig Start Date End Date Taking? Authorizing Provider  verapamil (CALAN-SR) 120 MG CR tablet Take 1 tablet (120 mg total) by mouth at bedtime. 12/03/16  Yes Loletta SpecterGomez, Roger David, PA-C  albuterol (PROVENTIL HFA;VENTOLIN HFA) 108 (90 Base) MCG/ACT inhaler Inhale 2 puffs every 4 (four) hours as needed into the lungs for wheezing or shortness of breath. 02/10/17   Hayden RasmussenMabe, David, NP  fluticasone (FLONASE) 50 MCG/ACT nasal spray Place 1 spray into both nostrils 2 (two) times daily. 11/13/16   Elvina SidleLauenstein, Kurt, MD  omeprazole (PRILOSEC) 20 MG capsule Take 1 capsule (20 mg total) by mouth daily for 14 days. 04/08/17  04/22/17  Rolm BookbinderMoss, Malyk Girouard, DO    Family History No family history on file.  Social History Social History   Tobacco Use  . Smoking status: Current Every Day Smoker    Packs/day: 0.50    Types: Cigarettes  . Smokeless tobacco: Never Used  Substance Use Topics  . Alcohol use: No    Comment: occ  . Drug use: No     Allergies   Patient has no known allergies.   Review of Systems Review of Systems  Constitutional: Negative for activity change and appetite change.  HENT: Negative for congestion and ear discharge.   Eyes: Negative for discharge and itching.  Respiratory: Negative for cough and shortness of breath.   Cardiovascular: Negative for chest pain and palpitations.  Gastrointestinal: Positive for abdominal pain and nausea. Negative for abdominal distention.  Endocrine: Negative for heat intolerance.  Genitourinary: Negative for difficulty urinating and dysuria.  Musculoskeletal: Negative for arthralgias and back pain.  Neurological: Negative for dizziness and light-headedness.  Hematological: Negative for adenopathy. Does not bruise/bleed easily.     Physical Exam Triage Vital Signs ED Triage Vitals [04/08/17 1349]  Enc Vitals Group     BP 117/79     Pulse Rate 81     Resp 16     Temp 98.4 F (  36.9 C)     Temp Source Oral     SpO2 100 %     Weight 290 lb (131.5 kg)     Height 5\' 9"  (1.753 m)     Head Circumference      Peak Flow      Pain Score 4     Pain Loc      Pain Edu?      Excl. in GC?    No data found.  Updated Vital Signs BP 117/79   Pulse 81   Temp 98.4 F (36.9 C) (Oral)   Resp 16   Ht 5\' 9"  (1.753 m)   Wt 290 lb (131.5 kg)   LMP 03/19/2017   SpO2 100%   BMI 42.83 kg/m   Visual Acuity Right Eye Distance:   Left Eye Distance:   Bilateral Distance:    Right Eye Near:   Left Eye Near:    Bilateral Near:     Physical Exam  Constitutional: She is oriented to person, place, and time. She appears well-developed and well-nourished.    HENT:  Head: Normocephalic and atraumatic.  Eyes: EOM are normal. Pupils are equal, round, and reactive to light.  Neck: Normal range of motion. Neck supple.  Cardiovascular: Regular rhythm and normal pulses.  Pulmonary/Chest: Effort normal and breath sounds normal. No respiratory distress.  Abdominal: Soft. She exhibits no distension. There is no rebound and no guarding.  midepigastric tenderness to palpation  Musculoskeletal: Normal range of motion.       Right lower leg: She exhibits no edema.       Left lower leg: She exhibits no edema.  Neurological: She is alert and oriented to person, place, and time.  Psychiatric: She has a normal mood and affect. Her behavior is normal.     UC Treatments / Results  Labs (all labs ordered are listed, but only abnormal results are displayed) Labs Reviewed - No data to display  EKG  EKG Interpretation None       Radiology No results found.  Procedures Procedures (including critical care time)  Medications Ordered in UC Medications - No data to display   Initial Impression / Assessment and Plan / UC Course  I have reviewed the triage vital signs and the nursing notes.  Pertinent labs & imaging results that were available during my care of the patient were reviewed by me and considered in my medical decision making (see chart for details).   Will treat GERD with short course of PPI. Patient should follow up with pcp to evaluate effectiveness of treatment, may need further work up with GI if no symptom improvement.  Final Clinical Impressions(s) / UC Diagnoses   Final diagnoses:  Gastroesophageal reflux disease, esophagitis presence not specified    ED Discharge Orders        Ordered    omeprazole (PRILOSEC) 20 MG capsule  Daily     04/08/17 1444       Controlled Substance Prescriptions  Controlled Substance Registry consulted? Not Applicable   Rolm Bookbinder, DO 04/08/17 1445

## 2017-04-08 NOTE — ED Triage Notes (Signed)
Pt reports intermittent chest pain for 2 weeks. PT reports pain has worsened below sternum. PT reports upper and lower abdominal pain as well.

## 2017-04-18 ENCOUNTER — Ambulatory Visit (INDEPENDENT_AMBULATORY_CARE_PROVIDER_SITE_OTHER): Payer: Medicaid Other | Admitting: Physician Assistant

## 2017-04-22 ENCOUNTER — Other Ambulatory Visit: Payer: Self-pay

## 2017-04-22 ENCOUNTER — Encounter (INDEPENDENT_AMBULATORY_CARE_PROVIDER_SITE_OTHER): Payer: Self-pay | Admitting: Physician Assistant

## 2017-04-22 ENCOUNTER — Ambulatory Visit (INDEPENDENT_AMBULATORY_CARE_PROVIDER_SITE_OTHER): Payer: Medicaid Other | Admitting: Physician Assistant

## 2017-04-22 VITALS — BP 124/83 | HR 75 | Temp 98.1°F | Wt 303.8 lb

## 2017-04-22 DIAGNOSIS — N926 Irregular menstruation, unspecified: Secondary | ICD-10-CM

## 2017-04-22 DIAGNOSIS — Z3202 Encounter for pregnancy test, result negative: Secondary | ICD-10-CM | POA: Diagnosis not present

## 2017-04-22 DIAGNOSIS — Z309 Encounter for contraceptive management, unspecified: Secondary | ICD-10-CM | POA: Diagnosis not present

## 2017-04-22 LAB — POCT URINE PREGNANCY: PREG TEST UR: NEGATIVE

## 2017-04-22 NOTE — Progress Notes (Signed)
   Subjective:  Patient ID: Kylie Bailey, female    DOB: 01/04/1991  Age: 27 y.o. MRN: [id number]  CC: pregnancy test, depression and anxiety  HPI  Kylie Bailey is a 27 y.o. female with a medical history of anxiety, depression, migraine headaches, and obesity presents with concern for pregnancy and to f/u on depression with anxiety. Last assessed for anxiety on 09/01/2016. She was advised to increase sertraline to 100 mg. Sertraline has run out months ago. Patient is irritable. [facility] Has been prescribed medication but she was unable to afford. Has psychotherapy on 05/11/17.    Pt requests urine pregnancy test. Says her husband is pressuring her to have a pregnancy test done because she is not on birth control. Husband does not want a child. Pt also does not want a child, "not with the way things are going", refers to relationship trouble and financial troubles. Patient is considering either the Nexplanon or IUD.   Outpatient Medications Prior to Visit  Medication Sig Dispense Refill  . albuterol (PROVENTIL HFA;VENTOLIN HFA) 108 (90 Base) MCG/ACT inhaler Inhale 2 puffs every 4 (four) hours as needed into the lungs for wheezing or shortness of breath. 1 Inhaler 0  . fluticasone (FLONASE) 50 MCG/ACT nasal spray Place 1 spray into both nostrils 2 (two) times daily. 15.8 g 2  . verapamil (CALAN-SR) 120 MG CR tablet Take 1 tablet (120 mg total) by mouth at bedtime. 30 tablet 11  . omeprazole (PRILOSEC) 20 MG capsule Take 1 capsule (20 mg total) by mouth daily for 14 days. (Patient not taking: Reported on 04/22/2017) 14 capsule 0   No facility-administered medications prior to visit.      ROS Review of Systems  Constitutional: Negative for chills, fever and malaise/fatigue.  Eyes: Negative for blurred vision.  Respiratory: Negative for shortness of breath.   Cardiovascular: Negative for chest pain and palpitations.  Gastrointestinal: Negative for abdominal  pain and nausea.  Genitourinary: Negative for dysuria and hematuria.  Musculoskeletal: Negative for joint pain and myalgias.  Skin: Negative for rash.  Neurological: Negative for tingling and headaches.  Psychiatric/Behavioral: Negative for depression. The patient is not nervous/anxious.     Objective:  BP 124/83 (BP Location: Left Arm, Patient Position: Sitting, Cuff Size: Large)   Pulse 75   Temp 98.1 F (36.7 C) (Oral)   Wt (!) 303 lb 12.8 oz (137.8 kg)   LMP 03/21/2017 (Approximate)   SpO2 96%   BMI 44.86 kg/m   BP/Weight 04/22/2017 04/08/2017 02/27/2017  Systolic BP 124 117 135  Diastolic BP 83 79 82  Wt. (Lbs) 303.8 290 -  BMI 44.86 42.83 -      Physical Exam  Constitutional: She is oriented to person, place, and time.  Well developed, obese, NAD, polite  HENT:  Head: Normocephalic and atraumatic.  Neck: Normal range of motion.  Pulmonary/Chest: Effort normal.  Neurological: She is alert and oriented to person, place, and time.  Skin: Skin is dry. No rash noted. No erythema. No pallor.  Psychiatric: She has a normal mood and affect. Her behavior is normal. Thought content normal.  Vitals reviewed.    Assessment & Plan:   1. Late period - POCT urine pregnancy negative in clinic today.  2. Encounter for contraceptive management, unspecified type - Patient's questions answered. I have asked for her to call     Follow-up: PRN   Loletta Specteroger David Gomez PA

## 2017-04-22 NOTE — Patient Instructions (Addendum)
Please call [phone] to Make an appointment with the [facility].   Intrauterine Device Information An intrauterine device (IUD) is inserted into your uterus to prevent pregnancy. There are two types of IUDs available:  Copper IUD-This type of IUD is wrapped in copper wire and is placed inside the uterus. Copper makes the uterus and fallopian tubes produce a fluid that kills sperm. The copper IUD can stay in place for 10 years.  Hormone IUD-This type of IUD contains the hormone progestin (synthetic progesterone). The hormone thickens the cervical mucus and prevents sperm from entering the uterus. It also thins the uterine lining to prevent implantation of a fertilized egg. The hormone can weaken or kill the sperm that get into the uterus. One type of hormone IUD can stay in place for 5 years, and another type can stay in place for 3 years.  Your health care provider will make sure you are a good candidate for a contraceptive IUD. Discuss with your health care provider the possible side effects. Advantages of an intrauterine device  IUDs are highly effective, reversible, long acting, and low maintenance.  There are no estrogen-related side effects.  An IUD can be used when breastfeeding.  IUDs are not associated with weight gain.  The copper IUD works immediately after insertion.  The hormone IUD works right away if inserted within 7 days of your period starting. You will need to use a backup method of birth control for 7 days if the hormone IUD is inserted at any other time in your cycle.  The copper IUD does not interfere with your female hormones.  The hormone IUD can make heavy menstrual periods lighter and decrease cramping.  The hormone IUD can be used for 3 or 5 years.  The copper IUD can be used for 10 years. Disadvantages of an intrauterine device  The hormone IUD can be associated with irregular bleeding patterns.  The copper IUD can  make your menstrual flow heavier and more painful.  You may experience cramping and vaginal bleeding after insertion. This information is not intended to replace advice given to you by your health care provider. Make sure you discuss any questions you have with your health care provider. Document Released: 02/24/2004 Document Revised: 08/28/2015 Document Reviewed: 09/10/2012 Elsevier Interactive Patient Education  2017 [employer]

## 2017-06-24 ENCOUNTER — Other Ambulatory Visit: Payer: Self-pay

## 2017-06-24 ENCOUNTER — Encounter (INDEPENDENT_AMBULATORY_CARE_PROVIDER_SITE_OTHER): Payer: Self-pay | Admitting: Physician Assistant

## 2017-06-24 ENCOUNTER — Ambulatory Visit (INDEPENDENT_AMBULATORY_CARE_PROVIDER_SITE_OTHER): Payer: Medicaid Other | Admitting: Physician Assistant

## 2017-06-24 VITALS — BP 128/82 | HR 77 | Temp 98.0°F | Wt 298.8 lb

## 2017-06-24 DIAGNOSIS — J302 Other seasonal allergic rhinitis: Secondary | ICD-10-CM

## 2017-06-24 DIAGNOSIS — J0100 Acute maxillary sinusitis, unspecified: Secondary | ICD-10-CM | POA: Diagnosis not present

## 2017-06-24 DIAGNOSIS — J209 Acute bronchitis, unspecified: Secondary | ICD-10-CM | POA: Diagnosis not present

## 2017-06-24 MED ORDER — NAPROXEN 500 MG PO TABS: 500.0000 mg | ORAL_TABLET | Freq: Two times a day (BID) | ORAL | 0 refills | Status: DC

## 2017-06-24 MED ORDER — LEVOCETIRIZINE DIHYDROCHLORIDE 5 MG PO TABS: 5.0000 mg | ORAL_TABLET | Freq: Every evening | ORAL | 0 refills | Status: DC

## 2017-06-24 MED ORDER — FLUTICASONE PROPIONATE 50 MCG/ACT NA SUSP: 1.0000 | Freq: Two times a day (BID) | NASAL | 2 refills | Status: DC

## 2017-06-24 MED ORDER — GUAIFENESIN ER 1200 MG PO TB12: 1.0000 | ORAL_TABLET | Freq: Two times a day (BID) | ORAL | 0 refills | Status: AC

## 2017-06-24 MED ORDER — ALBUTEROL SULFATE HFA 108 (90 BASE) MCG/ACT IN AERS: 2.0000 | INHALATION_SPRAY | RESPIRATORY_TRACT | 0 refills | Status: DC | PRN

## 2017-06-24 MED ORDER — AZITHROMYCIN 250 MG PO TABS: ORAL_TABLET | ORAL | 0 refills | Status: DC

## 2017-06-24 NOTE — Patient Instructions (Signed)

## 2017-06-24 NOTE — Progress Notes (Signed)
Subjective:  Patient ID: Kylie Bailey, female    DOB: [birth year]  Age: 27 y.o. MRN: [id number]  CC: URI, wheezing  HPI  Kylie Wideneris a 26 y.o.femalewith a medical history of anxiety, depression, migraine headaches, and obesity presents with two week hx of cough, nasal congestion, malaise, ear congestion, and sinus pressure. Has taken Guaifenesin with minimal relief. Believes cough has decreased but sinus pressure and ear congestion persist. No other symptoms or complaints. Would like a recommendation for an allergy medicine due to the upcoming pollen season.      Outpatient Medications Prior to Visit  Medication Sig Dispense Refill  . verapamil (CALAN-SR) 120 MG CR tablet Take 1 tablet (120 mg total) by mouth at bedtime. 30 tablet 11  . albuterol (PROVENTIL HFA;VENTOLIN HFA) 108 (90 Base) MCG/ACT inhaler Inhale 2 puffs every 4 (four) hours as needed into the lungs for wheezing or shortness of breath. (Patient not taking: Reported on 06/24/2017) 1 Inhaler 0  . fluticasone (FLONASE) 50 MCG/ACT nasal spray Place 1 spray into both nostrils 2 (two) times daily. (Patient not taking: Reported on 06/24/2017) 15.8 g 2   No facility-administered medications prior to visit.      ROS Review of Systems  Constitutional: Positive for malaise/fatigue. Negative for chills and fever.  HENT: Positive for congestion, ear pain and sinus pain.   Eyes: Negative for blurred vision and pain.  Respiratory: Positive for cough. Negative for shortness of breath.   Cardiovascular: Negative for chest pain and palpitations.  Gastrointestinal: Negative for abdominal pain and nausea.  Genitourinary: Negative for dysuria and hematuria.  Musculoskeletal: Negative for joint pain and myalgias.  Skin: Negative for rash.  Neurological: Negative for tingling and headaches.  Psychiatric/Behavioral: Negative for depression. The patient is not nervous/anxious.     Objective:  BP 128/82 (BP  Location: Right Arm, Patient Position: Sitting, Cuff Size: Large)   Pulse 77   Temp 98 F (36.7 C) (Oral)   Wt 298 lb 12.8 oz (135.5 kg)   LMP 05/15/2017 (Exact Date)   SpO2 96%   BMI 44.13 kg/m   BP/Weight 06/24/2017 04/22/2017 04/08/2017  Systolic BP 128 124 117  Diastolic BP 82 83 79  Wt. (Lbs) 298.8 303.8 290  BMI 44.13 44.86 42.83      Physical Exam  Constitutional: She is oriented to person, place, and time.  Well developed, well nourished, NAD, polite  HENT:  Head: Normocephalic and atraumatic.  Mouth/Throat: No oropharyngeal exudate.  Turbinates erythematous and mildly hypertrophic. TMs with air fluid level but no erythema or bulging. Mild postnasal drip.   Eyes: No scleral icterus.  Neck: Normal range of motion. Neck supple. No thyromegaly present.  Cardiovascular: Normal rate, regular rhythm and normal heart sounds.  Pulmonary/Chest: Effort normal and breath sounds normal.  Musculoskeletal: She exhibits no edema.  Lymphadenopathy:    She has no cervical adenopathy.  Neurological: She is alert and oriented to person, place, and time. No cranial nerve deficit. Coordination normal.  Skin: Skin is warm and dry. No rash noted. No erythema. No pallor.  Psychiatric: She has a normal mood and affect. Her behavior is normal. Thought content normal.  Vitals reviewed.    Assessment & Plan:    1. Acute non-recurrent maxillary sinusitis - azithromycin (ZITHROMAX) 250 MG tablet; Take two tablets today then one tablet daily thereafter  Dispense: 6 tablet; Refill: 0 - naproxen (NAPROSYN) 500 MG tablet; Take 1 tablet (500 mg total) by mouth 2 (two) times daily with a  meal.  Dispense: 30 tablet; Refill: 0 - Guaifenesin (MUCINEX MAXIMUM STRENGTH) 1200 MG TB12; Take 1 tablet (1,200 mg total) by mouth 2 (two) times daily for 5 days.  Dispense: 10 tablet; Refill: 0  2. Acute bronchitis, unspecified organism - albuterol (PROVENTIL HFA;VENTOLIN HFA) 108 (90 Base) MCG/ACT inhaler;  Inhale 2 puffs into the lungs every 4 (four) hours as needed for wheezing or shortness of breath.  Dispense: 1 Inhaler; Refill: 0 - naproxen (NAPROSYN) 500 MG tablet; Take 1 tablet (500 mg total) by mouth 2 (two) times daily with a meal.  Dispense: 30 tablet; Refill: 0 - Guaifenesin (MUCINEX MAXIMUM STRENGTH) 1200 MG TB12; Take 1 tablet (1,200 mg total) by mouth 2 (two) times daily for 5 days.  Dispense: 10 tablet; Refill: 0  3. Seasonal allergies - levocetirizine (XYZAL) 5 MG tablet; Take 1 tablet (5 mg total) by mouth every evening.  Dispense: 30 tablet; Refill: 0 - fluticasone (FLONASE) 50 MCG/ACT nasal spray; Place 1 spray into both nostrils 2 (two) times daily.  Dispense: 15.8 g; Refill: 2   Meds ordered this encounter  Medications  . azithromycin (ZITHROMAX) 250 MG tablet    Sig: Take two tablets today then one tablet daily thereafter    Dispense:  6 tablet    Refill:  0    Order Specific Question:   Supervising Provider    Answer:   Quentin AngstJEGEDE, OLUGBEMIGA E [id number]]  . albuterol (PROVENTIL HFA;VENTOLIN HFA) 108 (90 Base) MCG/ACT inhaler    Sig: Inhale 2 puffs into the lungs every 4 (four) hours as needed for wheezing or shortness of breath.    Dispense:  1 Inhaler    Refill:  0    Order Specific Question:   Supervising Provider    Answer:   Quentin AngstJEGEDE, OLUGBEMIGA E [id number]]  . naproxen (NAPROSYN) 500 MG tablet    Sig: Take 1 tablet (500 mg total) by mouth 2 (two) times daily with a meal.    Dispense:  30 tablet    Refill:  0    Order Specific Question:   Supervising Provider    Answer:   Quentin AngstJEGEDE, OLUGBEMIGA E [id number]]  . levocetirizine (XYZAL) 5 MG tablet    Sig: Take 1 tablet (5 mg total) by mouth every evening.    Dispense:  30 tablet    Refill:  0    Order Specific Question:   Supervising Provider    Answer:   Quentin AngstJEGEDE, OLUGBEMIGA E [id number]]  . fluticasone (FLONASE) 50 MCG/ACT nasal spray    Sig: Place 1 spray into both nostrils 2 (two) times daily.    Dispense:  15.8 g     Refill:  2    Order Specific Question:   Supervising Provider    Answer:   Quentin AngstJEGEDE, OLUGBEMIGA E [id number]]  . Guaifenesin (MUCINEX MAXIMUM STRENGTH) 1200 MG TB12    Sig: Take 1 tablet (1,200 mg total) by mouth 2 (two) times daily for 5 days.    Dispense:  10 tablet    Refill:  0    Order Specific Question:   Supervising Provider    Answer:   Quentin AngstJEGEDE, OLUGBEMIGA E [id number]]    Follow-up: No follow-ups on file.   Loletta Specteroger David Gomez PA

## 2017-07-28 ENCOUNTER — Ambulatory Visit: Payer: Medicaid Other | Admitting: Family Medicine

## 2017-08-07 ENCOUNTER — Other Ambulatory Visit: Payer: Self-pay

## 2017-08-07 ENCOUNTER — Encounter (HOSPITAL_COMMUNITY): Payer: Self-pay | Admitting: Emergency Medicine

## 2017-08-07 ENCOUNTER — Ambulatory Visit (HOSPITAL_COMMUNITY)
Admission: EM | Admit: 2017-08-07 | Discharge: 2017-08-07 | Disposition: A | Discharge status: Home / Self Care | Payer: Medicaid Other

## 2017-08-07 DIAGNOSIS — K0889 Other specified disorders of teeth and supporting structures: Secondary | ICD-10-CM

## 2017-08-07 MED ORDER — AMOXICILLIN-POT CLAVULANATE 875-125 MG PO TABS: 1.0000 | ORAL_TABLET | Freq: Two times a day (BID) | ORAL | 0 refills | Status: AC

## 2017-08-07 MED ORDER — IBUPROFEN 800 MG PO TABS: 800.0000 mg | ORAL_TABLET | Freq: Three times a day (TID) | ORAL | 0 refills | Status: DC

## 2017-08-07 MED ORDER — HYDROCODONE-ACETAMINOPHEN 5-325 MG PO TABS: 1.0000 | ORAL_TABLET | Freq: Four times a day (QID) | ORAL | 0 refills | Status: DC | PRN

## 2017-08-07 NOTE — Discharge Instructions (Signed)
Follow-up with dentistry for permenant treatment/relief.   Today we have given you an antibiotic-Augmentin. This should help with pain as any infection is cleared.   For pain please take -  of Ibuprofen every 8 hours, take with 1000 mg of Tylenol Extra strength every 8 hours. These are safe to take together. Please take with food.   I have also provided 3 days worth of stronger pain medication. This should only be used for severe pain. Do not drive or operate machinery while taking this medication.   Please return if you start to experience significant swelling of your face, experiencing fever.

## 2017-08-07 NOTE — ED Triage Notes (Signed)
Tooth pain that started one week ago.  Patient has an appt with dentist this Thursday.  Tooth is the bottom, left.    Throbbing pain in left ear, neck, upper chest.

## 2017-08-07 NOTE — ED Provider Notes (Signed)
[facility]    CSN: [id number] Arrival date & time: 08/07/17  1200     History   Chief Complaint Chief Complaint  Patient presents with  . Dental Pain    HPI Kylie Bailey is a 27 y.o. female presenting today for evaluation of dental pain.  Patient states that for the past week she has had worsening dental pain in her left lower jaw at her back molars.  Patient has a dentist appointment this upcoming Friday on May 9.  She feels like she may have a cavity.  Pain worsens anytime she eats.  Pain is radiating to her ear and into her neck.  Has been trying Orajel and Goody powders without relief.  HPI  Past Medical History:  Diagnosis Date  . Medical history non-contributory   . Migraines     Patient Active Problem List   Diagnosis Date Noted  . Essential hypertension 12/03/2016  . Blurring of visual image 06/28/2016  . Stress 12/11/2015  . Panic attacks 12/11/2015  . Migraine with aura and without status migrainosus, not intractable 12/11/2015  . Obesity 12/11/2015  . Elevated BP without diagnosis of hypertension 12/11/2015    Past Surgical History:  Procedure Laterality Date  . TONSILLECTOMY      OB History    Gravida  1   Para  1   Term  1   Preterm      AB      Living  1     SAB      TAB      Ectopic      Multiple      Live Births  1            Home Medications    Prior to Admission medications   Medication Sig Start Date End Date Taking? Authorizing Provider  verapamil (CALAN-SR) 120 MG CR tablet Take 1 tablet (120 mg total) by mouth at bedtime. 12/03/16  Yes Loletta Specter, PA-C  albuterol (PROVENTIL HFA;VENTOLIN HFA) 108 [phone] Base) MCG/ACT inhaler Inhale 2 puffs into the lungs every 4 (four) hours as needed for wheezing or shortness of breath. 06/24/17   Loletta Specter, PA-C  amoxicillin-clavulanate (AUGMENTIN) 875-125 MG tablet Take 1 tablet by mouth every 12 (twelve) hours for 7 days. 08/07/17 08/14/17   Alexie Lanni C, PA-C  fluticasone (FLONASE) 50 MCG/ACT nasal spray Place 1 spray into both nostrils 2 (two) times daily. 06/24/17   Loletta Specter, PA-C  HYDROcodone-acetaminophen (NORCO/VICODIN) 5-325 MG tablet Take 1 tablet by mouth every 6 (six) hours as needed for up to 3 days for severe pain. 08/07/17 08/10/17  Aradhana Gin C, PA-C  ibuprofen (ADVIL,MOTRIN) 800 MG tablet Take 1 tablet (800 mg total) by mouth 3 (three) times daily. 08/07/17   Rashanda Magloire C, PA-C  levocetirizine (XYZAL) 5 MG tablet Take 1 tablet (5 mg total) by mouth every evening. 06/24/17   Loletta Specter, PA-C  naproxen (NAPROSYN) 500 MG tablet Take 1 tablet (500 mg total) by mouth 2 (two) times daily with a meal. 06/24/17   Loletta Specter, PA-C    Family History Family History  Problem Relation Age of Onset  . Hypertension Mother   . Heart attack Mother     Social History Social History   Tobacco Use  . Smoking status: Current Every Day Smoker    Packs/day: 0.50    Types: Cigarettes  . Smokeless tobacco: Never Used  Substance Use Topics  . Alcohol  use: No    Comment: occ  . Drug use: No     Allergies   Patient has no known allergies.   Review of Systems Review of Systems  Constitutional: Negative for fatigue and fever.  HENT: Positive for dental problem and ear pain. Negative for facial swelling and sore throat.   Respiratory: Negative for cough and shortness of breath.   Cardiovascular: Negative for chest pain.  Gastrointestinal: Negative for abdominal pain, nausea and vomiting.  Musculoskeletal: Positive for neck pain. Negative for neck stiffness.  Skin: Negative for rash.  Neurological: Positive for headaches. Negative for dizziness, weakness, light-headedness and numbness.     Physical Exam Triage Vital Signs ED Triage Vitals  Enc Vitals Group     BP 08/07/17 1243 (!) 141/84     Pulse Rate 08/07/17 1243 81     Resp 08/07/17 1243 (!) 22     Temp 08/07/17 1243 97.9 F (36.6  C)     Temp Source 08/07/17 1243 Oral     SpO2 08/07/17 1243 100 %     Weight --      Height --      Head Circumference --      Peak Flow --      Pain Score 08/07/17 1240 7     Pain Loc --      Pain Edu? --      Excl. in GC? --    No data found.  Updated Vital Signs BP (!) 141/84 (BP Location: Left Arm)   Pulse 81   Temp 97.9 F (36.6 C) (Oral)   Resp (!) 22   LMP 08/01/2017   SpO2 100%   Visual Acuity Right Eye Distance:   Left Eye Distance:   Bilateral Distance:    Right Eye Near:   Left Eye Near:    Bilateral Near:     Physical Exam  Constitutional: She appears well-developed and well-nourished. No distress.  HENT:  Head: Normocephalic and atraumatic.  Mouth/Throat:    2 posterior molars of left lower jaw with mild tenderness to palpation of surrounding gingiva, no obvious fracture or decay  Left lower jaw appears slightly more swollen compared to right  Eyes: Conjunctivae are normal.  Neck: Neck supple.  Cardiovascular: Normal rate and regular rhythm.  No murmur heard. Pulmonary/Chest: Effort normal and breath sounds normal. No respiratory distress.  Abdominal: Soft. There is no tenderness.  Musculoskeletal: She exhibits no edema.  Neurological: She is alert.  Skin: Skin is warm and dry.  Psychiatric: She has a normal mood and affect.  Nursing note and vitals reviewed.    UC Treatments / Results  Labs (all labs ordered are listed, but only abnormal results are displayed) Labs Reviewed - No data to display  EKG None  Radiology No results found.  Procedures Procedures (including critical care time)  Medications Ordered in UC Medications - No data to display  Initial Impression / Assessment and Plan / UC Course  I have reviewed the triage vital signs and the nursing notes.  Pertinent labs & imaging results that were available during my care of the patient were reviewed by me and considered in my medical decision making (see chart for  details).     Patient with dental pain, will go ahead and initiate Augmentin for treatment of possible infection although no clear abscess visualized on exam.  Will provide hydrocodone to use only for severe pain/nighttime.  Discussed sedation regarding this.  Advised mild to moderate pain to  use ibuprofen 800/Tylenol.  Follow-up with dentistry on Thursday. Discussed strict return precautions. Patient verbalized understanding and is agreeable with plan.  Final Clinical Impressions(s) / UC Diagnoses   Final diagnoses:  Pain, dental     Discharge Instructions     Follow-up with dentistry for permenant treatment/relief.   Today we have given you an antibiotic-Augmentin. This should help with pain as any infection is cleared.   For pain please take -  of Ibuprofen every 8 hours, take with 1000 mg of Tylenol Extra strength every 8 hours. These are safe to take together. Please take with food.   I have also provided 3 days worth of stronger pain medication. This should only be used for severe pain. Do not drive or operate machinery while taking this medication.   Please return if you start to experience significant swelling of your face, experiencing fever.    ED Prescriptions    Medication Sig Dispense Auth. Provider   amoxicillin-clavulanate (AUGMENTIN) 875-125 MG tablet Take 1 tablet by mouth every 12 (twelve) hours for 7 days. 14 tablet Daryana Whirley C, PA-C   HYDROcodone-acetaminophen (NORCO/VICODIN) 5-325 MG tablet Take 1 tablet by mouth every 6 (six) hours as needed for up to 3 days for severe pain. 12 tablet Kharter Sestak C, PA-C   ibuprofen (ADVIL,MOTRIN) 800 MG tablet Take 1 tablet (800 mg total) by mouth 3 (three) times daily. 30 tablet Guynell Kleiber, [place] C, PA-C     Controlled Substance Prescriptions [place] Controlled Substance Registry consulted? Yes, I have consulted the [place] Controlled Substances Registry for this patient, and feel the risk/benefit ratio today is  favorable for proceeding with this prescription for a controlled substance.   Agnes Brightbill, [place] C, PA-C 08/07/17 1344

## 2017-08-09 ENCOUNTER — Ambulatory Visit (INDEPENDENT_AMBULATORY_CARE_PROVIDER_SITE_OTHER): Payer: Medicaid Other | Admitting: Family Medicine

## 2017-08-09 ENCOUNTER — Other Ambulatory Visit: Payer: Self-pay

## 2017-08-09 ENCOUNTER — Encounter: Payer: Self-pay | Admitting: Family Medicine

## 2017-08-09 VITALS — BP 126/74 | HR 99 | Temp 98.8°F | Ht 69.0 in | Wt 305.0 lb

## 2017-08-09 DIAGNOSIS — J302 Other seasonal allergic rhinitis: Secondary | ICD-10-CM | POA: Diagnosis not present

## 2017-08-09 DIAGNOSIS — E669 Obesity, unspecified: Secondary | ICD-10-CM | POA: Diagnosis not present

## 2017-08-09 DIAGNOSIS — I10 Essential (primary) hypertension: Secondary | ICD-10-CM

## 2017-08-09 MED ORDER — LORATADINE 10 MG PO TABS: 10.0000 mg | ORAL_TABLET | Freq: Every day | ORAL | 11 refills | Status: DC

## 2017-08-09 NOTE — Patient Instructions (Addendum)
Thank you for coming to see me today. It was a pleasure! Today we talked about:   Please come in for lab to have your lipid panel drawn. We will call you with your results.  I have sent Claritin for your allergies, continue taking your Flonase.   Please schedule a follow up with me to discuss your anxiety. Please let the pharmacy know if you need any further refills.   Please call Dr. Gerilyn Pilgrim about your nutrition visit.   1-800-QUIT-NOW is a Chief Technology Officer for smoking cessation.   If you have any questions or concerns, please do not hesitate to call the office at [phone].  Take Care,   [place] Chinyere Galiano, DO

## 2017-08-09 NOTE — Progress Notes (Signed)
Subjective:    Patient ID: Kylie Bailey, female    DOB: [birth year], 27 y.o.   MRN: [id number]   CC: establish care  HPI:  Health maintenance: - Patient reports that many family members have high cholesterol at a young age and her on medication for this.  She would like to check her lipid panel today. - Patient is also interested in weight loss and speaking with a nutritionist regarding this.  Reports that she has tried the keto diet to lose weight and is interested in starting soon again to try to lose weight. She reports she would like to try the keto diet again as she lost weight with it, but reports she gained much of this back.   Allergies: - Patient with allergies during this spring. ROS: runny nose and watery eyes  Patient reports no  vision/ hearing changes,anorexia, weight change, fever ,adenopathy, persistant / recurrent hoarseness, swallowing issues, edema,persistant / recurrent cough, hemoptysis, dyspnea(rest, exertional, paroxysmal nocturnal), gastrointestinal  bleeding (melena, rectal bleeding), abdominal pain, excessive heart burn, GU symptoms(dysuria, hematuria, pyuria, voiding/incontinence  Issues) syncope, focal weakness, severe memory loss, concerning skin lesions, depression, abnormal bruising/bleeding, major joint swelling, breast masses or abnormal vaginal bleeding.    Reports chest pain, anxiety and stress: chest pain not concerning to patient when questioned, will address this at follow up appointment when we can adequately discuss. Given strict return precautions for concerning chest pain.   Patient Active Problem List   Diagnosis Date Noted  . Seasonal allergies 08/18/2017  . Essential hypertension 12/03/2016  . Blurring of visual image 06/28/2016  . Stress 12/11/2015  . Panic attacks 12/11/2015  . Migraine with aura and without status migrainosus, not intractable 12/11/2015  . Obesity 12/11/2015  . Elevated BP without diagnosis of hypertension  12/11/2015     Family History  Problem Relation Age of Onset  . Hypertension Mother   . Heart attack Mother   . Drug abuse Father   . High Cholesterol Brother   . Diabetes Paternal Grandmother   . High blood pressure Paternal Grandmother   . Heart disease Paternal Grandmother   . Heart disease Maternal Uncle     Past Medical History:  Diagnosis Date  . GERD (gastroesophageal reflux disease)   . Medical history non-contributory   . Migraines     Social Hx: Reports tobacco use and is interested in quitting. Given QUIT NOW resource, alcohol use (socially), and denies illicit drug use.  Objective:  BP 126/74   Pulse 99   Temp 98.8 F (37.1 C) (Oral)   Ht  (1.753 m)   Wt (!) 305 lb (138.3 kg)   LMP 08/01/2017   SpO2 96%   BMI 45.04 kg/m  Vitals and nursing note reviewed  General: NAD, pleasant Head: Atraumatic Neck: Supple Cardiac: RRR, normal heart sounds, no murmurs Respiratory: CTAB, normal effort Abdomen: soft, nontender, nondistended Extremities: no edema or cyanosis. WWP. MSK: normal gait Skin: warm and dry, no rashes noted Neuro: alert and oriented, no focal deficits Psych: Neatly groomed and appropriately dressed. Maintains good eye contact and is cooperative and attentive. Speech is normal volume and rate. Denies SI/ HI. Normal affect.  Assessment & Plan:    Seasonal allergies Patient to try Claritin daily and Flonase 2 puffs per nostril twice daily for seasonal allergies.  Patient previously on allergen shots. Will refer to allergist for possible allergen injections.   Essential hypertension Patient on verapamil and BP 126/74 today. Will continue to monitor.  Obesity Patient to schedule an appointment with Dr. Gerilyn Pilgrim. Given contact information. Will test lipid panel today, which shows: Lipid Panel     Component Value Date/Time   CHOL 188 08/10/2017 1217   TRIG 93 08/10/2017 1217   HDL 36 (L) 08/10/2017 1217   CHOLHDL 5.2 (H) 08/10/2017 1217    LDLCALC 133 (H) 08/10/2017 1217  ACSVD risk is 6.2%. It is reasonable to offer statin, but with patient attempting diet changes, will work on this first and continue to monitor for risk factors. If patient desires medication at next appointment, will discuss.    [place] Brent Taillon, DO Family Medicine Resident, PGY-1

## 2017-08-10 ENCOUNTER — Other Ambulatory Visit: Payer: Medicaid Other

## 2017-08-11 ENCOUNTER — Encounter: Payer: Self-pay | Admitting: Family Medicine

## 2017-08-11 DIAGNOSIS — H16223 Keratoconjunctivitis sicca, not specified as Sjogren's, bilateral: Secondary | ICD-10-CM | POA: Diagnosis not present

## 2017-08-11 DIAGNOSIS — H40033 Anatomical narrow angle, bilateral: Secondary | ICD-10-CM | POA: Diagnosis not present

## 2017-08-11 LAB — BASIC METABOLIC PANEL
BUN / CREAT RATIO: 12 (ref 9–23)
BUN: 12 mg/dL (ref 6–20)
CHLORIDE: 104 mmol/L (ref 96–106)
CO2: 23 mmol/L (ref 20–29)
Calcium: 9.5 mg/dL (ref 8.7–10.2)
Creatinine, Ser: 0.97 mg/dL (ref 0.57–1.00)
GFR calc Af Amer: 94 mL/min/{1.73_m2} (ref >59)
GFR calc non Af Amer: 81 mL/min/{1.73_m2} (ref >59)
GLUCOSE: 87 mg/dL (ref 65–99)
POTASSIUM: 4.6 mmol/L (ref 3.5–5.2)
Sodium: 140 mmol/L (ref 134–144)

## 2017-08-11 LAB — LIPID PANEL
CHOL/HDL RATIO: 5.2 ratio — AB (ref 0.0–4.4)
Cholesterol, Total: 188 mg/dL (ref 100–199)
HDL: 36 mg/dL — ABNORMAL LOW (ref >39)
LDL CALC: 133 mg/dL — AB (ref 0–99)
TRIGLYCERIDES: 93 mg/dL (ref 0–149)
VLDL Cholesterol Cal: 19 mg/dL (ref 5–40)

## 2017-08-15 ENCOUNTER — Encounter: Payer: Self-pay | Admitting: Family Medicine

## 2017-08-15 ENCOUNTER — Other Ambulatory Visit: Payer: Self-pay

## 2017-08-15 DIAGNOSIS — I10 Essential (primary) hypertension: Secondary | ICD-10-CM

## 2017-08-15 MED ORDER — VERAPAMIL HCL ER 120 MG PO TBCR: 120.0000 mg | EXTENDED_RELEASE_TABLET | Freq: Every day | ORAL | 11 refills | Status: DC

## 2017-08-18 DIAGNOSIS — J302 Other seasonal allergic rhinitis: Secondary | ICD-10-CM | POA: Insufficient documentation

## 2017-08-18 NOTE — Assessment & Plan Note (Signed)
Patient to schedule an appointment with Dr. Gerilyn Pilgrim. Given contact information. Will test lipid panel today, which shows: Lipid Panel     Component Value Date/Time   CHOL 188 08/10/2017 1217   TRIG 93 08/10/2017 1217   HDL 36 (L) 08/10/2017 1217   CHOLHDL 5.2 (H) 08/10/2017 1217   LDLCALC 133 (H) 08/10/2017 1217  ACSVD risk is 6.2%. It is reasonable to offer statin, but with patient attempting diet changes, will work on this first and continue to monitor for risk factors. If patient desires medication at next appointment, will discuss.

## 2017-08-18 NOTE — Assessment & Plan Note (Addendum)
Patient to try Claritin daily and Flonase 2 puffs per nostril twice daily for seasonal allergies.  Patient previously on allergen shots. Will refer to allergist for possible allergen injections.

## 2017-08-18 NOTE — Assessment & Plan Note (Signed)
Patient on verapamil and BP 126/74 today. Will continue to monitor.

## 2017-09-19 ENCOUNTER — Encounter: Payer: Self-pay | Admitting: Family Medicine

## 2017-09-20 ENCOUNTER — Ambulatory Visit: Payer: Self-pay | Admitting: Allergy and Immunology

## 2017-10-04 ENCOUNTER — Encounter: Payer: Self-pay | Admitting: Family Medicine

## 2017-10-04 ENCOUNTER — Encounter: Payer: Self-pay | Admitting: Licensed Clinical Social Worker

## 2017-10-04 ENCOUNTER — Ambulatory Visit: Payer: Medicaid Other | Admitting: Family Medicine

## 2017-10-04 ENCOUNTER — Other Ambulatory Visit: Payer: Self-pay

## 2017-10-04 VITALS — BP 128/78 | HR 88 | Temp 98.2°F | Ht 69.0 in | Wt 307.0 lb

## 2017-10-04 DIAGNOSIS — Z79899 Other long term (current) drug therapy: Secondary | ICD-10-CM | POA: Diagnosis present

## 2017-10-04 DIAGNOSIS — Z3202 Encounter for pregnancy test, result negative: Secondary | ICD-10-CM | POA: Diagnosis not present

## 2017-10-04 DIAGNOSIS — F3160 Bipolar disorder, current episode mixed, unspecified: Secondary | ICD-10-CM | POA: Diagnosis not present

## 2017-10-04 DIAGNOSIS — L918 Other hypertrophic disorders of the skin: Secondary | ICD-10-CM | POA: Diagnosis not present

## 2017-10-04 DIAGNOSIS — E669 Obesity, unspecified: Secondary | ICD-10-CM | POA: Diagnosis not present

## 2017-10-04 DIAGNOSIS — J302 Other seasonal allergic rhinitis: Secondary | ICD-10-CM

## 2017-10-04 LAB — POCT URINE PREGNANCY: PREG TEST UR: NEGATIVE

## 2017-10-04 MED ORDER — LAMOTRIGINE 25 MG PO TABS: 25.0000 mg | ORAL_TABLET | Freq: Every day | ORAL | 1 refills | Status: DC

## 2017-10-04 MED ORDER — FLUTICASONE PROPIONATE 50 MCG/ACT NA SUSP: 1.0000 | Freq: Two times a day (BID) | NASAL | 4 refills | Status: DC

## 2017-10-04 NOTE — Progress Notes (Addendum)
Subjective:    Patient ID: Kylie Bailey, female    DOB: 11/13/1990, 27 y.o.   MRN: [id number]   CC: anxiety/depression  HPI:  Anxiety/Depression: -Patient reports that's he has history of anxiety, depression and was [facility] -she does not currently follow with [facility] and would like to avoid them again if possible -reports that she does have any SI or HI at this time but sometimes believes that her husband and daughter would "be better off" if she wasn't around -she often has to ask her daughter to leave the room -feels as if she has worse mood swings lately  -has tried Wellbutrin (short period of time) and Celexa which she believes helped her -previously given xanax and klonipin which she did not believed helped her so she did not take very many of these pills and was also worried she would become addicted - was previously prescribed a mood stabilizer but was unable to afford this and never picked it up She does reports that she is having some issues with her marriage right now and she feels that some of her mood swings are normal reactions to what is happening but she cannot always control them - Denies illicit drug usage, occasionally has alcoholic beverage (one drink every 1-2 months) and smokes when she is stressed, which is happening more frequently- father has abuse hx and she is very careful to not medicate as she saw what happened to him - reports since our last visit she lost 15 pounds when she was trying, but then [place] it back on given her stress  Obesity: -would like another card to reach Dr. Gerilyn PilgrimSykes for weight loss  Mole: - patient with mole under R arm since childhood and would like to have this removed for cosmetic reasons - no changing in size or color  Smoking status reviewed  Review of Systems Per HPI, also denies recent illness, chest pain, shortness of breath, weakness  Patient Active Problem List    Diagnosis Date Noted  . Bipolar disorder (HCC) 10/07/2017  . Skin tag 10/07/2017  . Seasonal allergies 08/18/2017  . Essential hypertension 12/03/2016  . Stress 12/11/2015  . Panic attacks 12/11/2015  . Obesity 12/11/2015  . Elevated BP without diagnosis of hypertension 12/11/2015     Objective:  BP 128/78 (BP Location: Left Arm, Patient Position: Sitting, Cuff Size: Large)   Pulse 88   Temp 98.2 F (36.8 C) (Oral)   Ht 5\' 9"  (1.753 m)   Wt (!) 307 lb (139.3 kg)   SpO2 99%   BMI 45.34 kg/m  Vitals and nursing note reviewed  General: NAD, pleasant Cardiac: RRR, normal heart sounds, no murmurs Respiratory: CTAB, normal effort Extremities: no edema or cyanosis. WWP. Skin: warm and dry, no rashes noted, skin tag present in R underarm Neuro: alert and oriented, no focal deficits Psych: Neatly groomed and appropriately dressed. Maintains good eye contact and is cooperative and attentive. Speech is normal volume and rate. Denies SI/ HI. Normal affect.  [facility] 10/04/2017 10/04/2017 08/09/2017  Decreased Interest 3 3 0  Down, Depressed, Hopeless 3 3 0  PHQ - 2 Score 6 6 0  Altered sleeping 3 - -  Tired, decreased energy 3 - -  Change in appetite 3 - -  Feeling bad or failure about yourself  3 - -  Trouble concentrating 3 - -  Moving slowly or fidgety/restless 0 - -  Suicidal thoughts 0 - -  PHQ-9 Score 21 - -  Difficult doing work/chores Very difficult - -   GAD 7 : Generalized Anxiety Score 10/05/2017 04/22/2017 12/02/2015  Nervous, Anxious, on Edge 3 1 0  Control/stop worrying 3 1 0  Worry too much - different things 3 1 1   Trouble relaxing 3 1 0  Restless 2 1 0  Easily annoyed or irritable 3 1 1   Afraid - awful might happen 3 1 0  Total GAD 7 Score 20 7 2   Anxiety Difficulty Very difficult - Not difficult at all   Assessment & Plan:    Bipolar disorder [facility]) Patient reports being diagnosed with Type 2 bipolar disorder by [facility], and she agreed with  this. She has never tried a mood stabilizer given cost, but would like to start one today if possible. Had some success with celexa and wellbutrin in the past, but understands that she should be on a mood stabilizer prior to starting an SSRI or similar tx.  After discussion with Dr. Raymondo Band, will start patient on lamotrigine tabs with the following titration:  Week 1- 2: 25 mg once daily   Weeks 3 and 4: 50 mg once daily   Week 5 and beyond: 100 mg once daily   Patient voiced understanding of risks and given return precautions including development of rash. Given handout on medication.   Pregnancy test negative in office today, and patient encouraged to think of contraceptive options now that she is starting this medication and does not currently plan on pregancy. Will follow up in 1 month.   Also spoke with integrated care team and patient to schedule follow up with our behavioral health. She is to follow up with me in 1 month to determine how the lamotrigine is working.   Obesity Patient given Dr. Gerilyn Pilgrim information again at her request and will schedule an appointment.   Skin tag Present under R underarm, patient to schedule appointment with derm clinic for removal for cosmetic reasons   [place] Milena Liggett, DO Family Medicine Resident PGY-2

## 2017-10-04 NOTE — Patient Instructions (Signed)
Thank you for coming to see me today. It was a pleasure! Today we talked about:   Your anxiety/depression/bipolar. I will start you on a mood stabilizer called lamotrigine. We will titrate you up slowly.   Week 1- 2: 25 mg once daily (1 tab)  Weeks 3 and 4: 50 mg once daily (2 tabs)  Week 5 and beyond: 100 mg once daily (4 tabs)  Please follow-up with me in 1 month or sooner as needed. Please schedule a follow up appointment with the derm clinic for removal of your mole.  Please call Dr. Gerilyn PilgrimSykes for nutrition.  Please call our behavioral health team to schedule a follow up.   If you have any questions or concerns, please do not hesitate to call the office at [phone].  Take Care,   [place] Riann Oman, DO   Lamotrigine tablets What is this medicine? LAMOTRIGINE (la MOE Patrecia Pacetri jeen) is used to control seizures in adults and children with epilepsy and Lennox-Gastaut syndrome. It is also used in adults to treat bipolar disorder. This medicine may be used for other purposes; ask your health care provider or pharmacist if you have questions. COMMON BRAND NAME(S): Lamictal What should I tell my health care provider before I take this medicine? They need to know if you have any of these conditions: -a history of depression or bipolar disorder -aseptic meningitis during prior use of lamotrigine -folate deficiency -kidney disease -liver disease -suicidal thoughts, plans, or attempt; a previous suicide attempt by you or a family member -an unusual or allergic reaction to lamotrigine or other seizure medications, other medicines, foods, dyes, or preservatives -pregnant or trying to get pregnant -breast-feeding How should I use this medicine? Take this medicine by mouth with a glass of water. Follow the directions on the prescription label. Do not chew these tablets. If this medicine upsets your stomach, take it with food or milk. Take your doses at regular intervals. Do not take your medicine  more often than directed. A special MedGuide will be given to you by the pharmacist with each new prescription and refill. Be sure to read this information carefully each time. Talk to your pediatrician regarding the use of this medicine in children. While this drug may be prescribed for children as young as 2 years for selected conditions, precautions do apply. Overdosage: If you think you have taken too much of this medicine contact a poison control center or emergency room at once. NOTE: This medicine is only for you. Do not share this medicine with others. What if I miss a dose? If you miss a dose, take it as soon as you can. If it is almost time for your next dose, take only that dose. Do not take double or extra doses. What may interact with this medicine? -carbamazepine -female hormones, including contraceptive or birth control pills -methotrexate -phenobarbital -phenytoin -primidone -pyrimethamine -rifampin -trimethoprim -valproic acid This list may not describe all possible interactions. Give your health care provider a list of all the medicines, herbs, non-prescription drugs, or dietary supplements you use. Also tell them if you smoke, drink alcohol, or use illegal drugs. Some items may interact with your medicine. What should I watch for while using this medicine? Visit your doctor or health care professional for regular checks on your progress. If you take this medicine for seizures, wear a Medic Alert bracelet or necklace. Carry an identification card with information about your condition, medicines, and doctor or health care professional. It is important to take  this medicine exactly as directed. When first starting treatment, your dose will need to be adjusted slowly. It may take weeks or months before your dose is stable. You should contact your doctor or health care professional if your seizures get worse or if you have any new types of seizures. Do not stop taking this medicine  unless instructed by your doctor or health care professional. Stopping your medicine suddenly can increase your seizures or their severity. Contact your doctor or health care professional right away if you develop a rash while taking this medicine. Rashes may be very severe and sometimes require treatment in the hospital. Deaths from rashes have occurred. Serious rashes occur more often in children than adults taking this medicine. It is more common for these serious rashes to occur during the first 2 months of treatment, but a rash can occur at any time. You may get drowsy, dizzy, or have blurred vision. Do not drive, use machinery, or do anything that needs mental alertness until you know how this medicine affects you. To reduce dizzy or fainting spells, do not sit or stand up quickly, especially if you are an older patient. Alcohol can increase drowsiness and dizziness. Avoid alcoholic drinks. If you are taking this medicine for bipolar disorder, it is important to report any changes in your mood to your doctor or health care professional. If your condition gets worse, you get mentally depressed, feel very hyperactive or manic, have difficulty sleeping, or have thoughts of hurting yourself or committing suicide, you need to get help from your health care professional right away. If you are a caregiver for someone taking this medicine for bipolar disorder, you should also report these behavioral changes right away. The use of this medicine may increase the chance of suicidal thoughts or actions. Pay special attention to how you are responding while on this medicine. Your mouth may get dry. Chewing sugarless gum or sucking hard candy, and drinking plenty of water may help. Contact your doctor if the problem does not go away or is severe. Women who become pregnant while using this medicine may enroll in the [place] American Antiepileptic Drug Pregnancy Registry by calling [phone]. This registry collects  information about the safety of antiepileptic drug use during pregnancy. What side effects may I notice from receiving this medicine? Side effects that you should report to your doctor or health care professional as soon as possible: -allergic reactions like skin rash, itching or hives, swelling of the face, lips, or tongue -blurred or double vision -difficulty walking or controlling muscle movements -fever -headache, stiff neck, and sensitivity to light -painful sores in the mouth, eyes, or nose -redness, blistering, peeling or loosening of the skin, including inside the mouth -severe muscle pain -swollen lymph glands -uncontrollable eye movements -unusual bruising or bleeding -unusually weak or tired -vomiting -worsening of mood, thoughts or actions of suicide or dying -yellowing of the eyes or skin Side effects that usually do not require medical attention (report to your doctor or health care professional if they continue or are bothersome): -diarrhea or constipation -difficulty sleeping -nausea -tremors This list may not describe all possible side effects. Call your doctor for medical advice about side effects. You may report side effects to FDA at 1-800-FDA-1088. Where should I keep my medicine? Keep out of reach of children. Store at room temperature between 15 and 30 degrees C (59 and 86 degrees F). Throw away any unused medicine after the expiration date. NOTE: This sheet is a summary.  It may not cover all possible information. If you have questions about this medicine, talk to your doctor, pharmacist, or health care provider.  2018 Elsevier/Gold Standard (2015-04-24 09:29:40)

## 2017-10-04 NOTE — Progress Notes (Signed)
Type of Service: Integrated Behavioral Health  Estimate Time:15 minutes Interpreter:No.   [name] is a 27 y.o. female referred by Dr. Talbert ForestShirley for symptoms of  anxiety and depression. Patient is pleasant and engaged in conversation, was accompanied by her minor daughter. Mental health / substance use: family history of mental health with father; history of depression, [facility]; [facility], no history of previous counseling or therapy. Appearance:Neat ; Thought process: Coherent; Affect: Appropriate : No plan to harm self or others. Scores from GAD and PHQ-9 indication of severe depression and anxiety.  GAD 7 : Generalized Anxiety Score 10/05/2017 04/22/2017 12/02/2015  Nervous, Anxious, on Edge 3 1 0  Control/stop worrying 3 1 0  Worry too much - different things 3 1 1   Trouble relaxing 3 1 0  Restless 2 1 0  Easily annoyed or irritable 3 1 1   Afraid - awful might happen 3 1 0  Total GAD 7 Score 20 7 2   Anxiety Difficulty Very difficult - Not difficult at all   Depression screen [facility] 2/9 10/04/2017 10/04/2017 08/09/2017  Decreased Interest 3 3 0  Down, Depressed, Hopeless 3 3 0  PHQ - 2 Score 6 6 0  Altered sleeping 3 - -  Tired, decreased energy 3 - -  Change in appetite 3 - -  Feeling bad or failure about yourself  3 - -  Trouble concentrating 3 - -  Moving slowly or fidgety/restless 0 - -  Suicidal thoughts 0 - -  PHQ-9 Score 21 - -  Difficult doing work/chores Very difficult - -   LIFE /SOCIAL :  patient lives with daughter and sigificant other Recent Life changes: none reported GOALS ADDRESSED:  Patient will: 1. Reduce symptoms of: anxiety and depression 2. Increase knowledge and/or ability of: coping skills and self-management skills  3. Demonstrate ability to: Increase motivation to adhere to plan of care INTERVENTION:  Supportive Counseling, Reflective listening, ,    ISSUES DISCUSSED: Integrated care services,  support system, previous and current coping skills, community resources.  ASSESSMENT:Patient currently experiencing symptoms of depression. Patient may benefit from, and is in agreement to receive further assessment and brief therapeutic interventions to assist with managing her symptoms. PCP starting patient on new medication today.  PLAN:   1.Patient will F/U with LCSW in 2 weeks  2. Behavioral recommendations: take medication per PCP  3. Referral:none at this time   Warm Hand Off Completed.     Sammuel Hineseborah Moore, LCSW Licensed Clinical Social Worker [facility]   [phone] 8:55 AM

## 2017-10-07 DIAGNOSIS — L918 Other hypertrophic disorders of the skin: Secondary | ICD-10-CM | POA: Insufficient documentation

## 2017-10-07 DIAGNOSIS — F319 Bipolar disorder, unspecified: Secondary | ICD-10-CM | POA: Insufficient documentation

## 2017-10-07 NOTE — Assessment & Plan Note (Addendum)
Patient reports being diagnosed with Type 2 bipolar disorder by [facility], and she agreed with this. She has never tried a mood stabilizer given cost, but would like to start one today if possible. Had some success with celexa and wellbutrin in the past, but understands that she should be on a mood stabilizer prior to starting an SSRI or similar tx.  After discussion with Dr. Raymondo BandKoval, will start patient on lamotrigine tabs with the following titration:  Week 1- 2: 25 mg once daily   Weeks 3 and 4: 50 mg once daily   Week 5 and beyond: 100 mg once daily   Patient voiced understanding of risks and given return precautions including development of rash. Given handout on medication.   Pregnancy test negative in office today, and patient encouraged to think of contraceptive options now that she is starting this medication and does not currently plan on pregancy. Will follow up in 1 month.   Also spoke with integrated care team and patient to schedule follow up with our behavioral health. She is to follow up with me in 1 month to determine how the lamotrigine is working.

## 2017-10-07 NOTE — Assessment & Plan Note (Signed)
Patient given Dr. Gerilyn PilgrimSykes information again at her request and will schedule an appointment.

## 2017-10-07 NOTE — Assessment & Plan Note (Signed)
Present under R underarm, patient to schedule appointment with derm clinic for removal for cosmetic reasons

## 2017-10-19 ENCOUNTER — Ambulatory Visit: Payer: Medicaid Other

## 2017-10-31 ENCOUNTER — Other Ambulatory Visit: Payer: Self-pay

## 2017-10-31 ENCOUNTER — Ambulatory Visit: Payer: Medicaid Other | Admitting: Family Medicine

## 2017-10-31 ENCOUNTER — Encounter: Payer: Self-pay | Admitting: Family Medicine

## 2017-10-31 VITALS — BP 108/71 | HR 83 | Temp 98.6°F | Ht 69.0 in | Wt 308.0 lb

## 2017-10-31 DIAGNOSIS — N912 Amenorrhea, unspecified: Secondary | ICD-10-CM

## 2017-10-31 DIAGNOSIS — Z3A01 Less than 8 weeks gestation of pregnancy: Secondary | ICD-10-CM

## 2017-10-31 DIAGNOSIS — Z3480 Encounter for supervision of other normal pregnancy, unspecified trimester: Secondary | ICD-10-CM | POA: Insufficient documentation

## 2017-10-31 LAB — POCT URINE PREGNANCY: Preg Test, Ur: POSITIVE — AB

## 2017-10-31 MED ORDER — PRENATAL ADULT GUMMY/DHA/FA 0.4-25 MG PO CHEW: 1.0000 | CHEWABLE_TABLET | Freq: Every day | ORAL | 3 refills | Status: DC

## 2017-10-31 NOTE — Progress Notes (Signed)
   Subjective:   Patient ID: Kylie Bailey    DOB: 08/13/1990, 27 y.o. female   MRN: [id number]  CC: confirm pregnancy   HPI: Kylie Bailey is a 27 y.o. female who presents to clinic today for the following issue.  Pregnancy  Patient presents as same day visit to confirm pregnancy. Took a home pregnancy test, urine pregnancy test positive. She is about 5 weeks 1 day today based on LMP.  She is a G2P1001 and last child was about 4 years ago.  She and her husband are excited about this pregnancy. Pregnancy was unplanned but is desired. Feels as though she has good support from husband and family.  She endorses symptoms of nausea, hasn't really vomited yet.  She has been taking prenatal vitamins.  She does have some dull abdominal pain.  She is a current daily smoker and is quitting today.  No alcohol or illicit drug use noted.   ROS: No fever, chills, abdominal pain.  No vaginal discharge or bleeding.  No chest pain, shortness of breath.  Social: Patient is a current everyday smoker, plans on quitting today. Medications reviewed. Objective:   BP 108/71   Pulse 83   Temp 98.6 F (37 C) (Oral)   Ht 5\' 9"  (1.753 m)   Wt (!) 308 lb (139.7 kg)   LMP 09/25/2017   SpO2 98%   BMI 45.48 kg/m  Vitals and nursing note reviewed.  General: Well-appearing 27 year old female, NAD HEENT: NCAT, EOMI, PERRL, MMM, oropharynx clear Neck: supple, non-tender, normal ROM, no LAD  CV: RRR no MRG  Lungs: CTAB, normal effort  Abdomen: soft, NTND, no masses or organomegaly, +bs  Skin: warm, dry, no rash Extremities: warm and well perfused, normal tone Neuro: alert, oriented x3, no focal deficits   Assessment & Plan:   Pregnancy Positive urine pregnancy in office.  Pregnancy is desired, although unplanned.  Per LMP, she is about 5 weeks, not taking PNV.  Advised patient to start these today. - Rx: Prenatal vitamin - Will need to schedule visit for initial OB prenatal - All  questions and concerns addressed at this visit - Return precautions discussed  Orders Placed This Encounter  Procedures  . POCT urine pregnancy   Meds ordered this encounter  Medications  . Prenatal MV & Min w/FA-DHA (PRENATAL ADULT GUMMY/DHA/FA) 0.4-25 MG CHEW    Sig: Chew 1 tablet by mouth daily.    Dispense:  90 tablet    Refill:  3    Freddrick MarchYashika Jacia Sickman, MD [facility], PGY-3 10/31/2017 3:47 PM

## 2017-10-31 NOTE — Assessment & Plan Note (Signed)
Positive urine pregnancy in office.  Pregnancy is desired, although unplanned.  Per LMP, she is about 5 weeks, not taking PNV.  Advised patient to start these today. - Rx: Prenatal vitamin - Will need to schedule visit for initial OB prenatal - All questions and concerns addressed at this visit - Return precautions discussed

## 2017-10-31 NOTE — Patient Instructions (Addendum)
It was so nice meeting you today!  You were seen in clinic to confirm pregnancy and had a positive pregnancy test.   As we discussed, I would like you to start taking prenatal vitamins daily. I have sent in a prescription to your pharmacy for this.  Additionally, you will need to schedule an initial OB visit to go over your medical, family and genetic history, medications and other pregnancy related issues.  Please call clinic if you have any questions.    Congratulations again, Kylie MarchYashika Anaclara Acklin MD

## 2017-11-01 ENCOUNTER — Inpatient Hospital Stay (HOSPITAL_COMMUNITY)
Admission: AD | Admit: 2017-11-01 | Discharge: 2017-11-01 | Disposition: A | Discharge status: Home / Self Care | Payer: Medicaid Other | Source: Ambulatory Visit | Attending: Family Medicine | Admitting: Family Medicine

## 2017-11-01 ENCOUNTER — Inpatient Hospital Stay (HOSPITAL_COMMUNITY): Payer: Medicaid Other

## 2017-11-01 ENCOUNTER — Encounter (HOSPITAL_COMMUNITY): Payer: Self-pay | Admitting: *Deleted

## 2017-11-01 DIAGNOSIS — O99331 Smoking (tobacco) complicating pregnancy, first trimester: Secondary | ICD-10-CM | POA: Insufficient documentation

## 2017-11-01 DIAGNOSIS — O26891 Other specified pregnancy related conditions, first trimester: Secondary | ICD-10-CM | POA: Diagnosis not present

## 2017-11-01 DIAGNOSIS — R109 Unspecified abdominal pain: Secondary | ICD-10-CM

## 2017-11-01 DIAGNOSIS — O3680X0 Pregnancy with inconclusive fetal viability, not applicable or unspecified: Secondary | ICD-10-CM

## 2017-11-01 DIAGNOSIS — F17210 Nicotine dependence, cigarettes, uncomplicated: Secondary | ICD-10-CM | POA: Diagnosis not present

## 2017-11-01 DIAGNOSIS — Z3A01 Less than 8 weeks gestation of pregnancy: Secondary | ICD-10-CM

## 2017-11-01 DIAGNOSIS — O26899 Other specified pregnancy related conditions, unspecified trimester: Secondary | ICD-10-CM

## 2017-11-01 DIAGNOSIS — J302 Other seasonal allergic rhinitis: Secondary | ICD-10-CM

## 2017-11-01 LAB — HCG, QUANTITATIVE, PREGNANCY: hCG, Beta Chain, Quant, S: 922 m[IU]/mL — ABNORMAL HIGH (ref <5)

## 2017-11-01 LAB — ABO/RH: ABO/RH(D): A POS

## 2017-11-01 LAB — URINALYSIS, ROUTINE W REFLEX MICROSCOPIC
Bilirubin Urine: NEGATIVE
Glucose, UA: NEGATIVE mg/dL
Hgb urine dipstick: NEGATIVE
Ketones, ur: NEGATIVE mg/dL
LEUKOCYTES UA: NEGATIVE
NITRITE: NEGATIVE
Protein, ur: NEGATIVE mg/dL
SPECIFIC GRAVITY, URINE: 1.021 (ref 1.005–1.030)
pH: 5 (ref 5.0–8.0)

## 2017-11-01 LAB — CBC
HCT: 42.2 % (ref 36.0–46.0)
HEMOGLOBIN: 14.6 g/dL (ref 12.0–15.0)
MCH: 32.3 pg (ref 26.0–34.0)
MCHC: 34.6 g/dL (ref 30.0–36.0)
MCV: 93.4 fL (ref 78.0–100.0)
PLATELETS: 232 10*3/uL (ref 150–400)
RBC: 4.52 MIL/uL (ref 3.87–5.11)
RDW: 12.6 % (ref 11.5–15.5)
WBC: 8.1 10*3/uL (ref 4.0–10.5)

## 2017-11-01 LAB — WET PREP, GENITAL
CLUE CELLS WET PREP: NONE SEEN
SPERM: NONE SEEN
Trich, Wet Prep: NONE SEEN
WBC WET PREP: NONE SEEN
Yeast Wet Prep HPF POC: NONE SEEN

## 2017-11-01 NOTE — MAU Note (Signed)
Pt C/O lower abd cramping that radiates into her back for the last 1-2 weeks, denies bleeding.

## 2017-11-01 NOTE — MAU Provider Note (Signed)
History     CSN: [id number]  Arrival date and time:[id number] 11/01/17 1208   None     Chief Complaint  Patient presents with  . Abdominal Pain  . Back Pain   HPI   Kylie Bailey is 27 y.o. female at G2P1001 at 7935w2d by LMP, [facility]. Presents to MAU today with complaint of cramping bilateral lower abdominal pain that has been occurring intermittently for the past 1-2 weeks and increased in severity in the past 24 hours. Pain radiates to her low back. She denies vaginal bleeding, LOF, and vaginal discharge.   OB History    Gravida  2   Para  1   Term  1   Preterm      AB      Living  1     SAB      TAB      Ectopic      Multiple      Live Births  1           Past Medical History:  Diagnosis Date  . GERD (gastroesophageal reflux disease)   . Medical history non-contributory   . Migraines     Past Surgical History:  Procedure Laterality Date  . TONSILLECTOMY    . TYMPANOSTOMY TUBE PLACEMENT Bilateral     Family History  Problem Relation Age of Onset  . Hypertension Mother   . Heart attack Mother   . Drug abuse Father   . High Cholesterol Brother   . Diabetes Paternal Grandmother   . High blood pressure Paternal Grandmother   . Heart disease Paternal Grandmother   . Heart disease Maternal Uncle     Social History   Tobacco Use  . Smoking status: Current Every Day Smoker    Packs/day: 0.50    Years: 3.00    Pack years: 1.50    Types: Cigarettes  . Smokeless tobacco: Never Used  . Tobacco comment: working on quitting, at 7/day now - 10/31/17  Substance Use Topics  . Alcohol use: No    Comment: occ  . Drug use: No    Allergies: No Known Allergies  Medications Prior to Admission  Medication Sig Dispense Refill Last Dose  . albuterol (PROVENTIL HFA;VENTOLIN HFA) 108 (90 Base) MCG/ACT inhaler Inhale 2 puffs into the lungs every 4 (four) hours as needed for wheezing or shortness of breath. 1 Inhaler 0  Unknown at Unknown time  . fluticasone (FLONASE) 50 MCG/ACT nasal spray Place 1 spray into both nostrils 2 (two) times daily. 15.8 g 4   . ibuprofen (ADVIL,MOTRIN) 800 MG tablet Take 1 tablet (800 mg total) by mouth 3 (three) times daily. 30 tablet 0   . lamoTRIgine (LAMICTAL) 25 MG tablet Take 1 tablet (25 mg total) by mouth daily. Week 1- 2: 25 mg once daily (1 tab) Weeks 3 and 4: 50 mg once daily (2 tabs) 60 tablet 1   . levocetirizine (XYZAL) 5 MG tablet Take 1 tablet (5 mg total) by mouth every evening. 30 tablet 0 Unknown at Unknown time  . loratadine (CLARITIN) 10 MG tablet Take 1 tablet (10 mg total) by mouth daily. 30 tablet 11   . Prenatal MV & Min w/FA-DHA (PRENATAL ADULT GUMMY/DHA/FA) 0.4-25 MG CHEW Chew 1 tablet by mouth daily. 90 tablet 3   . verapamil (CALAN-SR) 120 MG CR tablet Take 1 tablet (120 mg total) by mouth at bedtime. 30 tablet 11     Review of Systems  Constitutional: Negative for chills and fever.  Respiratory: Negative for shortness of breath.   Cardiovascular: Negative for chest pain.  Gastrointestinal: Positive for abdominal pain. Negative for diarrhea, nausea and vomiting.  Genitourinary: Negative for difficulty urinating, dysuria and urgency.  All other systems reviewed and are negative.  Physical Exam   Blood pressure 127/81, pulse 77, temperature 98 F (36.7 C), temperature source Oral, resp. rate 18, weight (!) 308 lb (139.7 kg), last menstrual period 09/25/2017.  Physical Exam  Constitutional: She is oriented to person, place, and time. She appears well-developed and well-nourished. No distress.  HENT:  Head: Normocephalic and atraumatic.  Eyes: Conjunctivae and EOM are normal.  Cardiovascular: Normal rate and regular rhythm.  Respiratory: Effort normal and breath sounds normal.  GI: Soft. Bowel sounds are normal. She exhibits no distension and no mass. There is no tenderness. There is no rebound and no guarding.  Musculoskeletal: Normal range of  motion. She exhibits no deformity.  Neurological: She is alert and oriented to person, place, and time.  Skin: Skin is warm and dry.  Psychiatric: She has a normal mood and affect. Her behavior is normal.   Cervical Exam:  Dilation: Closed Effacement (%): Thick MAU Course  Procedures  MDM Given presentation of abdominal pain with early pregnancy without prior confirmation of IUP, will obtain ultrasound. Will also obtain ABO/Rh, CBC, and quant bHCG. Cervix closed and thick. Cultures and wet prep obtained. Wet prep negative. Ultrasound result with probable intrauterine gestation sac but no yolk sac, fetal pole, or cardiac activity visualized. Possible small subchorionic hemorrhage may be present. Recommended f/u B-hCG and f/u sono in 14 days.   Assessment and Plan   1. Pregnancy of unknown anatomic location   2. Abdominal pain during pregnancy in first trimester    Ultrasound with probable intrauterine gestational sac. Labs unremarkable. To confirm IUP, will need repeat b-HCG in 48 hours. This has been scheduled. Will anticipate doubling of result if IUP present. Would likely benefit from f/u ultrasound. Patient is well appearing and VSS. Abdominal exam benign and sono without evidence of ovarian abnormalities. Strict return precautions including worsening of pain and vaginal bleeding discussed.    De Hollingshead 11/01/2017, 1:28 PM

## 2017-11-01 NOTE — Discharge Instructions (Signed)
Your ultrasound showed a sac within the uterus but was unable to identify any content within that sac. Therefore, we still need to rule out an ectopic pregnancy (a pregnancy not within the uterus) with a repeat blood test of beta-HCG in two days at the [facility]. We expect the numbers to double within 48 hours if this is a pregnancy in the correct location.   Please return to the MAU if you have bleeding or significant increase in your pain.

## 2017-11-02 LAB — GC/CHLAMYDIA PROBE AMP (~~LOC~~) NOT AT ARMC
Chlamydia: NEGATIVE
NEISSERIA GONORRHEA: NEGATIVE

## 2017-11-03 ENCOUNTER — Ambulatory Visit (INDEPENDENT_AMBULATORY_CARE_PROVIDER_SITE_OTHER): Payer: Medicaid Other | Admitting: General Practice

## 2017-11-03 DIAGNOSIS — O283 Abnormal ultrasonic finding on antenatal screening of mother: Secondary | ICD-10-CM

## 2017-11-03 DIAGNOSIS — O3680X0 Pregnancy with inconclusive fetal viability, not applicable or unspecified: Secondary | ICD-10-CM

## 2017-11-03 LAB — HCG, QUANTITATIVE, PREGNANCY: hCG, Beta Chain, Quant, S: 2841 m[IU]/mL — ABNORMAL HIGH (ref <5)

## 2017-11-03 NOTE — Progress Notes (Signed)
Patient presents to office today for stat bhcg. Patient denies pain just reports pulling and pressure sensation. Patient denies bleeding. Discussed with patient we are monitoring her bhcg levels today & asked she wait in lobby for results/updated plan of care. Patient verbalized understanding & had no questions at this time.   Reviewed results with Dr Vergie LivingPickens who finds appropriate rise in bhcg levels- patient should have follow up ultrasound in 2 weeks. Scheduled for 8/16 @ 10am.  Informed patient of results & ultrasound appt. Patient verbalized understanding to all & had no questions at this time.

## 2017-11-05 ENCOUNTER — Encounter (HOSPITAL_COMMUNITY): Payer: Self-pay | Admitting: Emergency Medicine

## 2017-11-05 ENCOUNTER — Ambulatory Visit (HOSPITAL_COMMUNITY)
Admission: EM | Admit: 2017-11-05 | Discharge: 2017-11-05 | Disposition: A | Discharge status: Home / Self Care | Payer: Medicaid Other | Attending: Family Medicine | Admitting: Family Medicine

## 2017-11-05 ENCOUNTER — Other Ambulatory Visit: Payer: Self-pay

## 2017-11-05 DIAGNOSIS — H9201 Otalgia, right ear: Secondary | ICD-10-CM

## 2017-11-05 NOTE — Discharge Instructions (Signed)
You may use over the counter acetaminophen as needed. °

## 2017-11-05 NOTE — ED Triage Notes (Signed)
Right ear pain and pain down right side of neck.  Symptoms started last week.

## 2017-11-07 NOTE — ED Provider Notes (Signed)
[facility]   [id number] 11/05/17 Arrival Time: 1158  ASSESSMENT & PLAN:  1. Right ear pain    Question eustacian tube dysfunction vs early viral URI/ST. Decongestant OTC. Analgesics OTC. May f/u with PCP or here if not improving over the next few days. No sign of bacterial infection.  Reviewed expectations re: course of current medical issues. Questions answered. Outlined signs and symptoms indicating need for more acute intervention. Patient verbalized understanding. After Visit Summary given.   SUBJECTIVE: History from: patient.  Kylie Bailey is a 27 y.o. female who presents with complaint of right otalgia without drainage. Onset gradual, approximately 1 week ago. Comes and goes. Recent cold symptoms: minimal. Fever: no. Overall normal PO intake without n/v. Sick contacts: no. OTC treatment: none reported. No hearing changes.  Social History   Tobacco Use  Smoking Status Current Every Day Smoker  . Packs/day: 0.50  . Years: 3.00  . Pack years: 1.50  . Types: Cigarettes  Smokeless Tobacco Never Used  Tobacco Comment   working on quitting, at 7/day now - 10/31/17    ROS: As per HPI.   OBJECTIVE:  Vitals:   11/05/17 1315  BP: (!) 118/95  Pulse: 77  Resp: 18  Temp: 97.9 F (36.6 C)  TempSrc: Oral  SpO2: 97%     General appearance: alert; appears fatigued Ear Canal: normal TM: normal but with old scarring; no bleeding Neck: supple without LAD Lungs: unlabored respirations, symmetrical air entry; no respiratory distress Skin: warm and dry Psychological: alert and cooperative; normal mood and affect  No Known Allergies  Past Medical History:  Diagnosis Date  . GERD (gastroesophageal reflux disease)   . Medical history non-contributory   . Migraines    Family History  Problem Relation Age of Onset  . Hypertension Mother   . Heart attack Mother   . Drug abuse Father   . High Cholesterol Brother   . Diabetes Paternal  Grandmother   . High blood pressure Paternal Grandmother   . Heart disease Paternal Grandmother   . Heart disease Maternal Uncle    Social History   Socioeconomic History  . Marital status: Married    Spouse name: Not on file  . Number of children: Not on file  . Years of education: Not on file  . Highest education level: Not on file  Occupational History  . Not on file  Social Needs  . Financial resource strain: Not on file  . Food insecurity:    Worry: Not on file    Inability: Not on file  . Transportation needs:    Medical: Not on file    Non-medical: Not on file  Tobacco Use  . Smoking status: Current Every Day Smoker    Packs/day: 0.50    Years: 3.00    Pack years: 1.50    Types: Cigarettes  . Smokeless tobacco: Never Used  . Tobacco comment: working on quitting, at 7/day now - 10/31/17  Substance and Sexual Activity  . Alcohol use: No    Comment: occ  . Drug use: No  . Sexual activity: Yes    Birth control/protection: None  Lifestyle  . Physical activity:    Days per week: Not on file    Minutes per session: Not on file  . Stress: Not on file  Relationships  . Social connections:    Talks on phone: Not on file    Gets together: Not on file    Attends religious service: Not on  file    Active member of club or organization: Not on file    Attends meetings of clubs or organizations: Not on file    Relationship status: Not on file  . Intimate partner violence:    Fear of current or ex partner: Not on file    Emotionally abused: Not on file    Physically abused: Not on file    Forced sexual activity: Not on file  Other Topics Concern  . Not on file  Social History Narrative  . Not on file            Mardella LaymanHagler, Saria Haran, MD 11/15/17 1112

## 2017-11-07 NOTE — Progress Notes (Signed)
I have reviewed the chart and agree with nursing staff's documentation of this patient's encounter.  [place] Bingharlie Cassidy Tashiro, MD 11/07/2017 11:03 AM

## 2017-11-09 ENCOUNTER — Encounter: Payer: Medicaid Other | Admitting: Family Medicine

## 2017-11-09 NOTE — Progress Notes (Deleted)
Kylie Bailey is a 27 y.o. yo G2P1001 at 163w3d who presents for her initial prenatal visit. Pregnancy {is/is not:9024} planned She reports {pregnancy symptoms:18128}. She  {is/is not:9024} taking PNV. See flow sheet for details.  PMH, POBH, FH, meds, allergies and Social Hx reviewed.  Prenatal Exam: Gen: Well nourished, well developed.  No distress.  Vitals noted. HEENT: Normocephalic, atraumatic.  Neck supple without cervical lymphadenopathy, thyromegaly or thyroid nodules.  Fair dentition. CV: RRR no murmur, gallops or rubs Lungs: CTAB.  Normal respiratory effort without wheezes or rales. Abd: soft, NTND. +BS.  Uterus not appreciated above pelvis. GU: Normal external female genitalia without lesions.  Normal vaginal, well rugated without lesions. No vaginal discharge.  Bimanual exam: No adnexal mass or TTP. No CMT.  Uterus size *** Ext: No clubbing, cyanosis or edema. Psych: Normal grooming and dress.  Not depressed or anxious appearing.  Normal thought content and process without flight of ideas or looseness of associations.  Assessment & Plan: 1) 27 y.o. yo G2P1001 at 6763w3d via {Ob dating:14516} doing well.  Current pregnancy issues include ***. Dating {is/is not:9024} reliable. Prenatal labs reviewed, notable for ***. Genetic screening offered: ***. Early glucola {is/is X1782380not:19887} indicated.  PHQ-9 and Pregnancy Medical Home forms completed and reviewed.  Bleeding and pain precautions reviewed. Importance of prenatal vitamins reviewed.  Follow up in 4 weeks.

## 2017-11-10 ENCOUNTER — Telehealth: Payer: Self-pay | Admitting: Family Medicine

## 2017-11-10 ENCOUNTER — Encounter: Payer: Self-pay | Admitting: Family Medicine

## 2017-11-10 NOTE — Telephone Encounter (Signed)
Called patient and left voicemail asking her to return my call regarding her medications needing to be discontinued during pregancy. Patient did no show for her initial OB visit on 8/7, and she will need to discontinue her verapamil and lamotrigine if she is taking them.

## 2017-11-10 NOTE — Telephone Encounter (Signed)
Mellinda returned call from Dr. Talbert ForestShirley. Route to Dr. Talbert ForestShirley.

## 2017-11-12 ENCOUNTER — Encounter (HOSPITAL_COMMUNITY): Payer: Self-pay | Admitting: Emergency Medicine

## 2017-11-12 ENCOUNTER — Ambulatory Visit (HOSPITAL_COMMUNITY)
Admission: EM | Admit: 2017-11-12 | Discharge: 2017-11-12 | Disposition: A | Discharge status: Home / Self Care | Payer: Medicaid Other | Attending: Internal Medicine | Admitting: Internal Medicine

## 2017-11-12 ENCOUNTER — Other Ambulatory Visit: Payer: Self-pay

## 2017-11-12 DIAGNOSIS — R42 Dizziness and giddiness: Secondary | ICD-10-CM

## 2017-11-12 HISTORY — DX: Essential (primary) hypertension: I10

## 2017-11-12 LAB — POCT URINALYSIS DIP (DEVICE)
Bilirubin Urine: NEGATIVE
GLUCOSE, UA: NEGATIVE mg/dL
Hgb urine dipstick: NEGATIVE
KETONES UR: NEGATIVE mg/dL
LEUKOCYTES UA: NEGATIVE
Nitrite: NEGATIVE
PROTEIN: NEGATIVE mg/dL
SPECIFIC GRAVITY, URINE: 1.02 (ref 1.005–1.030)
Urobilinogen, UA: 0.2 mg/dL (ref 0.0–1.0)
pH: 5.5 (ref 5.0–8.0)

## 2017-11-12 NOTE — ED Triage Notes (Signed)
Patient is off medication due to pregnancy.  Patient is dizzy, nauseated, symptoms started today.

## 2017-11-12 NOTE — ED Provider Notes (Signed)
[facility]    CSN: [id number] Arrival date & time: 11/12/17  1150     History   Chief Complaint Chief Complaint  Patient presents with  . Hypertension    HPI Kylie Bailey is a 27 y.o. female.   Kylie Bailey presents with complaints of intermittent dizziness and lightheadness which has been ongoing for the past two days. Stopped taking verapamil which she took for migraines and her borderline bp, 3 days ago. Found out she is pregnant, approximately 6 weeks, LMP 6/23. She has had nausea and vomiting, last emesis this morning x1. States she had palpitations which occurred at time of emesis. She has been eating and drinking. No headache, no abdominal pain, no fevers, no bleeding. Previous pregnancy did not require BP medications. Has first OB 8/14. Taking a prenatal vitamin. Hx of gerd, htn, migraines.     ROS per HPI.      Past Medical History:  Diagnosis Date  . GERD (gastroesophageal reflux disease)   . Hypertension   . Medical history non-contributory   . Migraines     Patient Active Problem List   Diagnosis Date Noted  . Pregnancy 10/31/2017  . Bipolar disorder (HCC) 10/07/2017  . Skin tag 10/07/2017  . Seasonal allergies 08/18/2017  . Essential hypertension 12/03/2016  . Stress 12/11/2015  . Panic attacks 12/11/2015  . Obesity 12/11/2015  . Elevated BP without diagnosis of hypertension 12/11/2015    Past Surgical History:  Procedure Laterality Date  . TONSILLECTOMY    . TYMPANOSTOMY TUBE PLACEMENT Bilateral     OB History    Gravida  2   Para  1   Term  1   Preterm      AB      Living  1     SAB      TAB      Ectopic      Multiple      Live Births  1            Home Medications    Prior to Admission medications   Medication Sig Start Date End Date Taking? Authorizing Provider  acetaminophen (TYLENOL) 325 MG tablet Take 650 mg by mouth every 6 (six) hours as needed for moderate pain.    [provider]  albuterol (PROVENTIL HFA;VENTOLIN HFA) 108 (90 Base) MCG/ACT inhaler Inhale 2 puffs into the lungs every 4 (four) hours as needed for wheezing or shortness of breath. Patient not taking: Reported on 11/01/2017 06/24/17   Loletta Specter, PA-C  calcium carbonate (TUMS - DOSED IN MG ELEMENTAL CALCIUM) 500 MG chewable tablet Chew 2 tablets by mouth 2 (two) times daily as needed for indigestion or heartburn.    [provider]  fluticasone (FLONASE) 50 MCG/ACT nasal spray Place 1 spray into both nostrils 2 (two) times daily. Patient not taking: Reported on 11/01/2017 10/04/17   [name], [place], DO  lamoTRIgine (LAMICTAL) 25 MG tablet Take 1 tablet (25 mg total) by mouth daily. Week 1- 2: 25 mg once daily (1 tab) Weeks 3 and 4: 50 mg once daily (2 tabs) Patient not taking: Reported on 11/01/2017 10/04/17   [name], [place], DO  loratadine (CLARITIN) 10 MG tablet Take 1 tablet (10 mg total) by mouth daily. 08/09/17   [name], [place], DO  Prenatal MV & Min w/FA-DHA (PRENATAL ADULT GUMMY/DHA/FA) 0.4-25 MG CHEW Chew 1 tablet by mouth daily. 10/31/17   Freddrick March, MD  verapamil (CALAN-SR) 120 MG CR tablet Take 1 tablet (  120 mg total) by mouth at bedtime. 08/15/17   [name], [place], DO    Family History Family History  Problem Relation Age of Onset  . Hypertension Mother   . Heart attack Mother   . Drug abuse Father   . High Cholesterol Brother   . Diabetes Paternal Grandmother   . High blood pressure Paternal Grandmother   . Heart disease Paternal Grandmother   . Heart disease Maternal Uncle     Social History Social History   Tobacco Use  . Smoking status: Current Every Day Smoker    Packs/day: 0.50    Years: 3.00    Pack years: 1.50    Types: Cigarettes  . Smokeless tobacco: Never Used  . Tobacco comment: working on quitting, at 7/day now - 10/31/17  Substance Use Topics  . Alcohol use: No    Comment: occ  . Drug use: No     Allergies   Patient has no known  allergies.   Review of Systems Review of Systems   Physical Exam Triage Vital Signs ED Triage Vitals  Enc Vitals Group     BP 11/12/17 1252 134/84     Pulse Rate 11/12/17 1252 76     Resp 11/12/17 1252 20     Temp 11/12/17 1252 97.8 F (36.6 C)     Temp Source 11/12/17 1252 Oral     SpO2 11/12/17 1252 98 %     Weight --      Height --      Head Circumference --      Peak Flow --      Pain Score 11/12/17 1250 0     Pain Loc --      Pain Edu? --      Excl. in GC? --    No data found.  Updated Vital Signs BP 134/84 (BP Location: Left Arm) Comment: large cuff  Pulse 76   Temp 97.8 F (36.6 C) (Oral)   Resp 20   LMP 09/25/2017   SpO2 98%    Physical Exam  Constitutional: She is oriented to person, place, and time. She appears well-developed and well-nourished. No distress.  Cardiovascular: Normal rate, regular rhythm and normal heart sounds.  Pulmonary/Chest: Effort normal and breath sounds normal.  Abdominal: Soft. There is no tenderness.  Neurological: She is alert and oriented to person, place, and time.  Skin: Skin is warm and dry.     UC Treatments / Results  Labs (all labs ordered are listed, but only abnormal results are displayed) Labs Reviewed  POCT URINALYSIS DIP (DEVICE)    EKG None  Radiology No results found.  Procedures Procedures (including critical care time)  Medications Ordered in UC Medications - No data to display  Initial Impression / Assessment and Plan / UC Course  I have reviewed the triage vital signs and the nursing notes.  Pertinent labs & imaging results that were available during my care of the patient were reviewed by me and considered in my medical decision making (see chart for details).     Non toxic. BP WNL. Urine normal.  Still tolerating PO intake. Recent stop of medications during early pregnancy with some morning sickness. Likely contributing to symptoms. Continue to drink plenty of fluids. Has follow up with  OB this week. Return precautions provided.  Final Clinical Impressions(s) / UC Diagnoses   Final diagnoses:  Dizziness     Discharge Instructions     Your blood pressure and heart rate looks well  today. Your Urine is normal.  It seems likely that stopping your medication, nausea and vomiting and early pregnancy are all contributing to your symptoms.  Please continue to follow with OB for persistent symptoms and BP monitoring.  If develop worsening of dizziness, lightheadedness, headache, fevers, swelling, bleeding or otherwise worsening please return to be seen here or in the Er.     ED Prescriptions    None     Controlled Substance Prescriptions [place] Controlled Substance Registry consulted? Not Applicable   Georgetta HaberBurky, Natalie B, NP 11/12/17 1329

## 2017-11-12 NOTE — Discharge Instructions (Addendum)
Your blood pressure and heart rate looks well today. Your Urine is normal.  It seems likely that stopping your medication, nausea and vomiting and early pregnancy are all contributing to your symptoms.  Please continue to follow with OB for persistent symptoms and BP monitoring.  If develop worsening of dizziness, lightheadedness, headache, fevers, swelling, bleeding or otherwise worsening please return to be seen here or in the Er.

## 2017-11-15 NOTE — Progress Notes (Signed)
Journee Rosalita LevanLeighanne Voris is a 27 y.o. yo G2P1001 at 579w2d who presents for her initial prenatal visit. Pregnancy is not planned but desired Denies any leakage of fluid or blood. Denies any pain.  She reports breast tenderness, fatigue, frequent urination, morning sickness and positive home pregnancy test. She  is taking PNV. See flow sheet for details.  PMH, POBH, FH, meds, allergies and Social Hx reviewed. Patient was previously on lamictal for bipolar disorder but discontinued this medication when she found out she was pregnant. Patient was on verapamil for headaches but discontinued this medication when she found out she was pregnant.   Patient is very sure of her LMP as she has regular periods where she tracks on a cell phone app and does not use oral contraceptives.   Patient was seen at MAU this week for pelvic pain and has a follow up US scheduled this upcoming Friday.  Patient currently smoking 4 cigarettes a day, but states today she will cut down to 1 a day and then soon 0 a day. She is determined to not smoke during pregnancy as she did not smoke during her previous pregnancy.   Prenatal Exam: Gen: Well nourished, well developed.  No distress.  Vitals noted. HEENT: Normocephalic, atraumatic.  Neck supple without cervical lymphadenopathy, thyromegaly or thyroid nodules.  Fair dentition. CV: RRR no murmur, gallops or rubs Lungs: CTAB.  Normal respiratory effort without wheezes or rales. Abd: soft, NTND. +BS.  Uterus not appreciated above pelvis. GU: Normal external female genitalia without lesions.  Normal vaginal, well rugated without lesions. No vaginal discharge.  Bimanual exam: No adnexal mass or TTP. No CMT.  Uterus gravid Ext: No clubbing, cyanosis or edema. Psych: Normal grooming and dress.  Not depressed or anxious appearing.  Normal thought content and process without flight of ideas or looseness of associations.  Assessment & Plan: 1) 27 y.o. yo G2P1001 at 169w2d via  LMP doing well.  Current pregnancy issues include nausea. Dating is reliable. Prenatal labs reviewed. Will obtain PNV today  Genetic screening offered: patient would like quad screening but is too early in pregnancy at this time  Early glucola is indicated. Given time of visit, lab was unavailable. Will plan on obtaining 1hr glucola at next PNV.  PHQ-9 and Pregnancy Medical Home forms completed and reviewed. Medical records show a possible history of HTN. Given this history will send referral to high risk OB clinic.  Patient considering tubal ligation. Discussed signing release form >30 days prior to delivery.  Patient states she does not have food insecurity at this time but is scared for concerns during pregnancy. [facility] and patient states she would reach out if interested.  Interested in [place] screen, will obtain at 15-20wks.  Pap smear obtained today.  Given history of bipolar disorder will refer to psychiatry for management during pregnancy. Behavioral health resources given to patient to follow up in 1-2 weeks.  Interested in Systems developerwaterbirth. Will need to attend waterbirth classes and have midwife visit at 36wks.   Information for childbirth classes and WIC information given to patient. [facility]   Bleeding and pain precautions reviewed. Importance of prenatal vitamins reviewed.  Follow up in 4 weeks.

## 2017-11-16 ENCOUNTER — Ambulatory Visit: Payer: Medicaid Other | Admitting: Family Medicine

## 2017-11-16 ENCOUNTER — Other Ambulatory Visit (HOSPITAL_COMMUNITY)
Admission: RE | Admit: 2017-11-16 | Discharge: 2017-11-16 | Disposition: A | Discharge status: Home / Self Care | Payer: Medicaid Other | Source: Ambulatory Visit | Attending: Family Medicine | Admitting: Family Medicine

## 2017-11-16 ENCOUNTER — Other Ambulatory Visit: Payer: Self-pay

## 2017-11-16 ENCOUNTER — Encounter: Payer: Self-pay | Admitting: Family Medicine

## 2017-11-16 VITALS — BP 112/68 | HR 80 | Temp 98.7°F | Wt 305.2 lb

## 2017-11-16 DIAGNOSIS — F319 Bipolar disorder, unspecified: Secondary | ICD-10-CM

## 2017-11-16 DIAGNOSIS — Z3401 Encounter for supervision of normal first pregnancy, first trimester: Secondary | ICD-10-CM | POA: Diagnosis not present

## 2017-11-16 DIAGNOSIS — Z3A01 Less than 8 weeks gestation of pregnancy: Secondary | ICD-10-CM

## 2017-11-16 DIAGNOSIS — Z124 Encounter for screening for malignant neoplasm of cervix: Secondary | ICD-10-CM | POA: Insufficient documentation

## 2017-11-16 NOTE — Patient Instructions (Addendum)
First Trimester of Pregnancy The first trimester of pregnancy is from week 1 until the end of week 13 (months 1 through 3). During this time, your baby will begin to develop inside you. At 6-8 weeks, the eyes and face are formed, and the heartbeat can be seen on ultrasound. At the end of 12 weeks, all the baby's organs are formed. Prenatal care is all the medical care you receive before the birth of your baby. Make sure you get good prenatal care and follow all of your doctor's instructions. Follow these instructions at home: Medicines  Take over-the-counter and prescription medicines only as told by your doctor. Some medicines are safe and some medicines are not safe during pregnancy.  Take a prenatal vitamin that contains at least 600 micrograms (mcg) of folic acid.  If you have trouble pooping (constipation), take medicine that will make your stool soft (stool softener) if your doctor approves. Eating and drinking  Eat regular, healthy meals.  Your doctor will tell you the amount of weight gain that is right for you.  Avoid raw meat and uncooked cheese.  If you feel sick to your stomach (nauseous) or throw up (vomit): ? Eat 4 or 5 small meals a day instead of 3 large meals. ? Try eating a few soda crackers. ? Drink liquids between meals instead of during meals.  To prevent constipation: ? Eat foods that are high in fiber, like fresh fruits and vegetables, whole grains, and beans. ? Drink enough fluids to keep your pee (urine) clear or pale yellow. Activity  Exercise only as told by your doctor. Stop exercising if you have cramps or pain in your lower belly (abdomen) or low back.  Do not exercise if it is too hot, too humid, or if you are in a place of great height (high altitude).  Try to avoid standing for long periods of time. Move your legs often if you must stand in one place for a long time.  Avoid heavy lifting.  Wear low-heeled shoes. Sit and stand up straight.  You  can have sex unless your doctor tells you not to. Relieving pain and discomfort  Wear a good support bra if your breasts are sore.  Take warm water baths (sitz baths) to soothe pain or discomfort caused by hemorrhoids. Use hemorrhoid cream if your doctor says it is okay.  Rest with your legs raised if you have leg cramps or low back pain.  If you have puffy, bulging veins (varicose veins) in your legs: ? Wear support hose or compression stockings as told by your doctor. ? Raise (elevate) your feet for 15 minutes, 3-4 times a day. ? Limit salt in your food. Prenatal care  Schedule your prenatal visits by the twelfth week of pregnancy.  Write down your questions. Take them to your prenatal visits.  Keep all your prenatal visits as told by your doctor. This is important. Safety  Wear your seat belt at all times when driving.  Make a list of emergency phone numbers. The list should include numbers for family, friends, the hospital, and police and fire departments. General instructions  Ask your doctor for a referral to a local prenatal class. Begin classes no later than at the start of month 6 of your pregnancy.  Ask for help if you need counseling or if you need help with nutrition. Your doctor can give you advice or tell you where to go for help.  Do not use hot tubs, steam rooms, or   saunas.  Do not douche or use tampons or scented sanitary pads.  Do not cross your legs for long periods of time.  Avoid all herbs and alcohol. Avoid drugs that are not approved by your doctor.  Do not use any tobacco products, including cigarettes, chewing tobacco, and electronic cigarettes. If you need help quitting, ask your doctor. You may get counseling or other support to help you quit.  Avoid cat litter boxes and soil used by cats. These carry germs that can cause birth defects in the baby and can cause a loss of your baby (miscarriage) or stillbirth.  Visit your dentist. At home, brush  your teeth with a soft toothbrush. Be gentle when you floss. Contact a doctor if:  You are dizzy.  You have mild cramps or pressure in your lower belly.  You have a nagging pain in your belly area.  You continue to feel sick to your stomach, you throw up, or you have watery poop (diarrhea).  You have a bad smelling fluid coming from your vagina.  You have pain when you pee (urinate).  You have increased puffiness (swelling) in your face, hands, legs, or ankles. Get help right away if:  You have a fever.  You are leaking fluid from your vagina.  You have spotting or bleeding from your vagina.  You have very bad belly cramping or pain.  You gain or lose weight rapidly.  You throw up blood. It may look like coffee grounds.  You are around people who have [place] measles, fifth disease, or chickenpox.  You have a very bad headache.  You have shortness of breath.  You have any kind of trauma, such as from a fall or a car accident. Summary  The first trimester of pregnancy is from week 1 until the end of week 13 (months 1 through 3).  To take care of yourself and your unborn baby, you will need to eat healthy meals, take medicines only if your doctor tells you to do so, and do activities that are safe for you and your baby.  Keep all follow-up visits as told by your doctor. This is important as your doctor will have to ensure that your baby is healthy and growing well. This information is not intended to replace advice given to you by your health care provider. Make sure you discuss any questions you have with your health care provider. Document Released: 09/08/2007 Document Revised: 03/30/2016 Document Reviewed: 03/30/2016 Elsevier Interactive Patient Education  [employer]2017 Elsevier Inc.   [facility] phone number: [phone]  Childbirth classes: [phone], [phone], [email or web address]

## 2017-11-17 ENCOUNTER — Encounter: Payer: Self-pay | Admitting: Family Medicine

## 2017-11-17 ENCOUNTER — Ambulatory Visit: Payer: Medicaid Other | Admitting: Family Medicine

## 2017-11-17 ENCOUNTER — Ambulatory Visit: Payer: Medicaid Other

## 2017-11-17 LAB — CYTOLOGY - PAP: Diagnosis: NEGATIVE

## 2017-11-18 ENCOUNTER — Encounter: Payer: Self-pay | Admitting: Family Medicine

## 2017-11-18 ENCOUNTER — Ambulatory Visit (HOSPITAL_COMMUNITY)
Admission: RE | Admit: 2017-11-18 | Discharge: 2017-11-18 | Disposition: A | Discharge status: Home / Self Care | Payer: Medicaid Other | Source: Ambulatory Visit | Attending: Obstetrics and Gynecology | Admitting: Obstetrics and Gynecology

## 2017-11-18 ENCOUNTER — Telehealth: Payer: Self-pay | Admitting: *Deleted

## 2017-11-18 ENCOUNTER — Ambulatory Visit (INDEPENDENT_AMBULATORY_CARE_PROVIDER_SITE_OTHER): Payer: Medicaid Other | Admitting: *Deleted

## 2017-11-18 DIAGNOSIS — O3680X0 Pregnancy with inconclusive fetal viability, not applicable or unspecified: Secondary | ICD-10-CM

## 2017-11-18 DIAGNOSIS — Z3A01 Less than 8 weeks gestation of pregnancy: Secondary | ICD-10-CM | POA: Insufficient documentation

## 2017-11-18 DIAGNOSIS — Z712 Person consulting for explanation of examination or test findings: Secondary | ICD-10-CM

## 2017-11-18 DIAGNOSIS — O283 Abnormal ultrasonic finding on antenatal screening of mother: Secondary | ICD-10-CM

## 2017-11-18 LAB — OBSTETRIC PANEL, INCLUDING HIV
Antibody Screen: NEGATIVE
BASOS: 0 %
Basophils Absolute: 0 10*3/uL (ref 0.0–0.2)
EOS (ABSOLUTE): 0.1 10*3/uL (ref 0.0–0.4)
EOS: 1 %
HEMATOCRIT: 43.5 % (ref 34.0–46.6)
HEP B S AG: NEGATIVE
HIV SCREEN 4TH GENERATION: NONREACTIVE
Hemoglobin: 16 g/dL — ABNORMAL HIGH (ref 11.1–15.9)
IMMATURE GRANULOCYTES: 0 %
Immature Grans (Abs): 0 10*3/uL (ref 0.0–0.1)
LYMPHS ABS: 2.1 10*3/uL (ref 0.7–3.1)
Lymphs: 22 %
MCH: 34.3 pg — ABNORMAL HIGH (ref 26.6–33.0)
MCHC: 36.8 g/dL — AB (ref 31.5–35.7)
MCV: 93 fL (ref 79–97)
MONOS ABS: 0.8 10*3/uL (ref 0.1–0.9)
Monocytes: 8 %
NEUTROS ABS: 6.7 10*3/uL (ref 1.4–7.0)
NEUTROS PCT: 69 %
Platelets: 248 10*3/uL (ref 150–450)
RBC: 4.67 x10E6/uL (ref 3.77–5.28)
RDW: 12.6 % (ref 12.3–15.4)
RH TYPE: POSITIVE
RPR: NONREACTIVE
RUBELLA: 6.53 {index} (ref >0.99)
WBC: 9.7 10*3/uL (ref 3.4–10.8)

## 2017-11-18 LAB — SICKLE CELL SCREEN: Sickle Cell Screen: NEGATIVE

## 2017-11-18 NOTE — Telephone Encounter (Signed)
LMOVM informing pt of normal results. Deseree Blount, CMA  

## 2017-11-18 NOTE — Telephone Encounter (Signed)
LMOVM informing pt of normal test results. Kylie Bailey, CMA

## 2017-11-18 NOTE — Progress Notes (Signed)
Ultrasound results reviewed by Dr. Alysia PennaErvin. Pt advised of results showing normal progression of pregnancy and normal FHR. Pt voiced understanding of information given. She has scheduled prenatal care @ [facility].

## 2017-11-18 NOTE — Telephone Encounter (Signed)
-----   Message from Oralia ManisSherin Abraham, DO sent at 11/18/2017 10:06 AM EDT ----- Please inform patient that PAP smear was normal and her sickle cell screen was negative.

## 2017-11-19 LAB — CULTURE, OB URINE

## 2017-11-19 LAB — URINE CULTURE, OB REFLEX

## 2017-11-21 ENCOUNTER — Telehealth: Payer: Self-pay | Admitting: General Practice

## 2017-11-21 NOTE — Telephone Encounter (Signed)
Patient aware of New OB appt on 12/22/17 at 1:30pm.

## 2017-11-21 NOTE — Progress Notes (Signed)
Agree with A & P. 

## 2017-12-14 ENCOUNTER — Other Ambulatory Visit (HOSPITAL_COMMUNITY)
Admission: RE | Admit: 2017-12-14 | Discharge: 2017-12-14 | Disposition: A | Discharge status: Home / Self Care | Payer: Medicaid Other | Source: Ambulatory Visit | Attending: Obstetrics and Gynecology | Admitting: Obstetrics and Gynecology

## 2017-12-14 ENCOUNTER — Ambulatory Visit: Payer: Medicaid Other | Admitting: *Deleted

## 2017-12-14 ENCOUNTER — Inpatient Hospital Stay (HOSPITAL_COMMUNITY)
Admission: AD | Admit: 2017-12-14 | Discharge: 2017-12-14 | Disposition: A | Discharge status: Home / Self Care | Payer: Medicaid Other | Source: Ambulatory Visit | Attending: Obstetrics & Gynecology | Admitting: Obstetrics & Gynecology

## 2017-12-14 ENCOUNTER — Encounter: Payer: Self-pay | Admitting: General Practice

## 2017-12-14 ENCOUNTER — Other Ambulatory Visit: Payer: Self-pay

## 2017-12-14 ENCOUNTER — Encounter (HOSPITAL_COMMUNITY): Payer: Self-pay

## 2017-12-14 DIAGNOSIS — O161 Unspecified maternal hypertension, first trimester: Secondary | ICD-10-CM | POA: Insufficient documentation

## 2017-12-14 DIAGNOSIS — F17210 Nicotine dependence, cigarettes, uncomplicated: Secondary | ICD-10-CM | POA: Insufficient documentation

## 2017-12-14 DIAGNOSIS — J40 Bronchitis, not specified as acute or chronic: Secondary | ICD-10-CM

## 2017-12-14 DIAGNOSIS — O99331 Smoking (tobacco) complicating pregnancy, first trimester: Secondary | ICD-10-CM | POA: Insufficient documentation

## 2017-12-14 DIAGNOSIS — F329 Major depressive disorder, single episode, unspecified: Secondary | ICD-10-CM | POA: Diagnosis not present

## 2017-12-14 DIAGNOSIS — O99511 Diseases of the respiratory system complicating pregnancy, first trimester: Secondary | ICD-10-CM | POA: Diagnosis not present

## 2017-12-14 DIAGNOSIS — Z3A11 11 weeks gestation of pregnancy: Secondary | ICD-10-CM | POA: Insufficient documentation

## 2017-12-14 DIAGNOSIS — H9203 Otalgia, bilateral: Secondary | ICD-10-CM | POA: Diagnosis not present

## 2017-12-14 DIAGNOSIS — K219 Gastro-esophageal reflux disease without esophagitis: Secondary | ICD-10-CM | POA: Insufficient documentation

## 2017-12-14 DIAGNOSIS — O99341 Other mental disorders complicating pregnancy, first trimester: Secondary | ICD-10-CM | POA: Diagnosis not present

## 2017-12-14 DIAGNOSIS — F419 Anxiety disorder, unspecified: Secondary | ICD-10-CM | POA: Insufficient documentation

## 2017-12-14 DIAGNOSIS — J209 Acute bronchitis, unspecified: Secondary | ICD-10-CM | POA: Diagnosis not present

## 2017-12-14 DIAGNOSIS — Z13228 Encounter for screening for other metabolic disorders: Secondary | ICD-10-CM | POA: Diagnosis not present

## 2017-12-14 DIAGNOSIS — Z3480 Encounter for supervision of other normal pregnancy, unspecified trimester: Secondary | ICD-10-CM | POA: Diagnosis not present

## 2017-12-14 DIAGNOSIS — Z79899 Other long term (current) drug therapy: Secondary | ICD-10-CM | POA: Insufficient documentation

## 2017-12-14 DIAGNOSIS — J4521 Mild intermittent asthma with (acute) exacerbation: Secondary | ICD-10-CM | POA: Diagnosis not present

## 2017-12-14 DIAGNOSIS — R0602 Shortness of breath: Secondary | ICD-10-CM | POA: Diagnosis present

## 2017-12-14 DIAGNOSIS — R05 Cough: Secondary | ICD-10-CM | POA: Diagnosis not present

## 2017-12-14 DIAGNOSIS — O99611 Diseases of the digestive system complicating pregnancy, first trimester: Secondary | ICD-10-CM | POA: Diagnosis not present

## 2017-12-14 MED ORDER — ALBUTEROL SULFATE HFA 108 (90 BASE) MCG/ACT IN AERS: 1.0000 | INHALATION_SPRAY | Freq: Four times a day (QID) | RESPIRATORY_TRACT | 0 refills | Status: DC | PRN

## 2017-12-14 MED ORDER — GUAIFENESIN ER 600 MG PO TB12: 600.0000 mg | ORAL_TABLET | Freq: Two times a day (BID) | ORAL | 0 refills | Status: DC

## 2017-12-14 NOTE — MAU Provider Note (Signed)
History   Kylie Bailey is a 27 year old female who is G2P1 and [redacted]w[redacted]d gestation. She has a chief complaint of SOB and productive cough for one month. She also came today from her intake visit at her OB, at this visit they were unable to find a fetal heart rate with doppler. She states she initially thought she had caught something from her daughter when she was sick 2 months ago. She states that the coughing becomes severe and often causes her to vomit. Patient reports yellow phlegm. Walking, going up and down stairs exacerbate the SOB and she feels like she "cannot catch her breath. Patient reports that hot ginger tea improves her cough. She also reports chills, chest tightness, congestion, bilateral ear pain. She states that "I am allergic to everything". She reports a history of childhood asthma but reports no adult exacerbations. She has an inhaler but was afraid to use it during her pregnancy. She denies chest pain, fever, lower extremity edema.  CSN: [id number]  Arrival date and time: 12/14/17 1036   None     Chief Complaint  Patient presents with  . Shortness of Breath  . no FH  . Hypertension     Past Medical History:  Diagnosis Date  . Anxiety   . Bulimia   . Depression   . GERD (gastroesophageal reflux disease)   . Hypertension   . Medical history non-contributory   . Migraines     Past Surgical History:  Procedure Laterality Date  . TONSILLECTOMY    . TYMPANOSTOMY TUBE PLACEMENT Bilateral     Family History  Problem Relation Age of Onset  . Hypertension Mother   . Heart attack Mother   . Drug abuse Father   . High Cholesterol Brother   . Diabetes Paternal Grandmother   . High blood pressure Paternal Grandmother   . Heart disease Paternal Grandmother   . Heart disease Maternal Uncle     Social History   Tobacco Use  . Smoking status: Current Every Day Smoker    Packs/day: 0.25    Years: 3.00    Pack years: 0.75    Types: Cigarettes  . Smokeless  tobacco: Never Used  . Tobacco comment: working on quitting, at 4/day now - 11/16/17. Trying to quit, 1 cig/day  Substance Use Topics  . Alcohol use: No    Comment: occ  . Drug use: No    Allergies: No Known Allergies  Medications Prior to Admission  Medication Sig Dispense Refill Last Dose  . acetaminophen (TYLENOL) 325 MG tablet Take 650 mg by mouth every 6 (six) hours as needed for moderate pain.   12/13/2017 at Unknown time  . calcium carbonate (TUMS - DOSED IN MG ELEMENTAL CALCIUM) 500 MG chewable tablet Chew 2 tablets by mouth 2 (two) times daily as needed for indigestion or heartburn.   unk at prn  . Prenatal MV & Min w/FA-DHA (PRENATAL ADULT GUMMY/DHA/FA) 0.4-25 MG CHEW Chew 1 tablet by mouth daily. 90 tablet 3 12/13/2017 at Unknown time  . albuterol (PROVENTIL HFA;VENTOLIN HFA) 108 (90 Base) MCG/ACT inhaler Inhale 2 puffs into the lungs every 4 (four) hours as needed for wheezing or shortness of breath. (Patient not taking: Reported on 11/01/2017) 1 Inhaler 0 Not Taking at Unknown time  . fluticasone (FLONASE) 50 MCG/ACT nasal spray Place 1 spray into both nostrils 2 (two) times daily. (Patient not taking: Reported on 11/01/2017) 15.8 g 4 Not Taking at Unknown time    Review of Systems  Constitutional: Positive for chills.  HENT: Positive for congestion, ear pain, hearing loss, postnasal drip and rhinorrhea (Which she describes as nasal congestion). Negative for sore throat. Nosebleeds: She describes it as congestion, with mucus.   Respiratory: Positive for cough, chest tightness and shortness of breath.   Cardiovascular: Positive for palpitations. Negative for chest pain and leg swelling.  Genitourinary: Negative for vaginal bleeding and vaginal discharge.   Physical Exam   Blood pressure (!) 144/84, pulse 91, temperature 98.3 F (36.8 C), temperature source Oral, resp. rate 18, weight (!) 138.8 kg, last menstrual period 09/25/2017, SpO2 100 %.  Physical Exam  Constitutional: She  appears well-developed and well-nourished.  Ears: EAC with small amounts of cerumen bilaterally. TMs are clear with bony landmarks visible and some scaring from tympanostomy tubes. Negative tragus sign and non tender mastoid. Nose: Nares are patent, swollen erythematous turbinates bilaterally.  Throat: no tonsil enlargement, oropharynx without erythema, uvula midline, symmetric rise of soft pallet. Neck: No post auricular, occipital, tonsillar, anterior cervical, or posterior cervical lymphadenopathy.  Cardiac: RRR, no murmurs or rubs Lungs: Faint wheezes auscultated, cleared by coughing. No dullness to percussion in any lung fields.   MAU Course  Procedures  MDM Bedside ultrasound revealed fetal movement and heartbeat suggesting spontaneous abortion and ectopic pregnancy are less likely. Patient had no fever currently and oxygen saturation does not suggest hypoxia making pneumonia less likely.   Assessment and Plan  [redacted] weeks gestation of pregnancy - Plan: Discharge patient  Supervision of other normal pregnancy, antepartum  Bronchitis - Plan: Discharge patient  Mild intermittent asthma with exacerbation - Plan: Discharge patient  Acute bronchitis, unspecified organism - Plan: albuterol (PROVENTIL HFA;VENTOLIN HFA) 108 (90 Base) MCG/ACT inhaler  A: She is a 27 year old afebrile female presenting with chief complaint of SOB, productive cough, and no fetal heart beat via doppler. Bedside ultrasound revealed fetal movement and heart beat.  P: Patient was diagnosed with asthma exacerbation and bronchitis due to history and physical exam. Guanfacine and Albuterol inhaler were prescribed. She has an appointment with her OB on 12/22/17. She was instructed to follow-up with them if her symptoms improve or do not worsen. She was instructed to go to her provider early if she has worsening SOB and/or productive cough.   Charyl Dancer 12/14/2017, 1:12 PM

## 2017-12-14 NOTE — Patient Instructions (Signed)
   Genetic Screening Results Information: You are having genetic testing called [facility] today.  It will take approximately 2 weeks before the results are available.  To get your results, you need Internet access to a web browser to search [place]/MyChart (the direct app on your phone will not give you these results).  Then select Lab Scanned and click on the blue hyper link that says View Image to see your [facility] results.  You can also use the directions on the purple card given to look up your results directly on the [place] website.  Advised patient to go to MAU for shortness or breath and increase heart. Advised on return precautions and when to contact clinic. Advised on irregular bleeding and severe cramping.

## 2017-12-14 NOTE — MAU Note (Signed)
Urine in lab 

## 2017-12-14 NOTE — MAU Provider Note (Signed)
History     CSN: [id number]  Arrival date and time: 12/14/17 1036   First Provider Initiated Contact with Patient 12/14/17 1314      Chief Complaint  Patient presents with  . Shortness of Breath  . no FH  . Hypertension   G2P1001 @11 .3 wks here with SOB and productive cough. Sx started about 1 mo ago. Reports he daughter was sick before she had sx. At times the cough causes her to vomit. Reports phlegm is yellow. SOB is caused by walking and going up and down stairs. She used ginger tea which helped some. Associated sx are chills, chest tightness, congestion, and bilateral ear pain. She has hx of asthma but didn't use her inhaler for fear of safety during pregnancy. She as seen today for her intake appt and was unable to find FHT. No VB or abd pain.     OB History    Gravida  2   Para  1   Term  1   Preterm      AB      Living  1     SAB      TAB      Ectopic      Multiple      Live Births  1           Past Medical History:  Diagnosis Date  . Anxiety   . Bulimia   . Depression   . GERD (gastroesophageal reflux disease)   . Hypertension   . Medical history non-contributory   . Migraines     Past Surgical History:  Procedure Laterality Date  . TONSILLECTOMY    . TYMPANOSTOMY TUBE PLACEMENT Bilateral     Family History  Problem Relation Age of Onset  . Hypertension Mother   . Heart attack Mother   . Drug abuse Father   . High Cholesterol Brother   . Diabetes Paternal Grandmother   . High blood pressure Paternal Grandmother   . Heart disease Paternal Grandmother   . Heart disease Maternal Uncle     Social History   Tobacco Use  . Smoking status: Current Every Day Smoker    Packs/day: 0.25    Years: 3.00    Pack years: 0.75    Types: Cigarettes  . Smokeless tobacco: Never Used  . Tobacco comment: working on quitting, at 4/day now - 11/16/17. Trying to quit, 1 cig/day  Substance Use Topics  . Alcohol use: No    Comment: occ  . Drug  use: No    Allergies: No Known Allergies  No medications prior to admission.    Review of Systems  Constitutional: Positive for chills. Negative for fever.  HENT: Positive for congestion, ear pain and rhinorrhea. Negative for sore throat.   Respiratory: Positive for cough, chest tightness and shortness of breath.   Cardiovascular: Negative for chest pain and leg swelling.  Gastrointestinal: Negative for abdominal pain.  Genitourinary: Negative for vaginal bleeding.   Physical Exam   Blood pressure 120/70, pulse 91, temperature 98.3 F (36.8 C), temperature source Oral, resp. rate 18, weight (!) 138.8 kg, last menstrual period 09/25/2017, SpO2 100 %.  Physical Exam  Constitutional: She appears well-developed and well-nourished. No distress.  HENT:  Head: Normocephalic and atraumatic.  Right Ear: Hearing, external ear and ear canal normal. Tympanic membrane is scarred.  Left Ear: Ear canal normal. Tympanic membrane is scarred.  Nose: No mucosal edema (erythema).  Mouth/Throat: Uvula is midline, oropharynx is clear and moist  and mucous membranes are normal.  Eyes: Pupils are equal, round, and reactive to light.  Neck: Normal range of motion.  Cardiovascular: Normal rate, regular rhythm and normal heart sounds.  Respiratory: Effort normal. No respiratory distress. She has wheezes (present but cleared by cough). She has no rales.   Limited bedside US: viable, active fetus, +cardiac activity, subj. nml AFV  MAU Course  Procedures  MDM Will treat for asthma exacerbation and bronchitis. Pt reassured regarding fetal status. Stable for discharge home.   Assessment and Plan   1. [redacted] weeks gestation of pregnancy   2. Supervision of other normal pregnancy, antepartum   3. Bronchitis   4. Mild intermittent asthma with exacerbation   5. Acute bronchitis, unspecified organism    Discharge home Follow up in OB office as scheduled Notify MD for worsening sx Rx Mucinex Zyrtec Rx  Proventil inhaler  Allergies as of 12/14/2017   No Known Allergies     Medication List    TAKE these medications   acetaminophen 325 MG tablet Commonly known as:  TYLENOL Take 650 mg by mouth every 6 (six) hours as needed for moderate pain.   albuterol 108 (90 Base) MCG/ACT inhaler Commonly known as:  PROVENTIL HFA;VENTOLIN HFA Inhale 1-2 puffs into the lungs every 6 (six) hours as needed for wheezing or shortness of breath. What changed:    how much to take  when to take this   calcium carbonate 500 MG chewable tablet Commonly known as:  TUMS - dosed in mg elemental calcium Chew 2 tablets by mouth 2 (two) times daily as needed for indigestion or heartburn.   fluticasone 50 MCG/ACT nasal spray Commonly known as:  FLONASE Place 1 spray into both nostrils 2 (two) times daily.   guaiFENesin 600 MG 12 hr tablet Commonly known as:  MUCINEX Take 1 tablet (600 mg total) by mouth 2 (two) times daily.   Prenatal Adult Gummy/DHA/FA 0.4-25 MG Chew Chew 1 tablet by mouth daily.       Donette Larry 12/14/2017, 3:54 PM

## 2017-12-14 NOTE — Discharge Instructions (Signed)

## 2017-12-14 NOTE — MAU Note (Addendum)
Went and saw her dr, told her she should get checked out because she has been SOB- though not a current issue (reports SOB for about a month, worse with activity- denies prior hx), BP is increasing (hx of hypertension never on meds); and they were unable to find a heartbeat.

## 2017-12-14 NOTE — Progress Notes (Signed)
   PRENATAL INTAKE SUMMARY  Kylie Bailey presents today New OB Nurse Interview.  OB History    Gravida  2   Para  1   Term  1   Preterm      AB      Living  1     SAB      TAB      Ectopic      Multiple      Live Births  1          I have reviewed the patient's medical, obstetrical, social, and family histories, medications, and available lab results.  SUBJECTIVE She complains of vaginal itching and irration. Shortness of breathe and increase heart rate with walking short distances.  OBJECTIVE Initial nurse interview for history and lab work (New OB). Pt transferred [facility] from [facility].  GENERAL APPEARANCE: alert, well appearing  ASSESSMENT Normal pregnancy.  EDD: 07/02/2018; GA: [redacted]w[redacted]d; G2P1. Pt denied any chest pain, SOB, n/v, dizziness or visual changes.  Denies vaginal bleeding, contractions. Unable to assess fetal heart tones due to body habitus.  PLAN Prenatal care:  [facility]. OB Pnl/HIV: Completed at [facility] 11/16/17 OB Urine Culture: Completed at [facility] 11/16/17 GC/CT: Pending HgbEval: Pending QGB:EEFEOFH CF: Pending A1C: Pending  Patient advised to go to MAU or contact her PCP for the SOB and increase heart. Nurse called [facility], but they did not have any appointments to see patient today. Pt advised on when to contact clinic or go to MAU for any vaginal bleeding, severe cramping/pain. Preterm labor precautions given.  Genetic Screening Results Information: You are having genetic testing called [facility] today.  It will take approximately 2 weeks before the results are available.  To get your results, you need Internet access to a web browser to search /MyChart (the direct app on your phone will not give you these results).  Then select Lab Scanned and click on the blue hyper link that says View Image to see your [facility] results.  You can also use the directions on the purple card given to look up your results directly on the  [place] website.  Clovis Pu, RN

## 2017-12-16 LAB — HEMOGLOBINOPATHY EVALUATION
FERRITIN: 151 ng/mL — AB (ref 15–150)
HGB F QUANT: 0 % (ref 0.0–2.0)
HGB SOLUBILITY: NEGATIVE
Hematocrit: 42 % (ref 34.0–46.6)
Hemoglobin: 14.3 g/dL (ref 11.1–15.9)
Hgb A2 Quant: 2.3 % (ref 1.8–3.2)
Hgb A: 97.7 % (ref 96.4–98.8)
Hgb C: 0 %
Hgb S: 0 %
Hgb Variant: 0 %
MCH: 32.2 pg (ref 26.6–33.0)
MCHC: 34 g/dL (ref 31.5–35.7)
MCV: 95 fL (ref 79–97)
Platelets: 233 10*3/uL (ref 150–450)
RBC: 4.44 x10E6/uL (ref 3.77–5.28)
RDW: 13.1 % (ref 12.3–15.4)
WBC: 8 10*3/uL (ref 3.4–10.8)

## 2017-12-16 LAB — CERVICOVAGINAL ANCILLARY ONLY
Bacterial vaginitis: NEGATIVE
CHLAMYDIA, DNA PROBE: NEGATIVE
Candida vaginitis: NEGATIVE
NEISSERIA GONORRHEA: NEGATIVE
TRICH (WINDOWPATH): NEGATIVE

## 2017-12-16 LAB — HEMOGLOBIN A1C
Est. average glucose Bld gHb Est-mCnc: 91 mg/dL
Hgb A1c MFr Bld: 4.8 % (ref 4.8–5.6)

## 2017-12-22 ENCOUNTER — Encounter: Payer: Self-pay | Admitting: General Practice

## 2017-12-22 ENCOUNTER — Ambulatory Visit (INDEPENDENT_AMBULATORY_CARE_PROVIDER_SITE_OTHER): Payer: Medicaid Other | Admitting: Obstetrics and Gynecology

## 2017-12-22 DIAGNOSIS — O10919 Unspecified pre-existing hypertension complicating pregnancy, unspecified trimester: Secondary | ICD-10-CM

## 2017-12-22 DIAGNOSIS — Z3481 Encounter for supervision of other normal pregnancy, first trimester: Secondary | ICD-10-CM

## 2017-12-22 DIAGNOSIS — Z3480 Encounter for supervision of other normal pregnancy, unspecified trimester: Secondary | ICD-10-CM

## 2017-12-22 DIAGNOSIS — O10911 Unspecified pre-existing hypertension complicating pregnancy, first trimester: Secondary | ICD-10-CM

## 2017-12-22 NOTE — Progress Notes (Signed)
Subjective:    Kylie Bailey is being seen today for her first obstetrical visit. She is transferring her [facility] to [facility]. This is a planned pregnancy. She is at 6824w4d gestation. Her obstetrical history is significant for obesity, pre-eclampsia and cHTN (no meds). Relationship with FOB (Travanti): spouse, living together. Patient does not intend to breast feed. Pregnancy history fully reviewed.  Patient reports no complaints.  Review of Systems:   Review of Systems  Constitutional: Negative.   HENT: Negative.   Respiratory: Negative.   Cardiovascular: Negative.   Gastrointestinal: Negative.   Endocrine: Negative.   Genitourinary: Negative.   Musculoskeletal: Negative.   Skin: Negative.   Allergic/Immunologic: Negative.   Neurological: Negative.   Hematological: Negative.   Psychiatric/Behavioral: Negative.     Objective:     BP 115/82   Pulse (!) 104   Wt (!) 303 lb 9.6 oz (137.7 kg)   LMP 09/25/2017 (Exact Date)   BMI 44.83 kg/m  Physical Exam  Nursing note and vitals reviewed. Constitutional: She is oriented to person, place, and time. She appears well-developed and well-nourished.  HENT:  Head: Normocephalic and atraumatic.  Right Ear: External ear normal.  Left Ear: External ear normal.  Nose: Nose normal.  Mouth/Throat: Oropharynx is clear and moist.  Eyes: Pupils are equal, round, and reactive to light. Conjunctivae and EOM are normal.  Neck: Normal range of motion. Neck supple.  Cardiovascular: Normal rate, regular rhythm, normal heart sounds and intact distal pulses.  Respiratory: Effort normal and breath sounds normal.  GI: Soft. Bowel sounds are normal.  Genitourinary:  Genitourinary Comments: Uterus: gravid, S=D (difficult to assess d/t body habitus), SE: cervix is smooth, pink, no lesions, small amt of white vaginal d/c, cx: closed/long/firm, no CMT or friability, no adnexal tenderness   Musculoskeletal: Normal range of motion.   Neurological: She is alert and oriented to person, place, and time. She has normal reflexes.  Skin: Skin is warm and dry.  Psychiatric: She has a normal mood and affect. Her behavior is normal. Judgment and thought content normal.    Maternal Exam:  Abdomen: Patient reports no abdominal tenderness. Introitus: Normal vulva. Normal vagina.  Ferning test: not done.  Nitrazine test: not done. Amniotic fluid character: not assessed.  Pelvis: adequate for delivery.   Cervix: Cervix evaluated by sterile speculum exam and digital exam.     Fetal Exam Fetal Monitor Review: Mode: hand-held doppler probe.   Baseline rate: 138 bpm.         Assessment:    Pregnancy: G2P1001 Patient Active Problem List   Diagnosis Date Noted  . Supervision of other normal pregnancy, antepartum 10/31/2017  . Bipolar disorder (HCC) 10/07/2017  . Skin tag 10/07/2017  . Seasonal allergies 08/18/2017  . Essential hypertension 12/03/2016  . Stress 12/11/2015  . Panic attacks 12/11/2015  . Obesity 12/11/2015  . Elevated BP without diagnosis of hypertension 12/11/2015       Plan:     Initial labs reviewed. Prenatal vitamins. Rx for baby ASA sent (patient notified by My Chart message). Problem list reviewed and updated. AFP3 discussed: requested. Role of ultrasound in pregnancy discussed; fetal survey: ordered. Amniocentesis discussed: not indicated. The nature of [place] [facility] with multiple MDs and other Advanced Practice Providers was explained to patient; also emphasized that residents, students are part of our team. Follow up in 4 weeks. 50% of 40 min visit spent on counseling and coordination of care.     Raelyn Moraolitta Jenice Leiner,  MSN, CNM 12/22/2017

## 2017-12-23 ENCOUNTER — Telehealth: Payer: Self-pay | Admitting: *Deleted

## 2017-12-23 ENCOUNTER — Encounter: Payer: Self-pay | Admitting: General Practice

## 2017-12-23 DIAGNOSIS — O10919 Unspecified pre-existing hypertension complicating pregnancy, unspecified trimester: Secondary | ICD-10-CM | POA: Insufficient documentation

## 2017-12-23 MED ORDER — ASPIRIN 81 MG PO CHEW: 81.0000 mg | CHEWABLE_TABLET | Freq: Every day | ORAL | 6 refills | Status: DC

## 2017-12-23 NOTE — Telephone Encounter (Signed)
-----   Message from Raelyn Moraolitta Dawson, [place] sent at 12/23/2017  8:22 AM EDT ----- Regarding: Please call Patient Please call this patient to inform her that I have sent a Rx for baby aspirin. She should take it daily. It is to protect her pregnancy from the possibility of developing PEC again.

## 2017-12-23 NOTE — Telephone Encounter (Signed)
Left voice message for patient that CNM sent in a Rx for baby aspirin 81 mg once daily. Pt to call clinic with any questions.  Clovis PuMartin, Tamika L, RN

## 2017-12-23 NOTE — Telephone Encounter (Signed)
-----   Message from Rolitta Dawson, CNM sent at 12/23/2017  8:22 AM EDT ----- Regarding: Please call Patient Please call this patient to inform her that I have sent a Rx for baby aspirin. She should take it daily. It is to protect her pregnancy from the possibility of developing PEC again. 

## 2017-12-26 ENCOUNTER — Encounter: Payer: Medicaid Other | Admitting: Family Medicine

## 2018-01-10 ENCOUNTER — Encounter (HOSPITAL_COMMUNITY): Payer: Self-pay

## 2018-01-10 ENCOUNTER — Inpatient Hospital Stay (HOSPITAL_COMMUNITY)
Admission: AD | Admit: 2018-01-10 | Discharge: 2018-01-10 | Disposition: A | Discharge status: Home / Self Care | Payer: Medicaid Other | Source: Ambulatory Visit | Attending: Obstetrics & Gynecology | Admitting: Obstetrics & Gynecology

## 2018-01-10 DIAGNOSIS — F17210 Nicotine dependence, cigarettes, uncomplicated: Secondary | ICD-10-CM | POA: Diagnosis not present

## 2018-01-10 DIAGNOSIS — O99332 Smoking (tobacco) complicating pregnancy, second trimester: Secondary | ICD-10-CM | POA: Insufficient documentation

## 2018-01-10 DIAGNOSIS — O26892 Other specified pregnancy related conditions, second trimester: Secondary | ICD-10-CM | POA: Diagnosis not present

## 2018-01-10 DIAGNOSIS — Z3A15 15 weeks gestation of pregnancy: Secondary | ICD-10-CM | POA: Insufficient documentation

## 2018-01-10 DIAGNOSIS — K0889 Other specified disorders of teeth and supporting structures: Secondary | ICD-10-CM | POA: Insufficient documentation

## 2018-01-10 DIAGNOSIS — Z7982 Long term (current) use of aspirin: Secondary | ICD-10-CM | POA: Diagnosis not present

## 2018-01-10 DIAGNOSIS — O9989 Other specified diseases and conditions complicating pregnancy, childbirth and the puerperium: Secondary | ICD-10-CM | POA: Diagnosis not present

## 2018-01-10 LAB — URINALYSIS, ROUTINE W REFLEX MICROSCOPIC
Bilirubin Urine: NEGATIVE
GLUCOSE, UA: NEGATIVE mg/dL
HGB URINE DIPSTICK: NEGATIVE
Ketones, ur: NEGATIVE mg/dL
Leukocytes, UA: NEGATIVE
Nitrite: NEGATIVE
PH: 5 (ref 5.0–8.0)
Protein, ur: NEGATIVE mg/dL
Specific Gravity, Urine: 1.014 (ref 1.005–1.030)

## 2018-01-10 MED ORDER — OXYCODONE-ACETAMINOPHEN 5-325 MG PO TABS: 1.0000 | ORAL_TABLET | ORAL | 0 refills | Status: DC | PRN

## 2018-01-10 MED ORDER — AMOXICILLIN-POT CLAVULANATE 875-125 MG PO TABS: 1.0000 | ORAL_TABLET | Freq: Two times a day (BID) | ORAL | 0 refills | Status: DC

## 2018-01-10 NOTE — MAU Note (Signed)
Reports some mouth pain since last night, states part of her tooth chipped off when she tried to clean her gum. Thinks maybe right side of mouth is swollen  States she keeps peeing on herself and is having increased frequency and urgency with urination, no burning  Also reports clear discharge

## 2018-01-10 NOTE — MAU Provider Note (Signed)
Chief Complaint: Dental Pain and Urinary Frequency   First Provider Initiated Contact with Patient 01/10/18 0841     SUBJECTIVE HPI: Kylie Bailey is a 27 y.o. G2P1001 at [redacted]w[redacted]d who presents to Maternity Admissions reporting tooth pain & increased urinary frequency.  States she was brushing her teeth this morning when a piece of her back tooth came out. States she feels a crack in her tooth when she rubs her tongue over it. This happened to her a few years ago with another tooth. Since then has had pain in gums over that tooth & reports gums were bleeding this morning after brushing her teeth. She has a Education officer, community but doesn't want to go back to him b/c he "messed up" last time.  Also reports increased urinary frequency for the last few days. States she's unsure if it is normal for pregnancy or if she has a UTI. Denies dysuria, hematuria, flank pain, abdominal pain.   Location: mouth Quality: throbbing Severity: 8/10 on pain scale Duration: 1 days Timing: constant Modifying factors: worse with eating Associated signs and symptoms: none  Past Medical History:  Diagnosis Date  . Anxiety   . Bulimia   . Depression   . GERD (gastroesophageal reflux disease)   . Hypertension   . Medical history non-contributory   . Migraines    OB History  Gravida Para Term Preterm AB Living  2 1 1     1   SAB TAB Ectopic Multiple Live Births          1    # Outcome Date GA Lbr Len/2nd Weight Sex Delivery Anes PTL Lv  2 Current           1 Term 04/21/13 [redacted]w[redacted]d  3771 g F Vag-Spont EPI N LIV   Past Surgical History:  Procedure Laterality Date  . TONSILLECTOMY    . TYMPANOSTOMY TUBE PLACEMENT Bilateral    Social History   Socioeconomic History  . Marital status: Married    Spouse name: Travanti  . Number of children: 1  . Years of education: [employer]  . Highest education level: 12th grade  Occupational History  . Occupation: Stay Home Mom  Social Needs  . Financial resource strain: Not  very hard  . Food insecurity:    Worry: Sometimes true    Inability: Sometimes true  . Transportation needs:    Medical: Yes    Non-medical: Yes  Tobacco Use  . Smoking status: Current Every Day Smoker    Packs/day: 0.25    Years: 3.00    Pack years: 0.75    Types: Cigarettes  . Smokeless tobacco: Never Used  . Tobacco comment: working on quitting, at 4/day now - 11/16/17. Trying to quit, 1 cig/day  Substance and Sexual Activity  . Alcohol use: No    Comment: occ  . Drug use: No  . Sexual activity: Yes    Birth control/protection: None  Lifestyle  . Physical activity:    Days per week: 0 days    Minutes per session: 0 min  . Stress: Only a little  Relationships  . Social connections:    Talks on phone: More than three times a week    Gets together: More than three times a week    Attends religious service: Never    Active member of club or organization: No    Attends meetings of clubs or organizations: Never    Relationship status: Married  . Intimate partner violence:    Fear  of current or ex partner: No    Emotionally abused: No    Physically abused: No    Forced sexual activity: No  Other Topics Concern  . Not on file  Social History Narrative   Lack of transportation   Family History  Problem Relation Age of Onset  . Hypertension Mother   . Heart attack Mother   . Drug abuse Father   . High Cholesterol Brother   . Diabetes Paternal Grandmother   . High blood pressure Paternal Grandmother   . Heart disease Paternal Grandmother   . Heart disease Maternal Uncle    No current facility-administered medications on file prior to encounter.    Current Outpatient Medications on File Prior to Encounter  Medication Sig Dispense Refill  . acetaminophen (TYLENOL) 325 MG tablet Take 650 mg by mouth every 6 (six) hours as needed for moderate pain.    Marland Kitchen albuterol (PROVENTIL HFA;VENTOLIN HFA) 108 (90 Base) MCG/ACT inhaler Inhale 1-2 puffs into the lungs every 6 (six)  hours as needed for wheezing or shortness of breath. 1 Inhaler 0  . aspirin 81 MG chewable tablet Chew 1 tablet (81 mg total) by mouth daily. 30 tablet 6  . calcium carbonate (TUMS - DOSED IN MG ELEMENTAL CALCIUM) 500 MG chewable tablet Chew 2 tablets by mouth 2 (two) times daily as needed for indigestion or heartburn.    . Prenatal MV & Min w/FA-DHA (PRENATAL ADULT GUMMY/DHA/FA) 0.4-25 MG CHEW Chew 1 tablet by mouth daily. 90 tablet 3   No Known Allergies  I have reviewed patient's Past Medical Hx, Surgical Hx, Family Hx, Social Hx, medications and allergies.   Review of Systems  Constitutional: Negative.   HENT: Positive for dental problem.   Gastrointestinal: Negative.  Negative for abdominal pain.  Genitourinary: Positive for frequency. Negative for dysuria, vaginal bleeding and vaginal discharge.    OBJECTIVE Patient Vitals for the past 24 hrs:  BP Temp Temp src Pulse Resp Weight  01/10/18 1009 120/70 - - 73 18 -  01/10/18 0817 139/81 98.4 F (36.9 C) Oral 93 20 (!) 137.4 kg   Constitutional: Well-developed, well-nourished female in no acute distress.  Cardiovascular: normal rate & rhythm, no murmur Respiratory: normal rate and effort. Lung sounds clear throughout GI: Abd soft, non-tender, Pos BS x 4. No guarding or rebound tenderness MS: Extremities nontender, no edema, normal ROM Neurologic: Alert and oriented x 4.  Mouth: Upper right molar with crack in center. Gums erythematous, no swelling or abscess.   LAB RESULTS Results for orders placed or performed during the hospital encounter of 01/10/18 (from the past 24 hour(s))  Urinalysis, Routine w reflex microscopic     Status: None   Collection Time: 01/10/18  8:11 AM  Result Value Ref Range   Color, Urine YELLOW YELLOW   APPearance CLEAR CLEAR   Specific Gravity, Urine 1.014 1.005 - 1.030   pH 5.0 5.0 - 8.0   Glucose, UA NEGATIVE NEGATIVE mg/dL   Hgb urine dipstick NEGATIVE NEGATIVE   Bilirubin Urine NEGATIVE  NEGATIVE   Ketones, ur NEGATIVE NEGATIVE mg/dL   Protein, ur NEGATIVE NEGATIVE mg/dL   Nitrite NEGATIVE NEGATIVE   Leukocytes, UA NEGATIVE NEGATIVE    MAU COURSE Orders Placed This Encounter  Procedures  . Urinalysis, Routine w reflex microscopic  . Discharge patient   Meds ordered this encounter  Medications  . amoxicillin-clavulanate (AUGMENTIN) 875-125 MG tablet    Sig: Take 1 tablet by mouth 2 (two) times daily for 14  days.    Dispense:  28 tablet    Refill:  0    Order Specific Question:   Supervising Provider    Answer:   Jaynie Collins A [[id number]]  . oxyCODONE-acetaminophen (PERCOCET/ROXICET) 5-325 MG tablet    Sig: Take 1 tablet by mouth every 4 (four) hours as needed.    Dispense:  10 tablet    Refill:  0    Order Specific Question:   Supervising Provider    Answer:   Tereso Newcomer [[id number]]    MDM RN unable to doppler FHT, likely d/t body habitus.  Pt informed that the ultrasound is considered a limited OB ultrasound and is not intended to be a complete ultrasound exam.  Patient also informed that the ultrasound is not being completed with the intent of assessing for fetal or placental anomalies or any pelvic abnormalities.  Explained that the purpose of today's ultrasound is to assess for  viability.  Patient acknowledges the purpose of the exam and the limitations of the study.  Active IUP with FHR 140s.  Negative u/a  ASSESSMENT 1. Pain, dental   2. [redacted] weeks gestation of pregnancy     PLAN Discharge home in stable condition. Rx abx & dental letter provided. Pt to f/u with dentist  Allergies as of 01/10/2018   No Known Allergies     Medication List    TAKE these medications   acetaminophen 325 MG tablet Commonly known as:  TYLENOL Take 650 mg by mouth every 6 (six) hours as needed for moderate pain.   albuterol 108 (90 Base) MCG/ACT inhaler Commonly known as:  PROVENTIL HFA;VENTOLIN HFA Inhale 1-2 puffs into the lungs every 6 (six) hours as needed  for wheezing or shortness of breath.   amoxicillin-clavulanate 875-125 MG tablet Commonly known as:  AUGMENTIN Take 1 tablet by mouth 2 (two) times daily for 14 days.   aspirin 81 MG chewable tablet Chew 1 tablet (81 mg total) by mouth daily.   calcium carbonate 500 MG chewable tablet Commonly known as:  TUMS - dosed in mg elemental calcium Chew 2 tablets by mouth 2 (two) times daily as needed for indigestion or heartburn.   oxyCODONE-acetaminophen 5-325 MG tablet Commonly known as:  PERCOCET/ROXICET Take 1 tablet by mouth every 4 (four) hours as needed.   Prenatal Adult Gummy/DHA/FA 0.4-25 MG Chew Chew 1 tablet by mouth daily.        Judeth Horn, NP 01/10/2018  4:22 PM

## 2018-01-10 NOTE — Discharge Instructions (Signed)
    Dental Assistance:  If unable to pay or uninsured, contact: [facility] to become qualified for the adult dental clinic. Patient must be enrolled in [employer] (uninsured, 0-200% FPL, qualifying info).  Enroll in [employer] first, then see Primary Care Physician assigned to you, the PCP makes a dental referral. [facility] will receive referral and contacts patient for appointment.  Patients with Medicaid           [address], [phone]  [facility] (Children up to 20 + Pregnant Women) - [phone]  [facility] - [address] - [address] [phone]  If unable to pay, or uninsured: contact [facility] ([phone] in [place] - (Children only + Pregnant Women), [phone] in [place]- Children only) to become qualified for the adult dental clinic  Must see if eligible to enroll in [employer] Insurance Marketplace before enrolling into the [employer] (exemption required) ([phone] for an appointment)  www.healthcare.gov;   1-800-318-2596.  If not eligible for [employer], then go by Department of Health and Human Services to see if eligible for "orange card."  [address], [place] and [address]- [place].  Once you get an orange card, you will have a Primary Care home who will then refer you to dental if needed.        Other Low-Cost Community Dental Services:   [facility] - [phone] ([phone])   [address] [place] [address]  Dr. Civils - [phone]   [address]    [facility] [facility] - [phone]   [address]           [facility]           [address], [place], [place], [address]           [phone], Ext. [phone]           2nd and 4th Thursday of the month at 6:30am (Simple extractions only - no wisdom teeth or surgery) First come/First serve -First 10 clients served           [facility] ([place], [place] and [place] residents only)          [address], [place], [place], [address]           [phone]                    [facility]           [phone]          [facility]          [phone]         [facility]          [phone]       

## 2018-01-19 ENCOUNTER — Ambulatory Visit (INDEPENDENT_AMBULATORY_CARE_PROVIDER_SITE_OTHER): Payer: Medicaid Other | Admitting: Obstetrics and Gynecology

## 2018-01-19 ENCOUNTER — Other Ambulatory Visit (HOSPITAL_COMMUNITY)
Admission: RE | Admit: 2018-01-19 | Discharge: 2018-01-19 | Disposition: A | Discharge status: Home / Self Care | Payer: Medicaid Other | Source: Ambulatory Visit | Attending: Obstetrics and Gynecology | Admitting: Obstetrics and Gynecology

## 2018-01-19 VITALS — BP 131/87 | HR 77 | Wt 303.2 lb

## 2018-01-19 DIAGNOSIS — B379 Candidiasis, unspecified: Secondary | ICD-10-CM | POA: Diagnosis not present

## 2018-01-19 DIAGNOSIS — N898 Other specified noninflammatory disorders of vagina: Secondary | ICD-10-CM

## 2018-01-19 DIAGNOSIS — O10919 Unspecified pre-existing hypertension complicating pregnancy, unspecified trimester: Secondary | ICD-10-CM

## 2018-01-19 DIAGNOSIS — Z3480 Encounter for supervision of other normal pregnancy, unspecified trimester: Secondary | ICD-10-CM

## 2018-01-19 MED ORDER — TERCONAZOLE 0.4 % VA CREA: 1.0000 | TOPICAL_CREAM | Freq: Every day | VAGINAL | 0 refills | Status: AC

## 2018-01-19 NOTE — Progress Notes (Signed)
   PRENATAL VISIT NOTE  Subjective:  Kylie Bailey is a 27 y.o. G2P1001 at [redacted]w[redacted]d being seen today for ongoing prenatal care.  She is currently monitored for the following issues for this low-risk pregnancy and has Stress; Panic attacks; Obesity; Elevated BP without diagnosis of hypertension; Essential hypertension; Seasonal allergies; Bipolar disorder (HCC); Skin tag; Supervision of other normal pregnancy, antepartum; and Chronic hypertension during pregnancy, antepartum on their problem list.  Patient reports vaginal irritation and increased vaginal discharge.  Contractions: Not present. Vag. Bleeding: None.  Movement: Present. Denies leaking of fluid.   The following portions of the patient's history were reviewed and updated as appropriate: allergies, current medications, past family history, past medical history, past social history, past surgical history and problem list. Problem list updated.  Objective:   Vitals:   01/19/18 1404  BP: 131/87  Pulse: 77  Weight: (!) 303 lb 3.2 oz (137.5 kg)    Fetal Status: Fetal Heart Rate (bpm): 150   Movement: Present     General:  Alert, oriented and cooperative. Patient is in no acute distress.  Skin: Skin is warm and dry. No rash noted.   Cardiovascular: Normal heart rate noted  Respiratory: Normal respiratory effort, no problems with respiration noted  Abdomen: Soft, gravid, appropriate for gestational age.  Pain/Pressure: Absent     Pelvic: Cervical exam deferred SSE: Wet Prep  Extremities: Normal range of motion.  Edema: None  Mental Status: Normal mood and affect. Normal behavior. Normal judgment and thought content.   Assessment and Plan:  Pregnancy: G2P1001 at [redacted]w[redacted]d  1. Supervision of other normal pregnancy, antepartum - [place] MFM OB COMP + 14 WK in 2 weeks  2. Vaginal itching - Cervicovaginal ancillary only( [place]) - Will treat presumptively -- results pending - Rx for Terazol 0.4% cream vaginally  3. Chronic HTN  during pregnancy - Taking bASA 81 mg daily  Preterm labor symptoms and general obstetric precautions including but not limited to vaginal bleeding, contractions, leaking of fluid and fetal movement were reviewed in detail with the patient. Please refer to After Visit Summary for other counseling recommendations.  Return in about 4 weeks (around 02/16/2018) for Return OB visit.  Future Appointments  Date Time Provider Department Center  01/23/2018  8:30 AM [facility] None  02/07/2018  8:45 AM [facility] [place] 2 [facility]  02/16/2018 11:10 AM Raelyn Mora, CNM [facility] None    Raelyn Mora, CNM

## 2018-01-19 NOTE — Patient Instructions (Signed)

## 2018-01-19 NOTE — Progress Notes (Signed)
Patient reports having a vaginal itch and redness due to taking Augmentin for a possible tooth abscess. Patient reports she began itching and noticed a rash on her neck that evening after taking the 2nd dose. Patient reports she stopped taking the antibiotic and now feels like she has a yeast infection from taking it - requesting something for the vaginal itching and irritation.

## 2018-01-20 LAB — CERVICOVAGINAL ANCILLARY ONLY
BACTERIAL VAGINITIS: NEGATIVE
Candida vaginitis: POSITIVE — AB

## 2018-01-23 ENCOUNTER — Other Ambulatory Visit: Payer: Medicaid Other | Admitting: *Deleted

## 2018-01-23 DIAGNOSIS — Z3480 Encounter for supervision of other normal pregnancy, unspecified trimester: Secondary | ICD-10-CM | POA: Diagnosis not present

## 2018-01-23 NOTE — Progress Notes (Signed)
   Patient in clinic for her early 2 hour gtt, CMP and P/C ratio.  Clovis Pu, RN

## 2018-01-24 LAB — GLUCOSE TOLERANCE, 2 HOURS W/ 1HR
Glucose, 1 hour: 125 mg/dL (ref 65–179)
Glucose, 2 hour: 86 mg/dL (ref 65–152)
Glucose, Fasting: 85 mg/dL (ref 65–91)

## 2018-01-24 LAB — COMPREHENSIVE METABOLIC PANEL
ALT: 56 IU/L — ABNORMAL HIGH (ref 0–32)
AST: 40 IU/L (ref 0–40)
Albumin/Globulin Ratio: 1.6 (ref 1.2–2.2)
Albumin: 3.4 g/dL — ABNORMAL LOW (ref 3.5–5.5)
Alkaline Phosphatase: 47 IU/L (ref 39–117)
BUN/Creatinine Ratio: 11 (ref 9–23)
BUN: 8 mg/dL (ref 6–20)
CALCIUM: 8.8 mg/dL (ref 8.7–10.2)
CHLORIDE: 106 mmol/L (ref 96–106)
CO2: 19 mmol/L — ABNORMAL LOW (ref 20–29)
Creatinine, Ser: 0.7 mg/dL (ref 0.57–1.00)
GFR, EST AFRICAN AMERICAN: 139 mL/min/{1.73_m2} (ref >59)
GFR, EST NON AFRICAN AMERICAN: 121 mL/min/{1.73_m2} (ref >59)
GLUCOSE: 118 mg/dL — AB (ref 65–99)
Globulin, Total: 2.1 g/dL (ref 1.5–4.5)
Potassium: 4.3 mmol/L (ref 3.5–5.2)
Sodium: 137 mmol/L (ref 134–144)
TOTAL PROTEIN: 5.5 g/dL — AB (ref 6.0–8.5)

## 2018-01-24 LAB — PROTEIN / CREATININE RATIO, URINE
Creatinine, Urine: 141.4 mg/dL
PROTEIN/CREAT RATIO: 62 mg/g{creat} (ref 0–200)
Protein, Ur: 8.8 mg/dL

## 2018-01-30 ENCOUNTER — Encounter (HOSPITAL_COMMUNITY): Payer: Self-pay

## 2018-02-07 ENCOUNTER — Other Ambulatory Visit: Payer: Self-pay | Admitting: Obstetrics and Gynecology

## 2018-02-07 ENCOUNTER — Other Ambulatory Visit (HOSPITAL_COMMUNITY): Payer: Self-pay | Admitting: *Deleted

## 2018-02-07 ENCOUNTER — Ambulatory Visit (HOSPITAL_COMMUNITY)
Admission: RE | Admit: 2018-02-07 | Discharge: 2018-02-07 | Disposition: A | Discharge status: Home / Self Care | Payer: Medicaid Other | Source: Ambulatory Visit | Attending: Obstetrics and Gynecology | Admitting: Obstetrics and Gynecology

## 2018-02-07 DIAGNOSIS — O99212 Obesity complicating pregnancy, second trimester: Secondary | ICD-10-CM

## 2018-02-07 DIAGNOSIS — Z3482 Encounter for supervision of other normal pregnancy, second trimester: Secondary | ICD-10-CM | POA: Insufficient documentation

## 2018-02-07 DIAGNOSIS — Z363 Encounter for antenatal screening for malformations: Secondary | ICD-10-CM | POA: Diagnosis not present

## 2018-02-07 DIAGNOSIS — O99342 Other mental disorders complicating pregnancy, second trimester: Secondary | ICD-10-CM

## 2018-02-07 DIAGNOSIS — Z3A19 19 weeks gestation of pregnancy: Secondary | ICD-10-CM | POA: Insufficient documentation

## 2018-02-07 DIAGNOSIS — Z3480 Encounter for supervision of other normal pregnancy, unspecified trimester: Secondary | ICD-10-CM | POA: Diagnosis present

## 2018-02-07 DIAGNOSIS — O132 Gestational [pregnancy-induced] hypertension without significant proteinuria, second trimester: Secondary | ICD-10-CM | POA: Diagnosis not present

## 2018-02-16 ENCOUNTER — Ambulatory Visit (INDEPENDENT_AMBULATORY_CARE_PROVIDER_SITE_OTHER): Payer: Medicaid Other | Admitting: Obstetrics and Gynecology

## 2018-02-16 VITALS — BP 137/82 | HR 116 | Wt 304.0 lb

## 2018-02-16 DIAGNOSIS — Z3480 Encounter for supervision of other normal pregnancy, unspecified trimester: Secondary | ICD-10-CM | POA: Diagnosis not present

## 2018-02-16 DIAGNOSIS — O10912 Unspecified pre-existing hypertension complicating pregnancy, second trimester: Secondary | ICD-10-CM

## 2018-02-16 DIAGNOSIS — Z3482 Encounter for supervision of other normal pregnancy, second trimester: Secondary | ICD-10-CM

## 2018-02-16 DIAGNOSIS — O10919 Unspecified pre-existing hypertension complicating pregnancy, unspecified trimester: Secondary | ICD-10-CM

## 2018-02-16 NOTE — Progress Notes (Signed)
   PRENATAL VISIT NOTE  Subjective:  Kylie Bailey is a 27 y.o. G2P1001 at 1678w4d being seen today for ongoing prenatal care.  She is currently monitored for the following issues for this low-risk pregnancy and has Stress; Panic attacks; Obesity; Elevated BP without diagnosis of hypertension; Essential hypertension; Seasonal allergies; Bipolar disorder (HCC); Skin tag; Supervision of other normal pregnancy, antepartum; and Chronic hypertension during pregnancy, antepartum on their problem list.  Patient reports no complaints.  Contractions: Not present. Vag. Bleeding: None.  Movement: Present. Denies leaking of fluid.   The following portions of the patient's history were reviewed and updated as appropriate: allergies, current medications, past family history, past medical history, past social history, past surgical history and problem list. Problem list updated.  Objective:   Vitals:   02/16/18 1133  BP: 137/82  Pulse: (!) 116  Weight: (!) 304 lb (137.9 kg)    Fetal Status: Fetal Heart Rate (bpm): 153 Fundal Height: 23 cm Movement: Present     General:  Alert, oriented and cooperative. Patient is in no acute distress.  Skin: Skin is warm and dry. No rash noted.   Cardiovascular: Normal heart rate noted  Respiratory: Normal respiratory effort, no problems with respiration noted  Abdomen: Soft, gravid, appropriate for gestational age.  Pain/Pressure: Present     Pelvic: Cervical exam deferred        Extremities: Normal range of motion.  Edema: None  Mental Status: Normal mood and affect. Normal behavior. Normal judgment and thought content.   Assessment and Plan:  Pregnancy: G2P1001 at 9778w4d  1. Supervision of other normal pregnancy, antepartum - AFP today - Discussed possibility of monthly growth U/S d/t maternal habitus  2. Chronic hypertension during pregnancy, antepartum - Taking baby ASA daily   Preterm labor symptoms and general obstetric precautions including but  not limited to vaginal bleeding, contractions, leaking of fluid and fetal movement were reviewed in detail with the patient. Please refer to After Visit Summary for other counseling recommendations.  Return in about 4 weeks (around 03/16/2018) for Return OB visit.  Future Appointments  Date Time Provider Department Center  03/07/2018  8:00 AM [facility] US 3 [facility]-US  03/16/2018  4:10 PM Raelyn Moraawson, Dima Ferrufino, CNM [facility] None  04/13/2018  [facility] None  04/13/2018  9:10 AM Raelyn Moraawson, Mele Sylvester, CNM [facility] None    Raelyn Moraolitta Mickelle Goupil, CNM

## 2018-02-18 LAB — AFP TETRA
DIA Mom Value: 1.39
DIA Value (EIA): 183.9 pg/mL
DSR (BY AGE) 1 IN: 987
DSR (SECOND TRIMESTER) 1 IN: 8126
Gestational Age: 20 WEEKS
MSAFP Mom: 1.44
MSAFP: 54.4 ng/mL
MSHCG MOM: 0.81
MSHCG: 12623 m[IU]/mL
Maternal Age At EDD: 25.9 yr
Osb Risk: 3220
Test Results:: NEGATIVE
WEIGHT: 304 [lb_av]
uE3 Mom: 1.32
uE3 Value: 2.3 ng/mL

## 2018-03-05 ENCOUNTER — Other Ambulatory Visit: Payer: Self-pay

## 2018-03-05 ENCOUNTER — Inpatient Hospital Stay (HOSPITAL_COMMUNITY)
Admission: AD | Admit: 2018-03-05 | Discharge: 2018-03-05 | Disposition: A | Discharge status: Home / Self Care | Payer: Medicaid Other | Source: Ambulatory Visit | Attending: Obstetrics & Gynecology | Admitting: Obstetrics & Gynecology

## 2018-03-05 ENCOUNTER — Encounter (HOSPITAL_COMMUNITY): Payer: Self-pay

## 2018-03-05 DIAGNOSIS — Z3A23 23 weeks gestation of pregnancy: Secondary | ICD-10-CM | POA: Diagnosis not present

## 2018-03-05 DIAGNOSIS — N393 Stress incontinence (female) (male): Secondary | ICD-10-CM

## 2018-03-05 DIAGNOSIS — O9989 Other specified diseases and conditions complicating pregnancy, childbirth and the puerperium: Secondary | ICD-10-CM | POA: Diagnosis not present

## 2018-03-05 DIAGNOSIS — E860 Dehydration: Secondary | ICD-10-CM | POA: Diagnosis not present

## 2018-03-05 DIAGNOSIS — O99332 Smoking (tobacco) complicating pregnancy, second trimester: Secondary | ICD-10-CM | POA: Insufficient documentation

## 2018-03-05 DIAGNOSIS — Z880 Allergy status to penicillin: Secondary | ICD-10-CM | POA: Insufficient documentation

## 2018-03-05 DIAGNOSIS — R109 Unspecified abdominal pain: Secondary | ICD-10-CM | POA: Diagnosis present

## 2018-03-05 DIAGNOSIS — Z7982 Long term (current) use of aspirin: Secondary | ICD-10-CM | POA: Insufficient documentation

## 2018-03-05 DIAGNOSIS — O26892 Other specified pregnancy related conditions, second trimester: Secondary | ICD-10-CM | POA: Diagnosis not present

## 2018-03-05 DIAGNOSIS — F17210 Nicotine dependence, cigarettes, uncomplicated: Secondary | ICD-10-CM | POA: Insufficient documentation

## 2018-03-05 LAB — WET PREP, GENITAL
CLUE CELLS WET PREP: NONE SEEN
Sperm: NONE SEEN
TRICH WET PREP: NONE SEEN
Yeast Wet Prep HPF POC: NONE SEEN

## 2018-03-05 LAB — URINALYSIS, ROUTINE W REFLEX MICROSCOPIC
Bilirubin Urine: NEGATIVE
Glucose, UA: NEGATIVE mg/dL
Hgb urine dipstick: NEGATIVE
KETONES UR: NEGATIVE mg/dL
LEUKOCYTES UA: NEGATIVE
NITRITE: NEGATIVE
PH: 5 (ref 5.0–8.0)
PROTEIN: NEGATIVE mg/dL
Specific Gravity, Urine: 1.029 (ref 1.005–1.030)

## 2018-03-05 NOTE — MAU Note (Signed)
Pt c/o abd pain on the sides; radiating to lower abd on/off x two days. Pain 7/10Pt states,"peeing more than normal". No UTI sx's. No fever. No vag bldg or watery leakage.   Adah Perlhandra Anasia Agro RN

## 2018-03-05 NOTE — Discharge Instructions (Signed)
Eating Plan for Pregnant Women While you are pregnant, your body will require additional nutrition to help support your growing baby. It is recommended that you consume:  150 additional calories each day during your first trimester.  300 additional calories each day during your second trimester.  300 additional calories each day during your third trimester.  Eating a healthy, well-balanced diet is very important for your health and for your baby's health. You also have a higher need for some vitamins and minerals, such as folic acid, calcium, iron, and vitamin D. What do I need to know about eating during pregnancy?  Do not try to lose weight or go on a diet during pregnancy.  Choose healthy, nutritious foods. Choose  of a sandwich with a glass of milk instead of a candy bar or a high-calorie sugar-sweetened beverage.  Limit your overall intake of foods that have "empty calories." These are foods that have little nutritional value, such as sweets, desserts, candies, sugar-sweetened beverages, and fried foods.  Eat a variety of foods, especially fruits and vegetables.  Take a prenatal vitamin to help meet the additional needs during pregnancy, specifically for folic acid, iron, calcium, and vitamin D.  Remember to stay active. Ask your health care provider for exercise recommendations that are specific to you.  Practice good food safety and cleanliness, such as washing your hands before you eat and after you prepare raw meat. This helps to prevent foodborne illnesses, such as listeriosis, that can be very dangerous for your baby. Ask your health care provider for more information about listeriosis. What does 150 extra calories look like? Healthy options for an additional 150 calories each day could be any of the following:  Plain low-fat yogurt (6-8 oz) with  cup of berries.  1 apple with 2 teaspoons of peanut butter.  Cut-up vegetables with  cup of hummus.  Low-fat chocolate milk  (8 oz or 1 cup).  1 string cheese with 1 medium orange.   of a peanut butter and jelly sandwich on whole-wheat bread (1 tsp of peanut butter).  For 300 calories, you could eat two of those healthy options each day. What is a healthy amount of weight to gain? The recommended amount of weight for you to gain is based on your pre-pregnancy BMI. If your pre-pregnancy BMI was:  Less than 18 (underweight), you should gain 28-40 lb.  18-24.9 (normal), you should gain 25-35 lb.  25-29.9 (overweight), you should gain 15-25 lb.  Greater than 30 (obese), you should gain 11-20 lb.  What if I am having twins or multiples? Generally, pregnant women who will be having twins or multiples may need to increase their daily calories by 300-600 calories each day. The recommended range for total weight gain is 25-54 lb, depending on your pre-pregnancy BMI. Talk with your health care provider for specific guidance about additional nutritional needs, weight gain, and exercise during your pregnancy. What foods can I eat? Grains Any grains. Try to choose whole grains, such as whole-wheat bread, oatmeal, or brown rice. Vegetables Any vegetables. Try to eat a variety of colors and types of vegetables to get a full range of vitamins and minerals. Remember to wash your vegetables well before eating. Fruits Any fruits. Try to eat a variety of colors and types of fruit to get a full range of vitamins and minerals. Remember to wash your fruits well before eating. Meats and Other Protein Sources Lean meats, including chicken, [place], fish, and lean cuts of beef, veal,  or pork. Make sure that all meats are cooked to "well done." Tofu. Tempeh. Beans. Eggs. Peanut butter and other nut butters. Seafood, such as shrimp, crab, and lobster. If you choose fish, select types that are higher in omega-3 fatty acids, including salmon, herring, mussels, trout, sardines, and pollock. Make sure that all meats are cooked to food-safe  temperatures. Dairy Pasteurized milk and milk alternatives. Pasteurized yogurt and pasteurized cheese. Cottage cheese. Sour cream. Beverages Water. Juices that contain 100% fruit juice or vegetable juice. Caffeine-free teas and decaffeinated coffee. Drinks that contain caffeine are okay to drink, but it is better to avoid caffeine. Keep your total caffeine intake to less than 200 mg each day (12 oz of coffee, tea, or soda) or as directed by your health care provider. Condiments Any pasteurized condiments. Sweets and Desserts Any sweets and desserts. Fats and Oils Any fats and oils. The items listed above may not be a complete list of recommended foods or beverages. Contact your dietitian for more options. What foods are not recommended? Vegetables Unpasteurized (raw) vegetable juices. Fruits Unpasteurized (raw) fruit juices. Meats and Other Protein Sources Cured meats that have nitrates, such as bacon, salami, and hotdogs. Luncheon meats, bologna, or other deli meats (unless they are reheated until they are steaming hot). Refrigerated pate, meat spreads from a meat counter, smoked seafood that is found in the refrigerated section of a store. Raw fish, such as sushi or sashimi. High mercury content fish, such as tilefish, shark, swordfish, and king mackerel. Raw meats, such as tuna or beef tartare. Undercooked meats and poultry. Make sure that all meats are cooked to food-safe temperatures. Dairy Unpasteurized (raw) milk and any foods that have raw milk in them. Soft cheeses, such as feta, queso blanco, queso fresco, Brie, Camembert cheeses, blue-veined cheeses, and Panela cheese (unless it is made with pasteurized milk, which must be stated on the label). Beverages Alcohol. Sugar-sweetened beverages, such as sodas, teas, or energy drinks. Condiments Homemade fermented foods and drinks, such as pickles, sauerkraut, or kombucha drinks. (Store-bought pasteurized versions of these are  okay.) Other Salads that are made in the store, such as ham salad, chicken salad, egg salad, tuna salad, and seafood salad. The items listed above may not be a complete list of foods and beverages to avoid. Contact your dietitian for more information. This information is not intended to replace advice given to you by your health care provider. Make sure you discuss any questions you have with your health care provider. Document Released: 01/04/2014 Document Revised: 08/28/2015 Document Reviewed: 09/04/2013 Elsevier Interactive Patient Education  2018 [employer]  Urinary Incontinence Urinary incontinence is the involuntary loss of urine from your bladder. What are the causes? There are many causes of urinary incontinence. They include:  Medicines.  Infections.  Prostatic enlargement, leading to overflow of urine from your bladder.  Surgery.  Neurological diseases.  Emotional factors.  What are the signs or symptoms? Urinary Incontinence can be divided into four types: 1. Urge incontinence. Urge incontinence is the involuntary loss of urine before you have the opportunity to go to the bathroom. There is a sudden urge to void but not enough time to reach a bathroom. 2. Stress incontinence. Stress incontinence is the sudden loss of urine with any activity that forces urine to pass. It is commonly caused by anatomical changes to the pelvis and sphincter areas of your body. 3. Overflow incontinence. Overflow incontinence is the loss of urine from an obstructed opening to your bladder. This  results in a backup of urine and a resultant buildup of pressure within the bladder. When the pressure within the bladder exceeds the closing pressure of the sphincter, the urine overflows, which causes incontinence, similar to water overflowing a dam. 4. Total incontinence. Total incontinence is the loss of urine as a result of the inability to store urine within your bladder.  How is this  diagnosed? Evaluating the cause of incontinence may require:  A thorough and complete medical and obstetric history.  A complete physical exam.  Laboratory tests such as a urine culture and sensitivities.  When additional tests are indicated, they can include:  An ultrasound exam.  Kidney and bladder X-rays.  Cystoscopy. This is an exam of the bladder using a narrow scope.  Urodynamic testing to test the nerve function to the bladder and sphincter areas.  How is this treated? Treatment for urinary incontinence depends on the cause:  For urge incontinence caused by a bacterial infection, antibiotics will be prescribed. If the urge incontinence is related to medicines you take, your health care provider may have you change the medicine.  For stress incontinence, surgery to re-establish anatomical support to the bladder or sphincter, or both, will often correct the condition.  For overflow incontinence caused by an enlarged prostate, an operation to open the channel through the enlarged prostate will allow the flow of urine out of the bladder. In women with fibroids, a hysterectomy may be recommended.  For total incontinence, surgery on your urinary sphincter may help. An artificial urinary sphincter (an inflatable cuff placed around the urethra) may be required. In women who have developed a hole-like passage between their bladder and vagina (vesicovaginal fistula), surgery to close the fistula often is required.  Follow these instructions at home:  Normal daily hygiene and the use of pads or adult diapers that are changed regularly will help prevent odors and skin damage.  Avoid caffeine. It can overstimulate your bladder.  Use the bathroom regularly. Try about every 2-3 hours to go to the bathroom, even if you do not feel the need to do so. Take time to empty your bladder completely. After urinating, wait a minute. Then try to urinate again.  For causes involving nerve  dysfunction, keep a log of the medicines you take and a journal of the times you go to the bathroom. Contact a health care provider if:  You experience worsening of pain instead of improvement in pain after your procedure.  Your incontinence becomes worse instead of better. Get help right away if:  You experience fever or shaking chills.  You are unable to pass your urine.  You have redness spreading into your groin or down into your thighs. This information is not intended to replace advice given to you by your health care provider. Make sure you discuss any questions you have with your health care provider. Document Released: 04/29/2004 Document Revised: 10/31/2015 Document Reviewed: 08/29/2012 Elsevier Interactive Patient Education  [employer].

## 2018-03-05 NOTE — MAU Provider Note (Signed)
History     CSN: [id number]  Arrival date and time: 03/05/18 2105   First Provider Initiated Contact with Patient 03/05/18 2143      Chief Complaint  Patient presents with  . Abdominal Pain   G2P1001 @23 .0 wks here with urinary incontinence and LAP pain. Sx started last night. Reports several episodes of urinary incontinence today. Pain in abd occurs mostly at the same time. Denies dysuria, urgency, and hematuria. Having LBP. No fevers. No VB. Having some clear/white thin vaginal discharge. No LOF or ctx. Feeling FM. Admits to poor water intake today, doesn't feel thirsty.   OB History    Gravida  2   Para  1   Term  1   Preterm      AB      Living  1     SAB      TAB      Ectopic      Multiple      Live Births  1           Past Medical History:  Diagnosis Date  . Anxiety   . Bulimia   . Depression   . GERD (gastroesophageal reflux disease)   . Hypertension   . Medical history non-contributory   . Migraines     Past Surgical History:  Procedure Laterality Date  . TONSILLECTOMY    . TYMPANOSTOMY TUBE PLACEMENT Bilateral     Family History  Problem Relation Age of Onset  . Hypertension Mother   . Heart attack Mother   . Drug abuse Father   . High Cholesterol Brother   . Diabetes Paternal Grandmother   . High blood pressure Paternal Grandmother   . Heart disease Paternal Grandmother   . Heart disease Maternal Uncle     Social History   Tobacco Use  . Smoking status: Current Every Day Smoker    Packs/day: 0.25    Years: 3.00    Pack years: 0.75    Types: Cigarettes  . Smokeless tobacco: Never Used  . Tobacco comment: working on quitting, at 4/day now - 11/16/17. Trying to quit, 1 cig/day  Substance Use Topics  . Alcohol use: No    Comment: occ  . Drug use: No    Allergies:  Allergies  Allergen Reactions  . Augmentin [Amoxicillin-Pot Clavulanate] Rash    No medications prior to admission.    Review of Systems   Constitutional: Negative for chills and fever.  Gastrointestinal: Positive for abdominal pain. Negative for constipation, diarrhea, nausea and vomiting.  Genitourinary: Positive for frequency and vaginal discharge. Negative for dysuria, hematuria, urgency and vaginal bleeding.  Musculoskeletal: Positive for back pain.   Physical Exam   Blood pressure 140/65, pulse (!) 113, temperature 98 F (36.7 C), temperature source Oral, resp. rate 16, height 5\' 9"  (1.753 m), weight (!) 143.8 kg, last menstrual period 09/25/2017, SpO2 100 %.  Physical Exam  Constitutional: She is oriented to person, place, and time. She appears well-developed and well-nourished. No distress.  HENT:  Head: Normocephalic and atraumatic.  Neck: Normal range of motion.  Cardiovascular: Normal rate.  Respiratory: Effort normal. No respiratory distress.  GI: Soft. She exhibits no distension. There is no tenderness. There is no CVA tenderness.  Gravid, obese  Genitourinary:  Genitourinary Comments: Cervix closed/long  Musculoskeletal: Normal range of motion.       Cervical back: Normal. She exhibits no tenderness.       Thoracic back: Normal. She exhibits no tenderness.  Lumbar back: Normal. She exhibits no tenderness.  Neurological: She is alert and oriented to person, place, and time.  Skin: Skin is warm and dry.  Psychiatric: She has a normal mood and affect.  EFM: 145 bpm, min variability, + accels, no decels Toco: none  Results for orders placed or performed during the hospital encounter of 03/05/18 (from the past 24 hour(s))  Urinalysis, Routine w reflex microscopic     Status: Abnormal   Collection Time: 03/05/18  9:14 PM  Result Value Ref Range   Color, Urine YELLOW YELLOW   APPearance HAZY (A) CLEAR   Specific Gravity, Urine 1.029 1.005 - 1.030   pH 5.0 5.0 - 8.0   Glucose, UA NEGATIVE NEGATIVE mg/dL   Hgb urine dipstick NEGATIVE NEGATIVE   Bilirubin Urine NEGATIVE NEGATIVE   Ketones, ur  NEGATIVE NEGATIVE mg/dL   Protein, ur NEGATIVE NEGATIVE mg/dL   Nitrite NEGATIVE NEGATIVE   Leukocytes, UA NEGATIVE NEGATIVE  Wet prep, genital     Status: Abnormal   Collection Time: 03/05/18 10:05 PM  Result Value Ref Range   Yeast Wet Prep HPF POC NONE SEEN NONE SEEN   Trich, Wet Prep NONE SEEN NONE SEEN   Clue Cells Wet Prep HPF POC NONE SEEN NONE SEEN   WBC, Wet Prep HPF POC FEW (A) NONE SEEN   Sperm NONE SEEN    MAU Course  Procedures Po hydration  MDM Labs ordered and reviewed. Dehydration noted, UA normal otherwise. Will send UC since having incontinence, although likely from advancing pregnancy. No evidence of UTI or PTL. Stable for discharge home.   Assessment and Plan   1. [redacted] weeks gestation of pregnancy   2. Stress incontinence of urine   3. Dehydration    Discharge home Follow up in OB office as scheduled PTL precautions Hydrate  Allergies as of 03/05/2018      Reactions   Augmentin [amoxicillin-pot Clavulanate] Rash      Medication List    TAKE these medications   acetaminophen 325 MG tablet Commonly known as:  TYLENOL Take 650 mg by mouth every 6 (six) hours as needed for moderate pain.   albuterol 108 (90 Base) MCG/ACT inhaler Commonly known as:  PROVENTIL HFA;VENTOLIN HFA Inhale 1-2 puffs into the lungs every 6 (six) hours as needed for wheezing or shortness of breath.   aspirin 81 MG chewable tablet Chew 1 tablet (81 mg total) by mouth daily.   calcium carbonate 500 MG chewable tablet Commonly known as:  TUMS - dosed in mg elemental calcium Chew 2 tablets by mouth 2 (two) times daily as needed for indigestion or heartburn.   Prenatal Adult Gummy/DHA/FA 0.4-25 MG Chew Chew 1 tablet by mouth daily.      Donette LarryMelanie Dvaughn Fickle, CNM 03/05/2018, 11:37 PM

## 2018-03-06 LAB — GC/CHLAMYDIA PROBE AMP (~~LOC~~) NOT AT ARMC
CHLAMYDIA, DNA PROBE: NEGATIVE
NEISSERIA GONORRHEA: NEGATIVE

## 2018-03-07 ENCOUNTER — Ambulatory Visit (HOSPITAL_COMMUNITY)
Admission: RE | Admit: 2018-03-07 | Discharge: 2018-03-07 | Disposition: A | Discharge status: Home / Self Care | Payer: Medicaid Other | Source: Ambulatory Visit | Attending: Obstetrics and Gynecology | Admitting: Obstetrics and Gynecology

## 2018-03-07 DIAGNOSIS — O132 Gestational [pregnancy-induced] hypertension without significant proteinuria, second trimester: Secondary | ICD-10-CM | POA: Insufficient documentation

## 2018-03-07 DIAGNOSIS — Z3A23 23 weeks gestation of pregnancy: Secondary | ICD-10-CM | POA: Diagnosis not present

## 2018-03-07 DIAGNOSIS — O99212 Obesity complicating pregnancy, second trimester: Secondary | ICD-10-CM | POA: Insufficient documentation

## 2018-03-07 DIAGNOSIS — Z362 Encounter for other antenatal screening follow-up: Secondary | ICD-10-CM | POA: Diagnosis not present

## 2018-03-07 DIAGNOSIS — O99342 Other mental disorders complicating pregnancy, second trimester: Secondary | ICD-10-CM | POA: Insufficient documentation

## 2018-03-07 LAB — CULTURE, OB URINE: CULTURE: NO GROWTH

## 2018-03-16 ENCOUNTER — Encounter: Payer: Self-pay | Admitting: Obstetrics and Gynecology

## 2018-03-16 ENCOUNTER — Ambulatory Visit (INDEPENDENT_AMBULATORY_CARE_PROVIDER_SITE_OTHER): Payer: Medicaid Other | Admitting: Obstetrics and Gynecology

## 2018-03-16 VITALS — BP 135/80 | HR 96 | Wt 317.0 lb

## 2018-03-16 DIAGNOSIS — Z3402 Encounter for supervision of normal first pregnancy, second trimester: Secondary | ICD-10-CM

## 2018-03-16 DIAGNOSIS — O10912 Unspecified pre-existing hypertension complicating pregnancy, second trimester: Secondary | ICD-10-CM

## 2018-03-16 DIAGNOSIS — O2602 Excessive weight gain in pregnancy, second trimester: Secondary | ICD-10-CM | POA: Insufficient documentation

## 2018-03-16 DIAGNOSIS — O10919 Unspecified pre-existing hypertension complicating pregnancy, unspecified trimester: Secondary | ICD-10-CM

## 2018-03-16 NOTE — Progress Notes (Addendum)
PRENATAL VISIT NOTE  Subjective:  Kylie Bailey is a 27 y.o. G2P1001 at 7031w4d being seen today for ongoing prenatal care.  She is currently monitored for the following issues for this low-risk pregnancy and has Stress; Panic attacks; Obesity; Elevated BP without diagnosis of hypertension; Essential hypertension; Seasonal allergies; Bipolar disorder (HCC); Skin tag; Supervision of other normal pregnancy, antepartum; Chronic hypertension during pregnancy, antepartum; and Excessive weight gain during pregnancy in second trimester on their problem list.  Patient reports no bleeding, no contractions, no cramping, no leaking and urinary incontinence.  Contractions: Not present. Vag. Bleeding: None.  Movement: Present. Denies leaking of fluid.   The following portions of the patient's history were reviewed and updated as appropriate: allergies, current medications, past family history, past medical history, past social history, past surgical history and problem list. Problem list updated.  Objective:   Vitals:   03/16/18 1614  BP: 135/80  Pulse: 96  Weight: (!) 143.8 kg    Fetal Status: Fetal Heart Rate (bpm): 147 Fundal Height: 30 cm Movement: Present    S=D by palpation/Leopold's maneuvers General:  Alert, oriented and cooperative. Patient is in no acute distress.  Skin: Skin is warm and dry. No rash noted.   Cardiovascular: Normal heart rate noted  Respiratory: Normal respiratory effort, no problems with respiration noted  Abdomen: Soft, gravid, appropriate for gestational age.  Pain/Pressure: Absent     Pelvic: Cervical exam deferred        Extremities: Normal range of motion.  Edema: None  Mental Status: Normal mood and affect. Normal behavior. Normal judgment and thought content.   Assessment and Plan:  Pregnancy: G2P1001 at 7631w4d  Chronic hypertension during pregnancy, antepartum  US MFM OB FOLLOW UP for growth in 4 wks  Encounter for supervision of normal first  pregnancy in second trimester Discussed use of kegels and lower pelvic floor exercises to prevent urgency/incontinence.  Excessive weight gain during pregnancy in second trimester Referral for food resources by pregnancy care manager Marlis Edelson(Caitlin P, RN)  [facility]   Allergies as of 03/16/2018      Reactions   Augmentin [amoxicillin-pot Clavulanate] Rash      Medication List       Accurate as of March 16, 2018  4:49 PM. Always use your most recent med list.        acetaminophen 325 MG tablet Commonly known as:  TYLENOL Take 650 mg by mouth every 6 (six) hours as needed for moderate pain.   albuterol 108 (90 Base) MCG/ACT inhaler Commonly known as:  PROVENTIL HFA;VENTOLIN HFA Inhale 1-2 puffs into the lungs every 6 (six) hours as needed for wheezing or shortness of breath.   aspirin 81 MG chewable tablet Chew 1 tablet (81 mg total) by mouth daily.   calcium carbonate 500 MG chewable tablet Commonly known as:  TUMS - dosed in mg elemental calcium Chew 2 tablets by mouth 2 (two) times daily as needed for indigestion or heartburn.   Prenatal Adult Gummy/DHA/FA 0.4-25 MG Chew Chew 1 tablet by mouth daily.      Preterm labor symptoms and general obstetric precautions including but not limited to vaginal bleeding, contractions, leaking of fluid and fetal movement were reviewed in detail with the patient. Please refer to After Visit Summary for other counseling recommendations.   Future Appointments  Date Time Provider Department Center  04/13/2018  8:30 AM [facility] None  04/13/2018  9:10 AM Raelyn Moraawson, Rol[place], CNM [facility] None  Bernerd Limbo, Student-MidWife

## 2018-03-16 NOTE — Patient Instructions (Signed)
Urinary Incontinence Urinary incontinence is the involuntary loss of urine from your bladder. What are the causes? There are many causes of urinary incontinence. They include:  Medicines.  Infections.  Prostatic enlargement, leading to overflow of urine from your bladder.  Surgery.  Neurological diseases.  Emotional factors.  What are the signs or symptoms? Urinary Incontinence can be divided into four types: 1. Urge incontinence. Urge incontinence is the involuntary loss of urine before you have the opportunity to go to the bathroom. There is a sudden urge to void but not enough time to reach a bathroom. 2. Stress incontinence. Stress incontinence is the sudden loss of urine with any activity that forces urine to pass. It is commonly caused by anatomical changes to the pelvis and sphincter areas of your body. 3. Overflow incontinence. Overflow incontinence is the loss of urine from an obstructed opening to your bladder. This results in a backup of urine and a resultant buildup of pressure within the bladder. When the pressure within the bladder exceeds the closing pressure of the sphincter, the urine overflows, which causes incontinence, similar to water overflowing a dam. 4. Total incontinence. Total incontinence is the loss of urine as a result of the inability to store urine within your bladder.  How is this diagnosed? Evaluating the cause of incontinence may require:  A thorough and complete medical and obstetric history.  A complete physical exam.  Laboratory tests such as a urine culture and sensitivities.  When additional tests are indicated, they can include:  An ultrasound exam.  Kidney and bladder X-rays.  Cystoscopy. This is an exam of the bladder using a narrow scope.  Urodynamic testing to test the nerve function to the bladder and sphincter areas.  How is this treated? Treatment for urinary incontinence depends on the cause:  For urge incontinence caused  by a bacterial infection, antibiotics will be prescribed. If the urge incontinence is related to medicines you take, your health care provider may have you change the medicine.  For stress incontinence, surgery to re-establish anatomical support to the bladder or sphincter, or both, will often correct the condition.  For overflow incontinence caused by an enlarged prostate, an operation to open the channel through the enlarged prostate will allow the flow of urine out of the bladder. In women with fibroids, a hysterectomy may be recommended.  For total incontinence, surgery on your urinary sphincter may help. An artificial urinary sphincter (an inflatable cuff placed around the urethra) may be required. In women who have developed a hole-like passage between their bladder and vagina (vesicovaginal fistula), surgery to close the fistula often is required.  Follow these instructions at home:  Normal daily hygiene and the use of pads or adult diapers that are changed regularly will help prevent odors and skin damage.  Avoid caffeine. It can overstimulate your bladder.  Use the bathroom regularly. Try about every 2-3 hours to go to the bathroom, even if you do not feel the need to do so. Take time to empty your bladder completely. After urinating, wait a minute. Then try to urinate again.  For causes involving nerve dysfunction, keep a log of the medicines you take and a journal of the times you go to the bathroom. Contact a health care provider if:  You experience worsening of pain instead of improvement in pain after your procedure.  Your incontinence becomes worse instead of better. Get help right away if:  You experience fever or shaking chills.  You are unable to   pass your urine.  You have redness spreading into your groin or down into your thighs. This information is not intended to replace advice given to you by your health care provider. Make sure you discuss any questions you have  with your health care provider. Document Released: 04/29/2004 Document Revised: 10/31/2015 Document Reviewed: 08/29/2012 Elsevier Interactive Patient Education  2018 [employer]  

## 2018-03-21 ENCOUNTER — Encounter (HOSPITAL_COMMUNITY): Payer: Self-pay | Admitting: Family Medicine

## 2018-03-21 ENCOUNTER — Telehealth: Payer: Self-pay | Admitting: General Practice

## 2018-03-21 ENCOUNTER — Ambulatory Visit (HOSPITAL_COMMUNITY)
Admission: EM | Admit: 2018-03-21 | Discharge: 2018-03-21 | Disposition: A | Discharge status: Home / Self Care | Payer: Medicaid Other | Attending: Family Medicine | Admitting: Family Medicine

## 2018-03-21 DIAGNOSIS — J0101 Acute recurrent maxillary sinusitis: Secondary | ICD-10-CM

## 2018-03-21 DIAGNOSIS — H65113 Acute and subacute allergic otitis media (mucoid) (sanguinous) (serous), bilateral: Secondary | ICD-10-CM

## 2018-03-21 MED ORDER — CEFDINIR 300 MG PO CAPS: 600.0000 mg | ORAL_CAPSULE | Freq: Every day | ORAL | 0 refills | Status: DC

## 2018-03-21 NOTE — ED Notes (Signed)
Staff rechecked patient blood pressure in her right arm 111/84

## 2018-03-21 NOTE — Telephone Encounter (Signed)
Patient called this morning stating that she went to Urgent care this morning for sinus related issues and her BP was elevated.  The doctor advised her to call our office to see if she need to be seen by provider.  Message sent to provider.  Patient verbalized understanding.

## 2018-03-21 NOTE — ED Triage Notes (Signed)
Pt present coughing and nasal congestion for over a month.

## 2018-03-21 NOTE — ED Provider Notes (Signed)
[facility]    CSN: [id number] Arrival date & time: 03/21/18  [id number]     History   Chief Complaint Chief Complaint  Patient presents with  . Cough  . Nasal Congestion    HPI Adline PealsMillinda L Weltman is a 27 y.o. female.   Is a 27 year old woman who is pregnant at [redacted] weeks.  She has been having sinus congestion for over a month.  She has a long history of recurrent ear infections.  She also has hypertension, worse now that she is pregnant.  She has been getting medical care for the hypertension by her obstetrician.  Patient has no acute headache but does have facial congestion and cough.  She says that she feels hot and has been diaphoretic but does not have any recorded fever.  She is not short of breath and has no chest pain.     Past Medical History:  Diagnosis Date  . Anxiety   . Bulimia   . Depression   . GERD (gastroesophageal reflux disease)   . Hypertension   . Medical history non-contributory   . Migraines     Patient Active Problem List   Diagnosis Date Noted  . Excessive weight gain during pregnancy in second trimester 03/16/2018  . Chronic hypertension during pregnancy, antepartum 12/23/2017  . Supervision of other normal pregnancy, antepartum 10/31/2017  . Bipolar disorder (HCC) 10/07/2017  . Skin tag 10/07/2017  . Seasonal allergies 08/18/2017  . Essential hypertension 12/03/2016  . Stress 12/11/2015  . Panic attacks 12/11/2015  . Obesity 12/11/2015  . Elevated BP without diagnosis of hypertension 12/11/2015    Past Surgical History:  Procedure Laterality Date  . TONSILLECTOMY    . TYMPANOSTOMY TUBE PLACEMENT Bilateral     OB History    Gravida  2   Para  1   Term  1   Preterm      AB      Living  1     SAB      TAB      Ectopic      Multiple      Live Births  1            Home Medications    Prior to Admission medications   Medication Sig Start Date End Date Taking? Authorizing Provider  acetaminophen  (TYLENOL) 325 MG tablet Take 650 mg by mouth every 6 (six) hours as needed for moderate pain.    [provider]  albuterol (PROVENTIL HFA;VENTOLIN HFA) 108 (90 Base) MCG/ACT inhaler Inhale 1-2 puffs into the lungs every 6 (six) hours as needed for wheezing or shortness of breath. 12/14/17   Donette LarryBhambri, Melanie, CNM  aspirin 81 MG chewable tablet Chew 1 tablet (81 mg total) by mouth daily. 12/23/17   Raelyn Moraawson, Rolitta, CNM  calcium carbonate (TUMS - DOSED IN MG ELEMENTAL CALCIUM) 500 MG chewable tablet Chew 2 tablets by mouth 2 (two) times daily as needed for indigestion or heartburn.    [provider]  cefdinir (OMNICEF) 300 MG capsule Take 2 capsules (600 mg total) by mouth daily. 03/21/18   Elvina SidleLauenstein, Pria Klosinski, MD  Prenatal MV & Min w/FA-DHA (PRENATAL ADULT GUMMY/DHA/FA) 0.4-25 MG CHEW Chew 1 tablet by mouth daily. 10/31/17   Freddrick MarchAmin, Yashika, MD    Family History Family History  Problem Relation Age of Onset  . Hypertension Mother   . Heart attack Mother   . Drug abuse Father   . High Cholesterol Brother   . Diabetes Paternal  Grandmother   . High blood pressure Paternal Grandmother   . Heart disease Paternal Grandmother   . Heart disease Maternal Uncle     Social History Social History   Tobacco Use  . Smoking status: Current Every Day Smoker    Packs/day: 0.25    Years: 3.00    Pack years: 0.75    Types: Cigarettes  . Smokeless tobacco: Never Used  . Tobacco comment: working on quitting, at 4/day now - 11/16/17. Trying to quit, 1 cig/day  Substance Use Topics  . Alcohol use: No    Comment: occ  . Drug use: No     Allergies   Augmentin [amoxicillin-pot clavulanate]   Review of Systems Review of Systems  Constitutional: Positive for diaphoresis.  HENT: Positive for congestion.   Respiratory: Positive for cough.      Physical Exam Triage Vital Signs ED Triage Vitals [03/21/18 0819]  Enc Vitals Group     BP (!) 158/119     Pulse Rate 100     Resp 16      Temp 98.3 F (36.8 C)     Temp Source Oral     SpO2 97 %     Weight      Height      Head Circumference      Peak Flow      Pain Score      Pain Loc      Pain Edu?      Excl. in GC?    No data found.  Updated Vital Signs BP 111/84 (BP Location: Right Arm)   Pulse 100   Temp 98.3 F (36.8 C) (Oral)   Resp 16   LMP 09/25/2017 (Exact Date)   SpO2 97%    Physical Exam Vitals signs and nursing note reviewed.  Constitutional:      Appearance: Normal appearance. She is normal weight.  HENT:     Head: Normocephalic.     Comments: Both TMs show scarring and irregular surface.  There is significant mucus buildup in the middle ear.    Right Ear: External ear normal.     Left Ear: External ear normal.     Nose: Congestion present.     Mouth/Throat:     Pharynx: Oropharynx is clear.  Eyes:     Conjunctiva/sclera: Conjunctivae normal.  Neck:     Musculoskeletal: Normal range of motion and neck supple.  Cardiovascular:     Rate and Rhythm: Normal rate.     Pulses: Normal pulses.  Pulmonary:     Effort: Pulmonary effort is normal.     Breath sounds: Normal breath sounds.  Musculoskeletal: Normal range of motion.  Skin:    General: Skin is warm and dry.  Neurological:     General: No focal deficit present.     Mental Status: She is alert.  Psychiatric:        Mood and Affect: Mood normal.      UC Treatments / Results  Labs (all labs ordered are listed, but only abnormal results are displayed) Labs Reviewed - No data to display  EKG None  Radiology No results found.  Procedures Procedures (including critical care time)  Medications Ordered in UC Medications - No data to display  Initial Impression / Assessment and Plan / UC Course  I have reviewed the triage vital signs and the nursing notes.  Pertinent labs & imaging results that were available during my care of the patient were reviewed by me and  considered in my medical decision making (see chart for  details).    Final Clinical Impressions(s) / UC Diagnoses   Final diagnoses:  Acute recurrent maxillary sinusitis  Acute mucoid otitis media of both ears   Discharge Instructions   None    ED Prescriptions    Medication Sig Dispense Auth. Provider   cefdinir (OMNICEF) 300 MG capsule Take 2 capsules (600 mg total) by mouth daily. 20 capsule Elvina Sidle, MD     Controlled Substance Prescriptions  Controlled Substance Registry consulted? Not Applicable   Elvina Sidle, MD 03/21/18 [phone]

## 2018-03-23 ENCOUNTER — Other Ambulatory Visit: Payer: Medicaid Other

## 2018-04-05 NOTE — L&D Delivery Note (Addendum)
[facility] Delivery Note  Kylie Bailey is a 28 y.o. G2P1001 s/p SVD at [redacted]w[redacted]d. She was admitted for IOL for decreased fetal movement.   ROM: 4h 38m with clear fluid GBS Status: negative Maximum Maternal Temperature: 99.2  Labor Progress: . Augmentation with cytotec, AROM and pitocin   Delivery Date/Time: 8588 06/28/2018 Delivery: Called to room and patient was complete and pushing. Head delivered ROA. No nuchal cord present x1, loose and reduced prior to delivery of the shoulders. Posterior arm folded close to baby's head and posterior shoulder was delivered prior to anterior shoulder. Body delivered in usual fashion. Infant with spontaneous cry, placed on mother's abdomen, dried and stimulated. Cord clamped x 2 after 1-minute delay, and cut by FOB. Cord blood drawn. Placenta delivered spontaneously with gentle cord traction. Fundus firm with massage and Pitocin. Labia, perineum, vagina, and cervix inspected inspected with small, hemostatic R periurethral laceration, elected to not repair.   Placenta: delivered spontaneously, intact Complications: none Lacerations: R periurethral, hemostatic, no repair indicated EBL: 42ml  Infant: vigorous female  APGARs 9&9  weight pending  Burman Nieves, MD Family Medicine Resident

## 2018-04-06 ENCOUNTER — Other Ambulatory Visit: Payer: Self-pay

## 2018-04-06 ENCOUNTER — Other Ambulatory Visit: Payer: Medicaid Other

## 2018-04-06 ENCOUNTER — Inpatient Hospital Stay (HOSPITAL_COMMUNITY)
Admission: AD | Admit: 2018-04-06 | Discharge: 2018-04-06 | Disposition: A | Discharge status: Home / Self Care | Payer: Medicaid Other | Attending: Obstetrics & Gynecology | Admitting: Obstetrics & Gynecology

## 2018-04-06 DIAGNOSIS — B9789 Other viral agents as the cause of diseases classified elsewhere: Secondary | ICD-10-CM

## 2018-04-06 DIAGNOSIS — O99512 Diseases of the respiratory system complicating pregnancy, second trimester: Secondary | ICD-10-CM | POA: Diagnosis not present

## 2018-04-06 DIAGNOSIS — Z880 Allergy status to penicillin: Secondary | ICD-10-CM | POA: Insufficient documentation

## 2018-04-06 DIAGNOSIS — O26892 Other specified pregnancy related conditions, second trimester: Secondary | ICD-10-CM

## 2018-04-06 DIAGNOSIS — Z3A27 27 weeks gestation of pregnancy: Secondary | ICD-10-CM | POA: Diagnosis not present

## 2018-04-06 DIAGNOSIS — F17210 Nicotine dependence, cigarettes, uncomplicated: Secondary | ICD-10-CM | POA: Insufficient documentation

## 2018-04-06 DIAGNOSIS — J101 Influenza due to other identified influenza virus with other respiratory manifestations: Secondary | ICD-10-CM | POA: Diagnosis not present

## 2018-04-06 DIAGNOSIS — O99332 Smoking (tobacco) complicating pregnancy, second trimester: Secondary | ICD-10-CM | POA: Insufficient documentation

## 2018-04-06 DIAGNOSIS — J069 Acute upper respiratory infection, unspecified: Secondary | ICD-10-CM

## 2018-04-06 LAB — INFLUENZA PANEL BY PCR (TYPE A & B)
INFLAPCR: NEGATIVE
Influenza B By PCR: POSITIVE — AB

## 2018-04-06 LAB — GROUP A STREP BY PCR: Group A Strep by PCR: NOT DETECTED

## 2018-04-06 MED ORDER — OSELTAMIVIR PHOSPHATE 75 MG PO CAPS: 75.0000 mg | ORAL_CAPSULE | Freq: Two times a day (BID) | ORAL | 0 refills | Status: DC

## 2018-04-06 NOTE — MAU Provider Note (Signed)
History     CSN: [id number]  Arrival date and time: 04/06/18 1235   First Provider Initiated Contact with Patient 04/06/18 1305      Chief Complaint  Patient presents with  . Generalized Body Aches  . Pelvic Pain  . Sore Throat  . Cough   Kylie Bailey is a 28 y.o. G2P1001 at [redacted]w[redacted]d who presents today with flu like symptoms. She reports her husband was told he had the flu and was given tamilflu. He did not take the tamiflu because they never tested him they just told him he probably had the flu. Patient reports that she did not get a flu vaccine this year. She reports her symptoms started six days ago. She reports normal fetal movement. She denies any contractions, VB or LOF.   URI   This is a new problem. The current episode started in the past 7 days. The problem has been unchanged. There has been no fever. Associated symptoms include a sore throat. Pertinent negatives include no coughing, nausea or vomiting. She has tried acetaminophen, decongestant and increased fluids for the symptoms. The treatment provided mild relief.     OB History    Gravida  2   Para  1   Term  1   Preterm      AB      Living  1     SAB      TAB      Ectopic      Multiple      Live Births  1           Past Medical History:  Diagnosis Date  . Anxiety   . Bulimia   . Depression   . GERD (gastroesophageal reflux disease)   . Hypertension   . Medical history non-contributory   . Migraines     Past Surgical History:  Procedure Laterality Date  . TONSILLECTOMY    . TYMPANOSTOMY TUBE PLACEMENT Bilateral     Family History  Problem Relation Age of Onset  . Hypertension Mother   . Heart attack Mother   . Drug abuse Father   . High Cholesterol Brother   . Diabetes Paternal Grandmother   . High blood pressure Paternal Grandmother   . Heart disease Paternal Grandmother   . Heart disease Maternal Uncle     Social History   Tobacco Use  . Smoking status: Current  Every Day Smoker    Packs/day: 0.25    Years: 3.00    Pack years: 0.75    Types: Cigarettes  . Smokeless tobacco: Never Used  . Tobacco comment: working on quitting, at 4/day now - 11/16/17. Trying to quit, 1 cig/day  Substance Use Topics  . Alcohol use: No    Comment: occ  . Drug use: No    Allergies:  Allergies  Allergen Reactions  . Augmentin [Amoxicillin-Pot Clavulanate] Rash    No medications prior to admission.    Review of Systems  Constitutional: Negative for chills and fever.  HENT: Positive for sore throat.   Respiratory: Negative for cough.   Gastrointestinal: Negative for nausea and vomiting.  Genitourinary: Negative for pelvic pain, vaginal bleeding and vaginal discharge.  Musculoskeletal: Positive for myalgias.   Physical Exam   Blood pressure (!) 125/92, pulse (!) 112, temperature 98.1 F (36.7 C), temperature source Oral, resp. rate 18, weight (!) 142.4 kg, last menstrual period 09/25/2017, SpO2 99 %.  Physical Exam  Nursing note and vitals reviewed. Constitutional: She is oriented  to person, place, and time. She appears well-developed and well-nourished. No distress.  HENT:  Head: Normocephalic.  Cardiovascular: Normal rate.  Respiratory: Effort normal.  GI: Soft. There is no abdominal tenderness.  Neurological: She is alert and oriented to person, place, and time.  Skin: Skin is warm and dry.  Psychiatric: She has a normal mood and affect.  FHT 158 with doppler   Results for orders placed or performed during the hospital encounter of 04/06/18 (from the past 24 hour(s))  Influenza panel by PCR (type A & B)     Status: Abnormal   Collection Time: 04/06/18  1:03 PM  Result Value Ref Range   Influenza A By PCR NEGATIVE NEGATIVE   Influenza B By PCR POSITIVE (A) NEGATIVE  Group A Strep by PCR     Status: None   Collection Time: 04/06/18  1:03 PM  Result Value Ref Range   Group A Strep by PCR NOT DETECTED NOT DETECTED     MAU Course   Procedures  MDM Flu and Strep swabs pending. Patient advised that they will take several hours to result because they are sent to [facility]. Patient will be DC home, and I will call her with results. Phone number verified before patient left.   Assessment and Plan   1. Viral URI with cough   2. Influenza B    DC home Comfort measures reviewed  3rd Trimester precautions  PTL precautions  Fetal kick counts RX: tamiflu 75mg  BID x 5 days, patient advised I will call her with flu results later today when they are available.  Return to MAU as needed FU with OB as planned  Follow-up Information    [facility] Follow up.   Specialty:  Obstetrics and Gynecology Contact information: [address] [place] [address] [phone]          Thressa Sheller DNP, CNM  04/06/18  3:50 PM   3:50 PM Patient called and told flu B was +. Advised to continue current comfort measures and take tamiflu as prescribed.

## 2018-04-06 NOTE — MAU Note (Signed)
Doesn't know what the flu  feels like, but her husband was told he had the flu last wk.  Her throat is sore, face aches, hasn't checked her temp.  Is coughing up nasty green stuff.  Feels like she has been hit by a truck.  Pelvis has been hurting from coughing so much.

## 2018-04-06 NOTE — Discharge Instructions (Signed)

## 2018-04-13 ENCOUNTER — Encounter: Payer: Self-pay | Admitting: General Practice

## 2018-04-13 ENCOUNTER — Ambulatory Visit (INDEPENDENT_AMBULATORY_CARE_PROVIDER_SITE_OTHER): Payer: Medicaid Other | Admitting: Obstetrics and Gynecology

## 2018-04-13 ENCOUNTER — Other Ambulatory Visit (INDEPENDENT_AMBULATORY_CARE_PROVIDER_SITE_OTHER): Payer: Medicaid Other | Admitting: *Deleted

## 2018-04-13 VITALS — BP 137/84 | HR 110 | Wt 317.2 lb

## 2018-04-13 DIAGNOSIS — Z3483 Encounter for supervision of other normal pregnancy, third trimester: Secondary | ICD-10-CM | POA: Diagnosis not present

## 2018-04-13 DIAGNOSIS — Z3480 Encounter for supervision of other normal pregnancy, unspecified trimester: Secondary | ICD-10-CM

## 2018-04-13 NOTE — Progress Notes (Signed)
   PRENATAL VISIT NOTE  Subjective:  Kylie Bailey is a 28 y.o. G2P1001 at [redacted]w[redacted]d being seen today for ongoing prenatal care.  She is currently monitored for the following issues for this low-risk pregnancy and has Stress; Panic attacks; Obesity; Elevated BP without diagnosis of hypertension; Essential hypertension; Seasonal allergies; Bipolar disorder (HCC); Skin tag; Supervision of other normal pregnancy, antepartum; Chronic hypertension during pregnancy, antepartum; and Excessive weight gain during pregnancy in second trimester on their problem list.  Patient reports continued flu symptoms, mainly cough.  Contractions: Not present. Vag. Bleeding: None.  Movement: Present. Denies leaking of fluid. Has been eating less, appetite has been decreased since illness began.  The following portions of the patient's history were reviewed and updated as appropriate: allergies, current medications, past family history, past medical history, past social history, past surgical history and problem list. Problem list updated.  Objective:   Vitals:   04/13/18 0856  BP: 137/84  Pulse: (!) 110  Weight: (!) 143.9 kg    Fetal Status: Fetal Heart Rate (bpm): 150 Fundal Height: 30 cm Movement: Present     General:  Alert, oriented and cooperative. Patient is in no acute distress.  Skin: Skin is warm and dry. No rash noted.   Cardiovascular: Normal heart rate noted  Respiratory: Normal respiratory effort, no problems with respiration noted  Abdomen: Soft, gravid, appropriate for gestational age.  Pain/Pressure: Absent     Pelvic: Cervical exam deferred        Extremities: Normal range of motion.  Edema: None  Mental Status: Normal mood and affect. Normal behavior. Normal judgment and thought content.   Assessment and Plan:  Pregnancy: G2P1001 at [redacted]w[redacted]d  1. Supervision of other normal pregnancy, antepartum - HIV Antibody (routine testing w rflx) - RPR - CBC - Culture, OB Urine - 2hr GTT today -  Advised to continue treating cough symptoms with Mucinex, Robitussin, saline nasal spray and return to urgent care if she becomes febrile. - Reviewed proper diet and nutrition for pregnancy.  Preterm labor symptoms and general obstetric precautions including but not limited to vaginal bleeding, contractions, leaking of fluid and fetal movement were reviewed in detail with the patient. Please refer to After Visit Summary for other counseling recommendations.  Return in about 2 weeks (around 04/27/2018) for ROB.  Future Appointments  Date Time Provider Department Center  04/17/2018 11:15 AM [facility] [place] 4 [facility]    Bernerd Limbo, Student-MidWife

## 2018-04-13 NOTE — Progress Notes (Signed)
   Patient in clinic for 28 week lab work.  Jnaya Butrick L, RN  

## 2018-04-14 LAB — HIV ANTIBODY (ROUTINE TESTING W REFLEX): HIV Screen 4th Generation wRfx: NONREACTIVE

## 2018-04-14 LAB — CBC
HEMATOCRIT: 35.7 % (ref 34.0–46.6)
Hemoglobin: 12.4 g/dL (ref 11.1–15.9)
MCH: 31.6 pg (ref 26.6–33.0)
MCHC: 34.7 g/dL (ref 31.5–35.7)
MCV: 91 fL (ref 79–97)
Platelets: 271 10*3/uL (ref 150–450)
RBC: 3.92 x10E6/uL (ref 3.77–5.28)
RDW: 12.6 % (ref 11.7–15.4)
WBC: 9.9 10*3/uL (ref 3.4–10.8)

## 2018-04-14 LAB — GLUCOSE TOLERANCE, 2 HOURS W/ 1HR
GLUCOSE, 1 HOUR: 153 mg/dL (ref 65–179)
Glucose, 2 hour: 112 mg/dL (ref 65–152)
Glucose, Fasting: 82 mg/dL (ref 65–91)

## 2018-04-14 LAB — RPR: RPR Ser Ql: NONREACTIVE

## 2018-04-15 LAB — CULTURE, OB URINE

## 2018-04-15 LAB — URINE CULTURE, OB REFLEX

## 2018-04-17 ENCOUNTER — Ambulatory Visit (HOSPITAL_COMMUNITY)
Admission: RE | Admit: 2018-04-17 | Discharge: 2018-04-17 | Disposition: A | Discharge status: Home / Self Care | Payer: Medicaid Other | Source: Ambulatory Visit | Attending: Obstetrics and Gynecology | Admitting: Obstetrics and Gynecology

## 2018-04-17 DIAGNOSIS — O99213 Obesity complicating pregnancy, third trimester: Secondary | ICD-10-CM

## 2018-04-17 DIAGNOSIS — O99343 Other mental disorders complicating pregnancy, third trimester: Secondary | ICD-10-CM | POA: Diagnosis not present

## 2018-04-17 DIAGNOSIS — O133 Gestational [pregnancy-induced] hypertension without significant proteinuria, third trimester: Secondary | ICD-10-CM | POA: Diagnosis not present

## 2018-04-17 DIAGNOSIS — O10919 Unspecified pre-existing hypertension complicating pregnancy, unspecified trimester: Secondary | ICD-10-CM | POA: Diagnosis not present

## 2018-04-17 DIAGNOSIS — Z3A29 29 weeks gestation of pregnancy: Secondary | ICD-10-CM | POA: Diagnosis not present

## 2018-04-26 ENCOUNTER — Ambulatory Visit (INDEPENDENT_AMBULATORY_CARE_PROVIDER_SITE_OTHER): Payer: Medicaid Other | Admitting: Obstetrics and Gynecology

## 2018-04-26 VITALS — BP 137/77 | HR 92 | Wt 317.2 lb

## 2018-04-26 DIAGNOSIS — Z3480 Encounter for supervision of other normal pregnancy, unspecified trimester: Secondary | ICD-10-CM

## 2018-04-26 DIAGNOSIS — Z23 Encounter for immunization: Secondary | ICD-10-CM | POA: Diagnosis not present

## 2018-04-26 DIAGNOSIS — Z3483 Encounter for supervision of other normal pregnancy, third trimester: Secondary | ICD-10-CM

## 2018-04-26 NOTE — Progress Notes (Signed)
   PRENATAL VISIT NOTE  Subjective:  Kylie Bailey is a 28 y.o. G2P1001 at [redacted]w[redacted]d being seen today for ongoing prenatal care.  She is currently monitored for the following issues for this low-risk pregnancy and has Stress; Panic attacks; Obesity; Elevated BP without diagnosis of hypertension; Essential hypertension; Seasonal allergies; Bipolar disorder (HCC); Skin tag; Supervision of other normal pregnancy, antepartum; Chronic hypertension during pregnancy, antepartum; and Excessive weight gain during pregnancy in second trimester on their problem list.  Patient reports achiness in her pelvis upon rising in the morning, which goes away when she gets up and moves around. No other complaints.  Contractions: Not present. Vag. Bleeding: None.  Movement: Present. Denies leaking of fluid. Denies popping/clicking with walking, inability to move appropriately, or sharp pains.  The following portions of the patient's history were reviewed and updated as appropriate: allergies, current medications, past family history, past medical history, past social history, past surgical history and problem list. Problem list updated.  Objective:   Vitals:   04/26/18 0933  BP: 137/77  Pulse: 92  Weight: (!) 143.9 kg    Fetal Status: Fetal Heart Rate (bpm): 145 Fundal Height: 32 cm Movement: Present     General:  Alert, oriented and cooperative. Patient is in no acute distress.  Skin: Skin is warm and dry. No rash noted.   Cardiovascular: Normal heart rate noted  Respiratory: Normal respiratory effort, no problems with respiration noted  Abdomen: Soft, gravid, appropriate for gestational age.  Pain/Pressure: Present     Pelvic: Cervical exam deferred        Extremities: Normal range of motion.  Edema: None  Mental Status: Normal mood and affect. Normal behavior. Normal judgment and thought content.   Assessment and Plan:  Pregnancy: G2P1001 at [redacted]w[redacted]d  1. Supervision of other normal pregnancy,  antepartum - Encouraged her to stretch before bedtime and upon rising - Tdap vaccine greater than or equal to 7yo IM  Preterm labor symptoms and general obstetric precautions including but not limited to vaginal bleeding, contractions, leaking of fluid and fetal movement were reviewed in detail with the patient. Please refer to After Visit Summary for other counseling recommendations.   Future Appointments  Date Time Provider Department Center  05/10/2018  9:50 AM Raelyn Mora, CNM [facility] None  05/24/2018 10:10 AM Raelyn Mora, CNM [facility] None  06/07/2018 10:10 AM Raelyn Mora, CNM [facility] None  06/15/2018 10:10 AM Sharyon Cable, CNM [facility] None  06/21/2018 10:10 AM Raelyn Mora, CNM [facility] None  06/29/2018 10:10 AM Raelyn Mora, CNM [facility] None    Bernerd Limbo, Student-MidWife

## 2018-05-10 ENCOUNTER — Other Ambulatory Visit (HOSPITAL_COMMUNITY)
Admission: RE | Admit: 2018-05-10 | Discharge: 2018-05-10 | Disposition: A | Discharge status: Home / Self Care | Payer: Medicaid Other | Source: Ambulatory Visit | Attending: Obstetrics and Gynecology | Admitting: Obstetrics and Gynecology

## 2018-05-10 ENCOUNTER — Ambulatory Visit (INDEPENDENT_AMBULATORY_CARE_PROVIDER_SITE_OTHER): Payer: Medicaid Other | Admitting: Obstetrics and Gynecology

## 2018-05-10 VITALS — BP 127/85 | HR 86 | Wt 320.8 lb

## 2018-05-10 DIAGNOSIS — N898 Other specified noninflammatory disorders of vagina: Secondary | ICD-10-CM | POA: Insufficient documentation

## 2018-05-10 DIAGNOSIS — O26899 Other specified pregnancy related conditions, unspecified trimester: Secondary | ICD-10-CM | POA: Diagnosis not present

## 2018-05-10 DIAGNOSIS — O26893 Other specified pregnancy related conditions, third trimester: Secondary | ICD-10-CM

## 2018-05-10 DIAGNOSIS — O10913 Unspecified pre-existing hypertension complicating pregnancy, third trimester: Secondary | ICD-10-CM

## 2018-05-10 DIAGNOSIS — Z3480 Encounter for supervision of other normal pregnancy, unspecified trimester: Secondary | ICD-10-CM

## 2018-05-10 DIAGNOSIS — O10919 Unspecified pre-existing hypertension complicating pregnancy, unspecified trimester: Secondary | ICD-10-CM

## 2018-05-10 DIAGNOSIS — Z3483 Encounter for supervision of other normal pregnancy, third trimester: Secondary | ICD-10-CM

## 2018-05-10 NOTE — Progress Notes (Addendum)
   PRENATAL VISIT NOTE  Subjective:  Kylie Bailey is a 28 y.o. G2P1001 at 103w3d being seen today for ongoing prenatal care.  She is currently monitored for the following issues for this high-risk pregnancy and has Stress; Panic attacks; Obesity; Elevated BP without diagnosis of hypertension; Essential hypertension; Seasonal allergies; Bipolar disorder (HCC); Skin tag; Supervision of other normal pregnancy, antepartum; Chronic hypertension during pregnancy, antepartum; and Excessive weight gain during pregnancy in second trimester on their problem list.  Patient reports yellow vaginal discharge without irritation.  Contractions: Not present. Vag. Bleeding: None.  Movement: Present. Denies leaking of fluid.   The following portions of the patient's history were reviewed and updated as appropriate: allergies, current medications, past family history, past medical history, past social history, past surgical history and problem list. Problem list updated.  Objective:   Vitals:   05/10/18 0957 05/10/18 1601  BP: (!) 147/98 127/85  Pulse: 86   Weight: (!) 320 lb 12.8 oz (145.5 kg)     Fetal Status: Fetal Heart Rate (bpm): 144 Fundal Height: 33 cm Movement: Present     General:  Alert, oriented and cooperative. Patient is in no acute distress.  Skin: Skin is warm and dry. No rash noted.   Cardiovascular: Normal heart rate noted  Respiratory: Normal respiratory effort, no problems with respiration noted  Abdomen: Soft, gravid, appropriate for gestational age.  Pain/Pressure: Present     Pelvic: Cervical exam deferred       Self-swab collected.  Extremities: Normal range of motion.  Edema: None  Mental Status: Normal mood and affect. Normal behavior. Normal judgment and thought content.   Assessment and Plan:  Pregnancy: G2P1001 at [redacted]w[redacted]d  1. Supervision of other normal pregnancy, antepartum - Discussed upcoming antenatal testing and possibility of IOL at 37 or 40wks depending on her  blood pressure readings.   2. Vaginal discharge during pregnancy, antepartum - Cervicovaginal ancillary only( [place])  3. Chronic hypertension during pregnancy, antepartum - Repeat BP prior to leaving office today was WNL, BP check with RN tomorrow - [place] MFM OB DETAIL +14 WK; Future - Start biweekly NSTs next week, if recommended by MFM  Preterm labor symptoms and general obstetric precautions including but not limited to vaginal bleeding, contractions, leaking of fluid and fetal movement were reviewed in detail with the patient. Please refer to After Visit Summary for other counseling recommendations.  Return in about 2 years (around 05/10/2020) for ROB.  Future Appointments  Date Time Provider Department Center  05/12/2018 10:30 AM [facility] NURSE [facility] None  05/17/2018  1:30 PM [facility] [place] 1 [facility]  05/24/2018 10:10 AM Raelyn Mora, CNM [facility] None  06/07/2018 10:10 AM Raelyn Mora, CNM [facility] None  06/15/2018 10:10 AM Sharyon Cable, CNM [facility] None  06/21/2018 10:10 AM Raelyn Mora, CNM [facility] None  06/29/2018 10:10 AM Raelyn Mora, CNM [facility] None    Raelyn Mora, CNM

## 2018-05-12 ENCOUNTER — Ambulatory Visit (INDEPENDENT_AMBULATORY_CARE_PROVIDER_SITE_OTHER): Payer: Medicaid Other

## 2018-05-12 VITALS — BP 124/81 | HR 94

## 2018-05-12 DIAGNOSIS — Z0130 Encounter for examination of blood pressure without abnormal findings: Secondary | ICD-10-CM

## 2018-05-12 NOTE — Progress Notes (Signed)
Subjective:  Kylie Bailey is a 28 y.o. female here for BP check.   Hypertension ROS: No swelling,chest pain  or SOB.She is currently taking aspirin.    Objective:  LMP 09/25/2017 (Exact Date)   Appearance normal:alert, well appearing, and in no distress. General exam BP noted to be well controlled today in office.    Assessment:   Blood Pressure well-controlled today 124/81 p 94.   Plan:  Follow up at next ob appointment on 05/24/2018.

## 2018-05-14 ENCOUNTER — Other Ambulatory Visit: Payer: Self-pay | Admitting: Certified Nurse Midwife

## 2018-05-14 DIAGNOSIS — J209 Acute bronchitis, unspecified: Secondary | ICD-10-CM

## 2018-05-14 LAB — CERVICOVAGINAL ANCILLARY ONLY
BACTERIAL VAGINITIS: NEGATIVE
CANDIDA VAGINITIS: NEGATIVE
CHLAMYDIA, DNA PROBE: NEGATIVE
Neisseria Gonorrhea: NEGATIVE
TRICH (WINDOWPATH): NEGATIVE

## 2018-05-15 MED ORDER — ALBUTEROL SULFATE HFA 108 (90 BASE) MCG/ACT IN AERS: 1.0000 | INHALATION_SPRAY | Freq: Four times a day (QID) | RESPIRATORY_TRACT | 0 refills | Status: DC | PRN

## 2018-05-17 ENCOUNTER — Other Ambulatory Visit: Payer: Self-pay | Admitting: Obstetrics and Gynecology

## 2018-05-17 ENCOUNTER — Ambulatory Visit (HOSPITAL_COMMUNITY)
Admission: RE | Admit: 2018-05-17 | Discharge: 2018-05-17 | Disposition: A | Discharge status: Home / Self Care | Payer: Medicaid Other | Source: Ambulatory Visit | Attending: Obstetrics and Gynecology | Admitting: Obstetrics and Gynecology

## 2018-05-17 ENCOUNTER — Encounter (HOSPITAL_COMMUNITY): Payer: Self-pay

## 2018-05-17 DIAGNOSIS — Z3A33 33 weeks gestation of pregnancy: Secondary | ICD-10-CM | POA: Diagnosis not present

## 2018-05-17 DIAGNOSIS — O99343 Other mental disorders complicating pregnancy, third trimester: Secondary | ICD-10-CM

## 2018-05-17 DIAGNOSIS — O99213 Obesity complicating pregnancy, third trimester: Secondary | ICD-10-CM

## 2018-05-17 DIAGNOSIS — Z362 Encounter for other antenatal screening follow-up: Secondary | ICD-10-CM

## 2018-05-17 DIAGNOSIS — O133 Gestational [pregnancy-induced] hypertension without significant proteinuria, third trimester: Secondary | ICD-10-CM | POA: Diagnosis not present

## 2018-05-17 DIAGNOSIS — F319 Bipolar disorder, unspecified: Secondary | ICD-10-CM | POA: Diagnosis not present

## 2018-05-17 DIAGNOSIS — Z3480 Encounter for supervision of other normal pregnancy, unspecified trimester: Secondary | ICD-10-CM

## 2018-05-17 DIAGNOSIS — O10919 Unspecified pre-existing hypertension complicating pregnancy, unspecified trimester: Secondary | ICD-10-CM

## 2018-05-17 DIAGNOSIS — O2602 Excessive weight gain in pregnancy, second trimester: Secondary | ICD-10-CM | POA: Diagnosis not present

## 2018-05-18 ENCOUNTER — Other Ambulatory Visit (HOSPITAL_COMMUNITY): Payer: Self-pay | Admitting: *Deleted

## 2018-05-18 DIAGNOSIS — O10919 Unspecified pre-existing hypertension complicating pregnancy, unspecified trimester: Secondary | ICD-10-CM

## 2018-05-24 ENCOUNTER — Other Ambulatory Visit (HOSPITAL_COMMUNITY): Payer: Self-pay | Admitting: Maternal & Fetal Medicine

## 2018-05-24 ENCOUNTER — Ambulatory Visit (INDEPENDENT_AMBULATORY_CARE_PROVIDER_SITE_OTHER): Payer: Medicaid Other | Admitting: Obstetrics and Gynecology

## 2018-05-24 ENCOUNTER — Ambulatory Visit (HOSPITAL_COMMUNITY)
Admission: RE | Admit: 2018-05-24 | Discharge: 2018-05-24 | Disposition: A | Discharge status: Home / Self Care | Payer: Medicaid Other | Source: Ambulatory Visit | Attending: Obstetrics and Gynecology | Admitting: Obstetrics and Gynecology

## 2018-05-24 ENCOUNTER — Other Ambulatory Visit: Payer: Self-pay

## 2018-05-24 ENCOUNTER — Encounter (HOSPITAL_COMMUNITY): Payer: Self-pay

## 2018-05-24 VITALS — BP 131/83 | HR 93 | Wt 321.0 lb

## 2018-05-24 DIAGNOSIS — O133 Gestational [pregnancy-induced] hypertension without significant proteinuria, third trimester: Secondary | ICD-10-CM | POA: Diagnosis not present

## 2018-05-24 DIAGNOSIS — Z3A34 34 weeks gestation of pregnancy: Secondary | ICD-10-CM | POA: Diagnosis not present

## 2018-05-24 DIAGNOSIS — O0993 Supervision of high risk pregnancy, unspecified, third trimester: Secondary | ICD-10-CM

## 2018-05-24 DIAGNOSIS — O99343 Other mental disorders complicating pregnancy, third trimester: Secondary | ICD-10-CM

## 2018-05-24 DIAGNOSIS — O10919 Unspecified pre-existing hypertension complicating pregnancy, unspecified trimester: Secondary | ICD-10-CM | POA: Diagnosis not present

## 2018-05-24 DIAGNOSIS — O10913 Unspecified pre-existing hypertension complicating pregnancy, third trimester: Secondary | ICD-10-CM

## 2018-05-24 DIAGNOSIS — O0990 Supervision of high risk pregnancy, unspecified, unspecified trimester: Secondary | ICD-10-CM

## 2018-05-24 DIAGNOSIS — O99213 Obesity complicating pregnancy, third trimester: Secondary | ICD-10-CM | POA: Diagnosis not present

## 2018-05-24 NOTE — Procedures (Signed)
Kylie Bailey 1991-02-09 [redacted]w[redacted]d  Fetus A Non-Stress Test Interpretation for 05/24/18  Indication: Chronic Hypertenstion  Fetal Heart Rate A Mode: External Baseline Rate (A): 140 bpm Variability: Moderate Accelerations: 15 x 15 Decelerations: None Multiple birth?: No  Uterine Activity Mode: Palpation, Toco Contraction Frequency (min): None Resting Tone Palpated: Relaxed Resting Time: Adequate  Interpretation (Fetal Testing) Nonstress Test Interpretation: Reactive Overall Impression: Reassuring for gestational age Comments: EFM tracing reviewed by Dr. Judeth Cornfield

## 2018-05-24 NOTE — Progress Notes (Signed)
   PRENATAL VISIT NOTE  Subjective:  Kylie Bailey is a 28 y.o. G2P1001 at [redacted]w[redacted]d being seen today for ongoing prenatal care.  She is currently monitored for the following issues for this high-risk pregnancy and has Stress; Panic attacks; Obesity; Elevated BP without diagnosis of hypertension; Essential hypertension; Seasonal allergies; Bipolar disorder (HCC); Skin tag; Supervision of other normal pregnancy, antepartum; Chronic hypertension during pregnancy, antepartum; and Excessive weight gain during pregnancy in second trimester on their problem list.  Patient reports no complaints, has a lot of questions about IOL timing, and the possibility of avoiding an epidural. Also having transportation issues.  Contractions: Irritability. Vag. Bleeding: None.  Movement: Present. Denies leaking of fluid.   The following portions of the patient's history were reviewed and updated as appropriate: allergies, current medications, past family history, past medical history, past social history, past surgical history and problem list. Problem list updated.  Objective:   Vitals:   05/24/18 1017  BP: 131/83  Pulse: 93  Weight: (!) 145.6 kg    Fetal Status: Fetal Heart Rate (bpm): 154 Fundal Height: 35 cm Movement: Present     General:  Alert, oriented and cooperative. Patient is in no acute distress.  Skin: Skin is warm and dry. No rash noted.   Cardiovascular: Normal heart rate noted  Respiratory: Normal respiratory effort, no problems with respiration noted  Abdomen: Soft, gravid, appropriate for gestational age.  Pain/Pressure: Absent     Pelvic: Cervical exam deferred        Extremities: Normal range of motion.  Edema: None  Mental Status: Normal mood and affect. Normal behavior. Normal judgment and thought content.   Assessment and Plan:  Pregnancy: G2P1001 at [redacted]w[redacted]d  1. Supervision of high risk pregnancy, antepartum - Made RN case manager aware of transportation issues  2. Chronic  hypertension affecting pregnancy - Discussed IOL timing, indications, and methods - Encouraged her to watch her diet and engage in daily exercise to keep her HTN under control - Gave general reassurance about labor with IOL, discussed pain relief methods - Continuing biweekly fetal surveillance  Preterm labor symptoms and general obstetric precautions including but not limited to vaginal bleeding, contractions, leaking of fluid and fetal movement were reviewed in detail with the patient. Please refer to After Visit Summary for other counseling recommendations.  Return in about 2 weeks (around 06/07/2018) for ROB.  Future Appointments  Date Time Provider Department Center  05/31/2018 12:45 PM [facility] [place] 2 [facility]  06/07/2018  7:45 AM [facility] [place] 5 [facility]  06/07/2018 10:10 AM Raelyn Mora, CNM [facility] None  06/14/2018 11:00 AM [facility] [place] 3 [facility]  06/15/2018 10:10 AM Sharyon Cable, CNM [facility] None  06/21/2018 10:10 AM Raelyn Mora, CNM [facility] None  06/29/2018 10:10 AM Raelyn Mora, CNM [facility] None    Bernerd Limbo, Student-MidWife

## 2018-05-31 ENCOUNTER — Ambulatory Visit (HOSPITAL_COMMUNITY)
Admission: RE | Admit: 2018-05-31 | Discharge: 2018-05-31 | Disposition: A | Discharge status: Home / Self Care | Payer: Medicaid Other | Source: Ambulatory Visit | Attending: Obstetrics and Gynecology | Admitting: Obstetrics and Gynecology

## 2018-05-31 ENCOUNTER — Encounter (HOSPITAL_COMMUNITY): Payer: Self-pay

## 2018-05-31 ENCOUNTER — Encounter (HOSPITAL_COMMUNITY): Payer: Self-pay | Admitting: *Deleted

## 2018-05-31 ENCOUNTER — Inpatient Hospital Stay (HOSPITAL_COMMUNITY)
Admission: AD | Admit: 2018-05-31 | Discharge: 2018-05-31 | Disposition: A | Discharge status: Home / Self Care | Payer: Medicaid Other | Attending: Obstetrics & Gynecology | Admitting: Obstetrics & Gynecology

## 2018-05-31 ENCOUNTER — Ambulatory Visit (HOSPITAL_COMMUNITY): Payer: Medicaid Other | Admitting: *Deleted

## 2018-05-31 DIAGNOSIS — O10919 Unspecified pre-existing hypertension complicating pregnancy, unspecified trimester: Secondary | ICD-10-CM

## 2018-05-31 DIAGNOSIS — O26893 Other specified pregnancy related conditions, third trimester: Secondary | ICD-10-CM | POA: Insufficient documentation

## 2018-05-31 DIAGNOSIS — Z87891 Personal history of nicotine dependence: Secondary | ICD-10-CM | POA: Insufficient documentation

## 2018-05-31 DIAGNOSIS — O99213 Obesity complicating pregnancy, third trimester: Secondary | ICD-10-CM | POA: Diagnosis not present

## 2018-05-31 DIAGNOSIS — O10013 Pre-existing essential hypertension complicating pregnancy, third trimester: Secondary | ICD-10-CM | POA: Diagnosis not present

## 2018-05-31 DIAGNOSIS — O10913 Unspecified pre-existing hypertension complicating pregnancy, third trimester: Secondary | ICD-10-CM | POA: Diagnosis not present

## 2018-05-31 DIAGNOSIS — R519 Headache, unspecified: Secondary | ICD-10-CM

## 2018-05-31 DIAGNOSIS — F319 Bipolar disorder, unspecified: Secondary | ICD-10-CM | POA: Diagnosis not present

## 2018-05-31 DIAGNOSIS — O99343 Other mental disorders complicating pregnancy, third trimester: Secondary | ICD-10-CM | POA: Diagnosis not present

## 2018-05-31 DIAGNOSIS — Z3480 Encounter for supervision of other normal pregnancy, unspecified trimester: Secondary | ICD-10-CM

## 2018-05-31 DIAGNOSIS — O2602 Excessive weight gain in pregnancy, second trimester: Secondary | ICD-10-CM | POA: Insufficient documentation

## 2018-05-31 DIAGNOSIS — Z3A35 35 weeks gestation of pregnancy: Secondary | ICD-10-CM

## 2018-05-31 DIAGNOSIS — R51 Headache: Secondary | ICD-10-CM | POA: Diagnosis not present

## 2018-05-31 DIAGNOSIS — R1011 Right upper quadrant pain: Secondary | ICD-10-CM | POA: Diagnosis not present

## 2018-05-31 LAB — COMPREHENSIVE METABOLIC PANEL
ALT: 16 U/L (ref 0–44)
ANION GAP: 8 (ref 5–15)
AST: 18 U/L (ref 15–41)
Albumin: 2.6 g/dL — ABNORMAL LOW (ref 3.5–5.0)
Alkaline Phosphatase: 61 U/L (ref 38–126)
BUN: 7 mg/dL (ref 6–20)
CO2: 20 mmol/L — ABNORMAL LOW (ref 22–32)
Calcium: 8.6 mg/dL — ABNORMAL LOW (ref 8.9–10.3)
Chloride: 109 mmol/L (ref 98–111)
Creatinine, Ser: 0.75 mg/dL (ref 0.44–1.00)
GFR calc Af Amer: >60 mL/min (ref >60)
GFR calc non Af Amer: >60 mL/min (ref >60)
Glucose, Bld: 91 mg/dL (ref 70–99)
Potassium: 3.5 mmol/L (ref 3.5–5.1)
Sodium: 137 mmol/L (ref 135–145)
Total Bilirubin: 0.3 mg/dL (ref 0.3–1.2)
Total Protein: 5.5 g/dL — ABNORMAL LOW (ref 6.5–8.1)

## 2018-05-31 LAB — CBC
HCT: 37.7 % (ref 36.0–46.0)
Hemoglobin: 12.7 g/dL (ref 12.0–15.0)
MCH: 30.5 pg (ref 26.0–34.0)
MCHC: 33.7 g/dL (ref 30.0–36.0)
MCV: 90.4 fL (ref 80.0–100.0)
Platelets: 198 10*3/uL (ref 150–400)
RBC: 4.17 MIL/uL (ref 3.87–5.11)
RDW: 13.2 % (ref 11.5–15.5)
WBC: 9.1 10*3/uL (ref 4.0–10.5)
nRBC: 0 % (ref 0.0–0.2)

## 2018-05-31 LAB — PROTEIN / CREATININE RATIO, URINE
Creatinine, Urine: 267.14 mg/dL
PROTEIN CREATININE RATIO: 0.05 mg/mg{creat} (ref 0.00–0.15)
TOTAL PROTEIN, URINE: 13 mg/dL

## 2018-05-31 MED ORDER — BUTALBITAL-APAP-CAFFEINE 50-325-40 MG PO TABS
2.0000 | ORAL_TABLET | Freq: Four times a day (QID) | ORAL | Status: DC | PRN
  Administered 2018-05-31: 2 via ORAL
  Filled 2018-05-31: qty 2

## 2018-05-31 NOTE — [facility]U Provider Note (Signed)
History     CSN: [id number]  Arrival date and time: 05/31/18 1433   First Provider Initiated Contact with Patient 05/31/18 1531      Chief Complaint  Patient presents with  . Hypertension  . Headache  . Abdominal Pain  . Leg Swelling   G2P1001 .3 wks with known CHTN sent from MFM for BP elevation and HA. Reports intermittent frontal HA x2 weeks. HA doesn't respond to Tylenol but does improve with rest. Also reports constant daily black floaters and intermittent RUQ pain. Reports good FM, intermittent BH ctx, no VB or LOF.    OB History    Gravida  2   Para  1   Term  1   Preterm      AB      Living  1     SAB      TAB      Ectopic      Multiple      Live Births  1           Past Medical History:  Diagnosis Date  . Anxiety   . Bulimia   . Depression   . GERD (gastroesophageal reflux disease)   . Hypertension   . Migraines     Past Surgical History:  Procedure Laterality Date  . TONSILLECTOMY    . TYMPANOSTOMY TUBE PLACEMENT Bilateral     Family History  Problem Relation Age of Onset  . Hypertension Mother   . Heart attack Mother   . Drug abuse Father   . High Cholesterol Brother   . Diabetes Paternal Grandmother   . High blood pressure Paternal Grandmother   . Heart disease Paternal Grandmother   . Heart disease Maternal Uncle     Social History   Tobacco Use  . Smoking status: Former Smoker    Packs/day: 0.25    Years: 3.00    Pack years: 0.75    Types: Cigarettes  . Smokeless tobacco: Never Used  . Tobacco comment: working on quitting, at 4/day now - 11/16/17. Trying to quit, 1 cig/day  Substance Use Topics  . Alcohol use: No    Comment: occ  . Drug use: No    Allergies:  Allergies  Allergen Reactions  . Augmentin [Amoxicillin-Pot Clavulanate] Rash    Medications Prior to Admission  Medication Sig Dispense Refill Last Dose  . acetaminophen (TYLENOL) 325 MG tablet Take 650 mg by mouth every 6 (six) hours as needed  for moderate pain.   05/30/2018 at Unknown time  . aspirin 81 MG chewable tablet Chew 1 tablet (81 mg total) by mouth daily. 30 tablet 6 05/30/2018 at Unknown time  . calcium carbonate (TUMS - DOSED IN MG ELEMENTAL CALCIUM) 500 MG chewable tablet Chew 2 tablets by mouth 2 (two) times daily as needed for indigestion or heartburn.   05/31/2018 at Unknown time  . Prenatal MV & Min w/FA-DHA (PRENATAL ADULT GUMMY/DHA/FA) 0.4-25 MG CHEW Chew 1 tablet by mouth daily. 90 tablet 3 05/30/2018 at Unknown time  . albuterol (PROVENTIL HFA;VENTOLIN HFA) 108 (90 Base) MCG/ACT inhaler Inhale 1-2 puffs into the lungs every 6 (six) hours as needed for wheezing or shortness of breath. 1 Inhaler 0 Unknown at Unknown time  . cefdinir (OMNICEF) 300 MG capsule Take 2 capsules (600 mg total) by mouth daily. (Patient not taking: Reported on 04/13/2018) 20 capsule 0 Not Taking  . oseltamivir (TAMIFLU) 75 MG capsule Take 1 capsule (75 mg total) by mouth 2 (two) times daily. (  Patient not taking: Reported on 04/13/2018) 10 capsule 0 Not Taking    Review of Systems  Eyes: Positive for visual disturbance.  Respiratory: Positive for shortness of breath.   Cardiovascular: Negative for chest pain.  Gastrointestinal: Positive for abdominal pain.  Neurological: Positive for headaches.   Physical Exam   Blood pressure 118/73, pulse 92, temperature 98.3 F (36.8 C), temperature source Oral, resp. rate 18, height 5\' 9"  (1.753 m), weight (!) 147 kg, last menstrual period 09/25/2017. Patient Vitals for the past 24 hrs:  BP Temp Temp src Pulse Resp Height Weight  05/31/18 1701 118/73 - - 92 - - -  05/31/18 1646 112/78 - - 93 - - -  05/31/18 1631 116/71 - - 91 - - -  05/31/18 1616 132/70 - - 98 - - -  05/31/18 1601 133/85 - - 100 - - -  05/31/18 1547 122/61 - - 97 - - -  05/31/18 1531 138/71 - - (!) 110 - - -  05/31/18 1521 139/85 - - (!) 111 - - -  05/31/18 1502 138/89 - - (!) 121 - - -  05/31/18 1456 - 98.3 F (36.8 C) Oral - 18 5'  9" (1.753 m) (!) 147 kg    Physical Exam  Constitutional: She is oriented to person, place, and time. She appears well-developed and well-nourished. No distress.  HENT:  Head: Normocephalic and atraumatic.  Neck: Normal range of motion.  Cardiovascular: Normal rate.  Respiratory: Effort normal. No respiratory distress.  GI: Soft. She exhibits no distension. There is no abdominal tenderness.  gravid  Musculoskeletal: Normal range of motion.        General: Edema (trace to LE) present.  Neurological: She is alert and oriented to person, place, and time.  Skin: Skin is warm and dry.  Psychiatric: She has a normal mood and affect.  EFM: 140 bpm, mod variability, + accels, no decels Toco: none  Results for orders placed or performed during the hospital encounter of 05/31/18 (from the past 24 hour(s))  Protein / creatinine ratio, urine     Status: None   Collection Time: 05/31/18  2:53 PM  Result Value Ref Range   Creatinine, Urine 267.14 mg/dL   Total Protein, Urine 13 mg/dL   Protein Creatinine Ratio 0.05 0.00 - 0.15 mg/mg[Cre]  CBC     Status: None   Collection Time: 05/31/18  3:39 PM  Result Value Ref Range   WBC 9.1 4.0 - 10.5 K/uL   RBC 4.17 3.87 - 5.11 MIL/uL   Hemoglobin 12.7 12.0 - 15.0 g/dL   HCT 76.7 34.1 - 93.7 %   MCV 90.4 80.0 - 100.0 fL   MCH 30.5 26.0 - 34.0 pg   MCHC 33.7 30.0 - 36.0 g/dL   RDW 90.2 40.9 - 73.5 %   Platelets 198 150 - 400 K/uL   nRBC 0.0 0.0 - 0.2 %  Comprehensive metabolic panel     Status: Abnormal   Collection Time: 05/31/18  3:39 PM  Result Value Ref Range   Sodium 137 135 - 145 mmol/L   Potassium 3.5 3.5 - 5.1 mmol/L   Chloride 109 98 - 111 mmol/L   CO2 20 (L) 22 - 32 mmol/L   Glucose, Bld 91 70 - 99 mg/dL   BUN 7 6 - 20 mg/dL   Creatinine, Ser 3.29 0.44 - 1.00 mg/dL   Calcium 8.6 (L) 8.9 - 10.3 mg/dL   Total Protein 5.5 (L) 6.5 - 8.1 g/dL  Albumin 2.6 (L) 3.5 - 5.0 g/dL   AST 18 15 - 41 U/L   ALT 16 0 - 44 U/L   Alkaline  Phosphatase 61 38 - 126 U/L   Total Bilirubin 0.3 0.3 - 1.2 mg/dL   GFR calc non Af Amer >60 >60 mL/min   GFR calc Af Amer >60 >60 mL/min   Anion gap 8 5 - 15   MAU Course  Procedures Orders Placed This Encounter  Procedures  . CBC    Standing Status:   Standing    Number of Occurrences:   1  . Comprehensive metabolic panel    Standing Status:   Standing    Number of Occurrences:   1  . Protein / creatinine ratio, urine    Standing Status:   Standing    Number of Occurrences:   1  . Discharge patient    Order Specific Question:   Discharge disposition    Answer:   01-Home or Self Care [1]    Order Specific Question:   Discharge patient date    Answer:   05/31/2018   Meds ordered this encounter  Medications  . butalbital-acetaminophen-caffeine (FIORICET, ESGIC) 50-325-40 MG per tablet 2 tablet   MDM Chart review: pregnancy complicated by [facility] not on meds and obesity. Labs ordered and reviewed. Labs normal, normotensive since arrival. HA slightly improved after meds. Consult with Dr. Jolayne Panther, discussed presentation and clinical findings, recommends expectant mngt and outpt follow up. Stable for discharge home.   Assessment and Plan   1. [redacted] weeks gestation of pregnancy   2. Pregnancy headache in third trimester   3. Chronic hypertension during pregnancy, antepartum    Discharge home [facility] as scheduled Strict PEC precautions Hydrate Rest   Allergies as of 05/31/2018      Reactions   Augmentin [amoxicillin-pot Clavulanate] Rash      Medication List    STOP taking these medications   cefdinir 300 MG capsule Commonly known as:  OMNICEF   oseltamivir 75 MG capsule Commonly known as:  TAMIFLU     TAKE these medications   acetaminophen 325 MG tablet Commonly known as:  TYLENOL Take 650 mg by mouth every 6 (six) hours as needed for moderate pain.   albuterol 108 (90 Base) MCG/ACT inhaler Commonly known as:  PROVENTIL HFA;VENTOLIN HFA Inhale 1-2  puffs into the lungs every 6 (six) hours as needed for wheezing or shortness of breath.   aspirin 81 MG chewable tablet Chew 1 tablet (81 mg total) by mouth daily.   calcium carbonate 500 MG chewable tablet Commonly known as:  TUMS - dosed in mg elemental calcium Chew 2 tablets by mouth 2 (two) times daily as needed for indigestion or heartburn.   Prenatal Adult Gummy/DHA/FA 0.4-25 MG Chew Chew 1 tablet by mouth daily.      Donette Larry, CNM 05/31/2018, 6:04 PM

## 2018-05-31 NOTE — Discharge Instructions (Signed)
Hypertension During Pregnancy ° °Hypertension is also called high blood pressure. High blood pressure means that the force of your blood moving in your body is too strong. When you are pregnant, this condition should be watched carefully. It can cause problems for you and your baby. °Follow these instructions at home: °Eating and drinking ° °· Drink enough fluid to keep your pee (urine) pale yellow. °· Avoid caffeine. °Lifestyle °· Do not use any products that contain nicotine or tobacco, such as cigarettes and e-cigarettes. If you need help quitting, ask your doctor. °· Do not use alcohol or drugs. °· Avoid stress. °· Rest and get plenty of sleep. °General instructions °· Take over-the-counter and prescription medicines only as told by your doctor. °· While lying down, lie on your left side. This keeps pressure off your major blood vessels. °· While sitting or lying down, raise (elevate) your feet. Try putting some pillows under your lower legs. °· Exercise regularly. Ask your doctor what kinds of exercise are best for you. °· Keep all prenatal and follow-up visits as told by your doctor. This is important. °Contact a doctor if: °· You have symptoms that your doctor told you to watch for, such as: °? Throwing up (vomiting). °? Feeling sick to your stomach (nausea). °? Headache. °Get help right away if you have: °· Very bad belly pain that does not get better with treatment. °· A very bad headache that does not get better. °· Throwing up that does not get better with treatment. °· Sudden, fast weight gain. °· Sudden swelling in your hands, ankles, or face. °· Bleeding from your vagina. °· Blood in your pee. °· Fewer movements from your baby than usual. °· Blurry vision. °· Double vision. °· Muscle twitching. °· Sudden muscle tightening (spasms). °· Trouble breathing. °· Blue fingernails or lips. °Summary °· Hypertension is also called high blood pressure. High blood pressure means that the force of your blood moving  in your body is too strong. °· When you are pregnant, this condition should be watched carefully. It can cause problems for you and your baby. °· Get help right away if you have symptoms that your doctor told you to watch for. °This information is not intended to replace advice given to you by your health care provider. Make sure you discuss any questions you have with your health care provider. °Document Released: 04/24/2010 Document Revised: 03/08/2017 Document Reviewed: 12/02/2015 °Elsevier Interactive Patient Education © 2019 [employer] ° °

## 2018-05-31 NOTE — Progress Notes (Signed)
Report called to J. Rana Snare, RN, CN in MAU that patient will be coming to MAU for further evaluation of elevated BP with sxs. Patient stated as she left that she must pick up her child and take her to another location before going to MAU.

## 2018-05-31 NOTE — MAU Note (Signed)
Pt sent from MFM for elevated BP, states she has had swelling up to her knees, RUQ pain intermittently for the last 2 weeks, has HA that doesn't go away with meds, feels like frontal pressure.  Has blurred vision.  Has occasional contractions, denies bleeding or LOF.  Reports good fetal movement.

## 2018-06-05 DIAGNOSIS — F331 Major depressive disorder, recurrent, moderate: Secondary | ICD-10-CM | POA: Diagnosis not present

## 2018-06-07 ENCOUNTER — Ambulatory Visit (HOSPITAL_COMMUNITY): Payer: Medicaid Other | Admitting: *Deleted

## 2018-06-07 ENCOUNTER — Ambulatory Visit (INDEPENDENT_AMBULATORY_CARE_PROVIDER_SITE_OTHER): Payer: Medicaid Other | Admitting: Obstetrics and Gynecology

## 2018-06-07 ENCOUNTER — Other Ambulatory Visit (HOSPITAL_COMMUNITY)
Admission: RE | Admit: 2018-06-07 | Discharge: 2018-06-07 | Disposition: A | Discharge status: Home / Self Care | Payer: Medicaid Other | Source: Ambulatory Visit | Attending: Obstetrics and Gynecology | Admitting: Obstetrics and Gynecology

## 2018-06-07 ENCOUNTER — Ambulatory Visit (HOSPITAL_COMMUNITY)
Admission: RE | Admit: 2018-06-07 | Discharge: 2018-06-07 | Disposition: A | Discharge status: Home / Self Care | Payer: Medicaid Other | Source: Ambulatory Visit | Attending: Obstetrics and Gynecology | Admitting: Obstetrics and Gynecology

## 2018-06-07 ENCOUNTER — Encounter (HOSPITAL_COMMUNITY): Payer: Self-pay

## 2018-06-07 ENCOUNTER — Other Ambulatory Visit: Payer: Self-pay

## 2018-06-07 VITALS — BP 134/83 | HR 96 | Wt 330.2 lb

## 2018-06-07 VITALS — BP 129/80 | HR 97 | Wt 329.2 lb

## 2018-06-07 DIAGNOSIS — F319 Bipolar disorder, unspecified: Secondary | ICD-10-CM | POA: Diagnosis not present

## 2018-06-07 DIAGNOSIS — Z3480 Encounter for supervision of other normal pregnancy, unspecified trimester: Secondary | ICD-10-CM | POA: Insufficient documentation

## 2018-06-07 DIAGNOSIS — O10919 Unspecified pre-existing hypertension complicating pregnancy, unspecified trimester: Secondary | ICD-10-CM | POA: Diagnosis not present

## 2018-06-07 DIAGNOSIS — O99343 Other mental disorders complicating pregnancy, third trimester: Secondary | ICD-10-CM | POA: Diagnosis not present

## 2018-06-07 DIAGNOSIS — E669 Obesity, unspecified: Secondary | ICD-10-CM

## 2018-06-07 DIAGNOSIS — O10013 Pre-existing essential hypertension complicating pregnancy, third trimester: Secondary | ICD-10-CM

## 2018-06-07 DIAGNOSIS — Z3A36 36 weeks gestation of pregnancy: Secondary | ICD-10-CM

## 2018-06-07 DIAGNOSIS — O99213 Obesity complicating pregnancy, third trimester: Secondary | ICD-10-CM | POA: Diagnosis not present

## 2018-06-07 DIAGNOSIS — O133 Gestational [pregnancy-induced] hypertension without significant proteinuria, third trimester: Secondary | ICD-10-CM

## 2018-06-07 NOTE — Progress Notes (Signed)
   PRENATAL VISIT NOTE  Subjective:  Kylie Bailey is a 28 y.o. G2P1001 at [redacted]w[redacted]d being seen today for ongoing prenatal care.  She is currently monitored for the following issues for this high-risk pregnancy and has Stress; Panic attacks; Obesity; Elevated BP without diagnosis of hypertension; Essential hypertension; Seasonal allergies; Bipolar disorder (HCC); Skin tag; Supervision of other normal pregnancy, antepartum; Chronic hypertension during pregnancy, antepartum; and Excessive weight gain during pregnancy in second trimester on their problem list.  Patient reports intermittent headaches with visual disturbances that do not respond to Tylenol.  Contractions: Irritability. Vag. Bleeding: None.  Movement: Present. Denies leaking of fluid. Had questions about the treatments she received in MAU.  The following portions of the patient's history were reviewed and updated as appropriate: allergies, current medications, past family history, past medical history, past social history, past surgical history and problem list. Problem list updated.  Objective:   Vitals:   06/07/18 1029  BP: 134/83  Pulse: 96  Weight: (!) 149.8 kg    Fetal Status: Fetal Heart Rate (bpm): 143 Fundal Height: 39 cm Movement: Present  Presentation: Vertex **FHR unable to be obtained via Doppler due to maternal habitus, documented the HR from her BPP. Able to palpate movement during her exam.**  General:  Alert, oriented and cooperative. Patient is in no acute distress.  Skin: Skin is warm and dry. No rash noted.   Cardiovascular: Normal heart rate noted  Respiratory: Normal respiratory effort, no problems with respiration noted  Abdomen: Soft, gravid, appropriate for gestational age.  Pain/Pressure: Present     Pelvic: Cervical exam performed Dilation: 1.5 Effacement (%): 50 Station: -3  Extremities: Normal range of motion.     Mental Status: Normal mood and affect. Normal behavior. Normal judgment and thought  content.   Assessment and Plan:  Pregnancy: G2P1001 at [redacted]w[redacted]d  1. Supervision of other normal pregnancy, antepartum - Culture, beta strep (group b only) - Cervicovaginal ancillary only( [place])  2. Gestational hypertension, third trimester - BP check on Friday morning - Anticipatory guidance given on possible need for IOL for gHTN - Explained the difference between a general pregnancy headache and a hypertensive headache, reinforced that her treatment in MAU was appropriate, and encouraged her to return to MAU if she has another headache with visual disturbances that does not respond to Tylenol. Emphasized the importance of hypertension management for the safety of her baby. She verbalized understanding and agreed to the plan of care.   Preterm labor symptoms and general obstetric precautions including but not limited to vaginal bleeding, contractions, leaking of fluid and fetal movement were reviewed in detail with the patient. Please refer to After Visit Summary for other counseling recommendations.  Return in about 2 days (around 06/09/2018), or BP check. Future Appointments  Date Time Provider Department Center  06/14/2018 11:00 AM [facility] [place] 3 [facility] M[facility]-US  06/15/2018 10:10 AM Sharyon Cable, CNM [facility] None  06/21/2018 10:10 AM Raelyn Mora, CNM [facility] None  06/29/2018 10:10 AM Raelyn Mora, CNM [facility] None    Bernerd Limbo, Student-MidWife

## 2018-06-08 LAB — CERVICOVAGINAL ANCILLARY ONLY
Chlamydia: NEGATIVE
Neisseria Gonorrhea: NEGATIVE

## 2018-06-10 ENCOUNTER — Encounter: Payer: Self-pay | Admitting: Obstetrics and Gynecology

## 2018-06-11 LAB — CULTURE, BETA STREP (GROUP B ONLY): Strep Gp B Culture: NEGATIVE

## 2018-06-12 ENCOUNTER — Inpatient Hospital Stay (HOSPITAL_COMMUNITY)
Admission: AD | Admit: 2018-06-12 | Discharge: 2018-06-12 | Disposition: A | Discharge status: Home / Self Care | Payer: Medicaid Other | Attending: Obstetrics and Gynecology | Admitting: Obstetrics and Gynecology

## 2018-06-12 ENCOUNTER — Encounter (HOSPITAL_COMMUNITY): Payer: Self-pay | Admitting: *Deleted

## 2018-06-12 ENCOUNTER — Other Ambulatory Visit: Payer: Self-pay

## 2018-06-12 DIAGNOSIS — Z8249 Family history of ischemic heart disease and other diseases of the circulatory system: Secondary | ICD-10-CM | POA: Insufficient documentation

## 2018-06-12 DIAGNOSIS — O163 Unspecified maternal hypertension, third trimester: Secondary | ICD-10-CM | POA: Insufficient documentation

## 2018-06-12 DIAGNOSIS — K219 Gastro-esophageal reflux disease without esophagitis: Secondary | ICD-10-CM | POA: Insufficient documentation

## 2018-06-12 DIAGNOSIS — O99213 Obesity complicating pregnancy, third trimester: Secondary | ICD-10-CM | POA: Insufficient documentation

## 2018-06-12 DIAGNOSIS — Z880 Allergy status to penicillin: Secondary | ICD-10-CM | POA: Diagnosis not present

## 2018-06-12 DIAGNOSIS — F419 Anxiety disorder, unspecified: Secondary | ICD-10-CM | POA: Diagnosis not present

## 2018-06-12 DIAGNOSIS — Z3689 Encounter for other specified antenatal screening: Secondary | ICD-10-CM

## 2018-06-12 DIAGNOSIS — O99343 Other mental disorders complicating pregnancy, third trimester: Secondary | ICD-10-CM | POA: Insufficient documentation

## 2018-06-12 DIAGNOSIS — O9989 Other specified diseases and conditions complicating pregnancy, childbirth and the puerperium: Secondary | ICD-10-CM | POA: Insufficient documentation

## 2018-06-12 DIAGNOSIS — R109 Unspecified abdominal pain: Secondary | ICD-10-CM | POA: Insufficient documentation

## 2018-06-12 DIAGNOSIS — O219 Vomiting of pregnancy, unspecified: Secondary | ICD-10-CM

## 2018-06-12 DIAGNOSIS — O10913 Unspecified pre-existing hypertension complicating pregnancy, third trimester: Secondary | ICD-10-CM | POA: Diagnosis not present

## 2018-06-12 DIAGNOSIS — O99613 Diseases of the digestive system complicating pregnancy, third trimester: Secondary | ICD-10-CM | POA: Insufficient documentation

## 2018-06-12 DIAGNOSIS — Z79899 Other long term (current) drug therapy: Secondary | ICD-10-CM | POA: Insufficient documentation

## 2018-06-12 DIAGNOSIS — F329 Major depressive disorder, single episode, unspecified: Secondary | ICD-10-CM | POA: Diagnosis not present

## 2018-06-12 DIAGNOSIS — Z7982 Long term (current) use of aspirin: Secondary | ICD-10-CM | POA: Insufficient documentation

## 2018-06-12 DIAGNOSIS — O212 Late vomiting of pregnancy: Secondary | ICD-10-CM | POA: Insufficient documentation

## 2018-06-12 DIAGNOSIS — Z3A37 37 weeks gestation of pregnancy: Secondary | ICD-10-CM | POA: Diagnosis not present

## 2018-06-12 DIAGNOSIS — Z87891 Personal history of nicotine dependence: Secondary | ICD-10-CM | POA: Diagnosis not present

## 2018-06-12 DIAGNOSIS — Z833 Family history of diabetes mellitus: Secondary | ICD-10-CM | POA: Insufficient documentation

## 2018-06-12 DIAGNOSIS — O10919 Unspecified pre-existing hypertension complicating pregnancy, unspecified trimester: Secondary | ICD-10-CM

## 2018-06-12 LAB — COMPREHENSIVE METABOLIC PANEL
ALT: 14 U/L (ref 0–44)
AST: 19 U/L (ref 15–41)
Albumin: 2.3 g/dL — ABNORMAL LOW (ref 3.5–5.0)
Alkaline Phosphatase: 69 U/L (ref 38–126)
Anion gap: 7 (ref 5–15)
BUN: 6 mg/dL (ref 6–20)
CALCIUM: 8.6 mg/dL — AB (ref 8.9–10.3)
CO2: 20 mmol/L — ABNORMAL LOW (ref 22–32)
Chloride: 110 mmol/L (ref 98–111)
Creatinine, Ser: 0.74 mg/dL (ref 0.44–1.00)
GFR calc Af Amer: >60 mL/min (ref >60)
GFR calc non Af Amer: >60 mL/min (ref >60)
Glucose, Bld: 105 mg/dL — ABNORMAL HIGH (ref 70–99)
Potassium: 3.3 mmol/L — ABNORMAL LOW (ref 3.5–5.1)
Sodium: 137 mmol/L (ref 135–145)
Total Bilirubin: 0.4 mg/dL (ref 0.3–1.2)
Total Protein: 4.8 g/dL — ABNORMAL LOW (ref 6.5–8.1)

## 2018-06-12 LAB — CBC
HCT: 35.2 % — ABNORMAL LOW (ref 36.0–46.0)
Hemoglobin: 12.2 g/dL (ref 12.0–15.0)
MCH: 31 pg (ref 26.0–34.0)
MCHC: 34.7 g/dL (ref 30.0–36.0)
MCV: 89.6 fL (ref 80.0–100.0)
Platelets: 177 10*3/uL (ref 150–400)
RBC: 3.93 MIL/uL (ref 3.87–5.11)
RDW: 13.1 % (ref 11.5–15.5)
WBC: 8.8 10*3/uL (ref 4.0–10.5)
nRBC: 0 % (ref 0.0–0.2)

## 2018-06-12 LAB — PROTEIN / CREATININE RATIO, URINE
Creatinine, Urine: 226.2 mg/dL
Protein Creatinine Ratio: 0.11 mg/mg{Cre} (ref 0.00–0.15)
Total Protein, Urine: 24 mg/dL

## 2018-06-12 MED ORDER — ONDANSETRON HCL 4 MG PO TABS
4.0000 mg | ORAL_TABLET | Freq: Three times a day (TID) | ORAL | Status: DC | PRN
  Administered 2018-06-12: 4 mg via ORAL
  Filled 2018-06-12: qty 1

## 2018-06-12 MED ORDER — ONDANSETRON HCL 4 MG PO TABS: 4.0000 mg | ORAL_TABLET | Freq: Three times a day (TID) | ORAL | 0 refills | Status: DC | PRN

## 2018-06-12 NOTE — MAU Note (Signed)
Was here a couple wks ago for HA, was told if she started having HA, swelling, upper abd pain to come back in .  Has been really nauseous, HA that comes and goes, Tylenol doesn't help.  No ha right now, feels "kind of clouding".  Has pain in abd that comes and goes.  Doesn't feel well.

## 2018-06-12 NOTE — MAU Provider Note (Signed)
History     CSN: [id number]  Arrival date and time: 06/12/18 1045   First Provider Initiated Contact with Patient 06/12/18 1137      Chief Complaint  Patient presents with  . Nausea  . Abdominal Pain   27 y.o. G2P1001 @37 .1 wks presenting with nausea. Reports onset 3 days ago. No emesis. No sick contacts. No diarrhea. No fever. Reports intermittent HA and seeing floaters for the last several weeks. Denies CP and SOB. Reports good FM. No LOF, VB, and ctx. Her pregnancy has been complicated by morbid obesity, CHTN not on meds, and Bipolar.   OB History    Gravida  2   Para  1   Term  1   Preterm      AB      Living  1     SAB      TAB      Ectopic      Multiple      Live Births  1           Past Medical History:  Diagnosis Date  . Anxiety   . Bulimia   . Depression   . GERD (gastroesophageal reflux disease)   . Hypertension   . Migraines     Past Surgical History:  Procedure Laterality Date  . TONSILLECTOMY    . TYMPANOSTOMY TUBE PLACEMENT Bilateral     Family History  Problem Relation Age of Onset  . Hypertension Mother   . Heart attack Mother   . Drug abuse Father   . High Cholesterol Brother   . Diabetes Paternal Grandmother   . High blood pressure Paternal Grandmother   . Heart disease Paternal Grandmother   . Heart disease Maternal Uncle     Social History   Tobacco Use  . Smoking status: Former Smoker    Packs/day: 0.25    Years: 3.00    Pack years: 0.75    Types: Cigarettes  . Smokeless tobacco: Never Used  . Tobacco comment: working on quitting, at 4/day now - 11/16/17. Trying to quit, 1 cig/day  Substance Use Topics  . Alcohol use: No    Comment: occ  . Drug use: No    Allergies:  Allergies  Allergen Reactions  . Augmentin [Amoxicillin-Pot Clavulanate] Rash    Medications Prior to Admission  Medication Sig Dispense Refill Last Dose  . acetaminophen (TYLENOL) 325 MG tablet Take 650 mg by mouth every 6 (six) hours as  needed for moderate pain.   Taking  . albuterol (PROVENTIL HFA;VENTOLIN HFA) 108 (90 Base) MCG/ACT inhaler Inhale 1-2 puffs into the lungs every 6 (six) hours as needed for wheezing or shortness of breath. 1 Inhaler 0 Taking  . aspirin 81 MG chewable tablet Chew 1 tablet (81 mg total) by mouth daily. 30 tablet 6 Taking  . calcium carbonate (TUMS - DOSED IN MG ELEMENTAL CALCIUM) 500 MG chewable tablet Chew 2 tablets by mouth 2 (two) times daily as needed for indigestion or heartburn.   Taking  . Prenatal MV & Min w/FA-DHA (PRENATAL ADULT GUMMY/DHA/FA) 0.4-25 MG CHEW Chew 1 tablet by mouth daily. 90 tablet 3 Taking    Review of Systems  Constitutional: Negative for fever.  Eyes: Positive for visual disturbance.  Respiratory: Negative for shortness of breath.   Cardiovascular: Negative for chest pain.  Gastrointestinal: Positive for nausea. Negative for abdominal pain, diarrhea and vomiting.  Neurological: Positive for headaches.   Physical Exam   Blood pressure 125/70, pulse 82, temperature  97.9 F (36.6 C), temperature source Oral, resp. rate 18, height  (1.753 m), weight (!) 152 kg, last menstrual period 09/25/2017, SpO2 99 %. Patient Vitals for the past 24 hrs:  BP Temp Temp src Pulse Resp SpO2 Height Weight  06/12/18 1317 125/70 - - 82 - - - -  06/12/18 1302 117/77 - - 88 - - - -  06/12/18 1247 118/81 - - 88 - - - -  06/12/18 1232 (!) 116/52 - - (!) 125 - - - -  06/12/18 1217 120/83 - - 88 - - - -  06/12/18 1213 (!) 115/91 - - 91 - - - -  06/12/18 1150 140/90 - - (!) 116 - - - -  06/12/18 1135 137/77 - - 91 - - - -  06/12/18 1110 (!) 145/80 97.9 F (36.6 C) Oral 89 18 99 %  (1.753 m) (!) 152 kg   Physical Exam  Constitutional: She is oriented to person, place, and time. She appears well-developed and well-nourished. No distress.  HENT:  Head: Normocephalic and atraumatic.  Neck: Normal range of motion.  Cardiovascular: Normal rate.  Respiratory: Effort normal. No  respiratory distress.  GI: Soft. She exhibits no distension. There is no abdominal tenderness.  gravid  Musculoskeletal: Normal range of motion.        General: Edema (1+ BLE) present.  Neurological: She is alert and oriented to person, place, and time.  Skin: Skin is warm and dry.  Psychiatric: She has a normal mood and affect.  EFM: 145 bpm, mod variability, + accels, no decels Toco: rare  Results for orders placed or performed during the hospital encounter of 06/12/18 (from the past 24 hour(s))  CBC     Status: Abnormal   Collection Time: 06/12/18 12:23 PM  Result Value Ref Range   WBC 8.8 4.0 - 10.5 K/uL   RBC 3.93 3.87 - 5.11 MIL/uL   Hemoglobin 12.2 12.0 - 15.0 g/dL   HCT 16.1 (L) 09.6 - 04.5 %   MCV 89.6 80.0 - 100.0 fL   MCH 31.0 26.0 - 34.0 pg   MCHC 34.7 30.0 - 36.0 g/dL   RDW 40.9 81.1 - 91.4 %   Platelets 177 150 - 400 K/uL   nRBC 0.0 0.0 - 0.2 %  Comprehensive metabolic panel     Status: Abnormal   Collection Time: 06/12/18 12:23 PM  Result Value Ref Range   Sodium 137 135 - 145 mmol/L   Potassium 3.3 (L) 3.5 - 5.1 mmol/L   Chloride 110 98 - 111 mmol/L   CO2 20 (L) 22 - 32 mmol/L   Glucose, Bld 105 (H) 70 - 99 mg/dL   BUN 6 6 - 20 mg/dL   Creatinine, Ser 7.82 0.44 - 1.00 mg/dL   Calcium 8.6 (L) 8.9 - 10.3 mg/dL   Total Protein 4.8 (L) 6.5 - 8.1 g/dL   Albumin 2.3 (L) 3.5 - 5.0 g/dL   AST 19 15 - 41 U/L   ALT 14 0 - 44 U/L   Alkaline Phosphatase 69 38 - 126 U/L   Total Bilirubin 0.4 0.3 - 1.2 mg/dL   GFR calc non Af Amer >60 >60 mL/min   GFR calc Af Amer >60 >60 mL/min   Anion gap 7 5 - 15  Protein / creatinine ratio, urine     Status: None   Collection Time: 06/12/18 12:25 PM  Result Value Ref Range   Creatinine, Urine 226.20 mg/dL   Total Protein, Urine  24 mg/dL   Protein Creatinine Ratio 0.11 0.00 - 0.15 mg/mg[Cre]    MAU Course  Procedures Orders Placed This Encounter  Procedures  . CBC    Standing Status:   Standing    Number of Occurrences:    1  . Comprehensive metabolic panel    Standing Status:   Standing    Number of Occurrences:   1  . Protein / creatinine ratio, urine    Standing Status:   Standing    Number of Occurrences:   1  . Discharge patient    Order Specific Question:   Discharge disposition    Answer:   01-Home or Self Care [1]    Order Specific Question:   Discharge patient date    Answer:   06/12/2018   Meds ordered this encounter  Medications  . ondansetron (ZOFRAN) tablet 4 mg  . ondansetron (ZOFRAN) 4 MG tablet    Sig: Take 1 tablet (4 mg total) by mouth every 8 (eight) hours as needed for nausea or vomiting.    Dispense:  20 tablet    Refill:  0    Order Specific Question:   Supervising Provider    Answer:   CONSTANT, PEGGY [[id number]]   MDM Labs ordered and reviewed. No evidence of PEC. Unclear etiology of nausea. Stable for discharge home.   Assessment and Plan   1. [redacted] weeks gestation of pregnancy   2. NST (non-stress test) reactive   3. Vomiting or nausea of pregnancy   4. Chronic hypertension during pregnancy, antepartum    Discharge home Follow up at [facility] this week as scheduled Rx Zofran PEC precautions  Allergies as of 06/12/2018      Reactions   Augmentin [amoxicillin-pot Clavulanate] Rash      Medication List    TAKE these medications   acetaminophen 325 MG tablet Commonly known as:  TYLENOL Take 650 mg by mouth every 6 (six) hours as needed for moderate pain.   albuterol 108 (90 Base) MCG/ACT inhaler Commonly known as:  PROVENTIL HFA;VENTOLIN HFA Inhale 1-2 puffs into the lungs every 6 (six) hours as needed for wheezing or shortness of breath.   aspirin 81 MG chewable tablet Chew 1 tablet (81 mg total) by mouth daily.   calcium carbonate 500 MG chewable tablet Commonly known as:  TUMS - dosed in mg elemental calcium Chew 2 tablets by mouth 2 (two) times daily as needed for indigestion or heartburn.   ondansetron 4 MG tablet Commonly known as:  ZOFRAN Take 1 tablet (4  mg total) by mouth every 8 (eight) hours as needed for nausea or vomiting.   Prenatal Adult Gummy/DHA/FA 0.4-25 MG Chew Chew 1 tablet by mouth daily.      Donette Larry, CNM 06/12/2018, 2:20 PM

## 2018-06-12 NOTE — Discharge Instructions (Signed)
Hypertension During Pregnancy ° °Hypertension is also called high blood pressure. High blood pressure means that the force of your blood moving in your body is too strong. When you are pregnant, this condition should be watched carefully. It can cause problems for you and your baby. °Follow these instructions at home: °Eating and drinking ° °· Drink enough fluid to keep your pee (urine) pale yellow. °· Avoid caffeine. °Lifestyle °· Do not use any products that contain nicotine or tobacco, such as cigarettes and e-cigarettes. If you need help quitting, ask your doctor. °· Do not use alcohol or drugs. °· Avoid stress. °· Rest and get plenty of sleep. °General instructions °· Take over-the-counter and prescription medicines only as told by your doctor. °· While lying down, lie on your left side. This keeps pressure off your major blood vessels. °· While sitting or lying down, raise (elevate) your feet. Try putting some pillows under your lower legs. °· Exercise regularly. Ask your doctor what kinds of exercise are best for you. °· Keep all prenatal and follow-up visits as told by your doctor. This is important. °Contact a doctor if: °· You have symptoms that your doctor told you to watch for, such as: °? Throwing up (vomiting). °? Feeling sick to your stomach (nausea). °? Headache. °Get help right away if you have: °· Very bad belly pain that does not get better with treatment. °· A very bad headache that does not get better. °· Throwing up that does not get better with treatment. °· Sudden, fast weight gain. °· Sudden swelling in your hands, ankles, or face. °· Bleeding from your vagina. °· Blood in your pee. °· Fewer movements from your baby than usual. °· Blurry vision. °· Double vision. °· Muscle twitching. °· Sudden muscle tightening (spasms). °· Trouble breathing. °· Blue fingernails or lips. °Summary °· Hypertension is also called high blood pressure. High blood pressure means that the force of your blood moving  in your body is too strong. °· When you are pregnant, this condition should be watched carefully. It can cause problems for you and your baby. °· Get help right away if you have symptoms that your doctor told you to watch for. °This information is not intended to replace advice given to you by your health care provider. Make sure you discuss any questions you have with your health care provider. °Document Released: 04/24/2010 Document Revised: 03/08/2017 Document Reviewed: 12/02/2015 °Elsevier Interactive Patient Education © 2019 [employer] ° °

## 2018-06-14 ENCOUNTER — Other Ambulatory Visit: Payer: Self-pay

## 2018-06-14 ENCOUNTER — Encounter (HOSPITAL_COMMUNITY): Payer: Self-pay

## 2018-06-14 ENCOUNTER — Ambulatory Visit (HOSPITAL_COMMUNITY)
Admission: RE | Admit: 2018-06-14 | Discharge: 2018-06-14 | Disposition: A | Discharge status: Home / Self Care | Payer: Medicaid Other | Source: Ambulatory Visit | Attending: Obstetrics and Gynecology | Admitting: Obstetrics and Gynecology

## 2018-06-14 ENCOUNTER — Other Ambulatory Visit (HOSPITAL_COMMUNITY): Payer: Self-pay | Admitting: *Deleted

## 2018-06-14 ENCOUNTER — Ambulatory Visit (HOSPITAL_COMMUNITY): Payer: Medicaid Other | Admitting: *Deleted

## 2018-06-14 VITALS — BP 135/78 | HR 102 | Wt 334.2 lb

## 2018-06-14 DIAGNOSIS — O10919 Unspecified pre-existing hypertension complicating pregnancy, unspecified trimester: Secondary | ICD-10-CM | POA: Insufficient documentation

## 2018-06-14 DIAGNOSIS — O99343 Other mental disorders complicating pregnancy, third trimester: Secondary | ICD-10-CM

## 2018-06-14 DIAGNOSIS — O10013 Pre-existing essential hypertension complicating pregnancy, third trimester: Secondary | ICD-10-CM

## 2018-06-14 DIAGNOSIS — Z362 Encounter for other antenatal screening follow-up: Secondary | ICD-10-CM | POA: Diagnosis not present

## 2018-06-14 DIAGNOSIS — O99213 Obesity complicating pregnancy, third trimester: Secondary | ICD-10-CM

## 2018-06-14 DIAGNOSIS — F319 Bipolar disorder, unspecified: Secondary | ICD-10-CM

## 2018-06-14 DIAGNOSIS — Z3A37 37 weeks gestation of pregnancy: Secondary | ICD-10-CM

## 2018-06-15 ENCOUNTER — Ambulatory Visit (INDEPENDENT_AMBULATORY_CARE_PROVIDER_SITE_OTHER): Payer: Medicaid Other | Admitting: Certified Nurse Midwife

## 2018-06-15 VITALS — BP 125/84 | HR 85 | Temp 98.0°F | Wt 332.6 lb

## 2018-06-15 DIAGNOSIS — R110 Nausea: Secondary | ICD-10-CM

## 2018-06-15 DIAGNOSIS — Z3A37 37 weeks gestation of pregnancy: Secondary | ICD-10-CM

## 2018-06-15 DIAGNOSIS — Z3483 Encounter for supervision of other normal pregnancy, third trimester: Secondary | ICD-10-CM

## 2018-06-15 MED ORDER — DICLEGIS 10-10 MG PO TBEC: 2.0000 | DELAYED_RELEASE_TABLET | Freq: Every day | ORAL | 0 refills | Status: DC

## 2018-06-15 NOTE — Progress Notes (Signed)
   PRENATAL VISIT NOTE  Subjective:  Kylie Bailey is a 28 y.o. G2P1001 at [redacted]w[redacted]d being seen today for ongoing prenatal care.  She is currently monitored for the following issues for this low-risk pregnancy and has Stress; Panic attacks; Obesity; Elevated BP without diagnosis of hypertension; Essential hypertension; Seasonal allergies; Bipolar disorder (HCC); Skin tag; Supervision of other normal pregnancy, antepartum; Chronic hypertension during pregnancy, antepartum; and Excessive weight gain during pregnancy in second trimester on their problem list.  Patient reports increased nausea over the past week. She feels she's trying to stay hydrated and eating regularly, but nothing helps the nausea. She hasn't taken anything for it.  Contractions: Irregular. Vag. Bleeding: None.  Movement: Present. Denies leaking of fluid.   The following portions of the patient's history were reviewed and updated as appropriate: allergies, current medications, past family history, past medical history, past social history, past surgical history and problem list. Problem list updated.  Objective:   Vitals:   06/15/18 1022  BP: 125/84  Pulse: 85  Temp: 98 F (36.7 C)  Weight: (!) 150.9 kg    Fetal Status: Fetal Heart Rate (bpm): 135 Fundal Height: 38 cm Movement: Present     General:  Alert, oriented and cooperative. Patient is in no acute distress.  Skin: Skin is warm and dry. No rash noted.   Cardiovascular: Normal heart rate noted  Respiratory: Normal respiratory effort, no problems with respiration noted  Abdomen: Soft, gravid, appropriate for gestational age.  Pain/Pressure: Present     Pelvic: Cervical exam deferred        Extremities: Normal range of motion.     Mental Status: Normal mood and affect. Normal behavior. Normal judgment and thought content.   Assessment and Plan:  Pregnancy: G2P1001 at [redacted]w[redacted]d  1. Encounter for supervision of other normal pregnancy in third trimester - Provided  reassurance, discussed end-of-pregnancy symptoms  2. Nausea - Diclegis sent to pharmacy  Term labor symptoms and general obstetric precautions including but not limited to vaginal bleeding, contractions, leaking of fluid and fetal movement were reviewed in detail with the patient. Please refer to After Visit Summary for other counseling recommendations.  Return in about 1 week (around 06/22/2018) for ROB.  Future Appointments  Date Time Provider Department Center  06/20/2018  3:45 PM [facility] [place] 2 [facility]  06/21/2018 10:10 AM Raelyn Mora, CNM [facility] None  06/27/2018  2:00 PM [facility] [place] 3 [facility]  06/29/2018 10:10 AM Raelyn Mora, CNM [facility] None    Bernerd Limbo, Student[facility]ife

## 2018-06-20 ENCOUNTER — Other Ambulatory Visit: Payer: Self-pay

## 2018-06-20 ENCOUNTER — Ambulatory Visit (HOSPITAL_COMMUNITY): Payer: Medicaid Other | Admitting: *Deleted

## 2018-06-20 ENCOUNTER — Encounter (HOSPITAL_COMMUNITY): Payer: Self-pay

## 2018-06-20 ENCOUNTER — Ambulatory Visit (HOSPITAL_COMMUNITY)
Admission: RE | Admit: 2018-06-20 | Discharge: 2018-06-20 | Disposition: A | Discharge status: Home / Self Care | Payer: Medicaid Other | Source: Ambulatory Visit | Attending: Obstetrics and Gynecology | Admitting: Obstetrics and Gynecology

## 2018-06-20 VITALS — BP 136/81 | HR 82 | Temp 98.5°F | Wt 332.1 lb

## 2018-06-20 DIAGNOSIS — F319 Bipolar disorder, unspecified: Secondary | ICD-10-CM

## 2018-06-20 DIAGNOSIS — Z3A38 38 weeks gestation of pregnancy: Secondary | ICD-10-CM

## 2018-06-20 DIAGNOSIS — O99213 Obesity complicating pregnancy, third trimester: Secondary | ICD-10-CM | POA: Diagnosis not present

## 2018-06-20 DIAGNOSIS — Z3480 Encounter for supervision of other normal pregnancy, unspecified trimester: Secondary | ICD-10-CM | POA: Insufficient documentation

## 2018-06-20 DIAGNOSIS — O10919 Unspecified pre-existing hypertension complicating pregnancy, unspecified trimester: Secondary | ICD-10-CM | POA: Diagnosis not present

## 2018-06-20 DIAGNOSIS — O2602 Excessive weight gain in pregnancy, second trimester: Secondary | ICD-10-CM

## 2018-06-20 DIAGNOSIS — O99343 Other mental disorders complicating pregnancy, third trimester: Secondary | ICD-10-CM

## 2018-06-20 DIAGNOSIS — O10013 Pre-existing essential hypertension complicating pregnancy, third trimester: Secondary | ICD-10-CM

## 2018-06-21 ENCOUNTER — Other Ambulatory Visit: Payer: Self-pay | Admitting: Certified Nurse Midwife

## 2018-06-21 ENCOUNTER — Other Ambulatory Visit: Payer: Self-pay | Admitting: Family Medicine

## 2018-06-21 ENCOUNTER — Encounter: Payer: Self-pay | Admitting: Obstetrics and Gynecology

## 2018-06-21 ENCOUNTER — Encounter (HOSPITAL_COMMUNITY): Payer: Self-pay | Admitting: *Deleted

## 2018-06-21 ENCOUNTER — Ambulatory Visit (INDEPENDENT_AMBULATORY_CARE_PROVIDER_SITE_OTHER): Payer: Medicaid Other | Admitting: Obstetrics and Gynecology

## 2018-06-21 ENCOUNTER — Telehealth (HOSPITAL_COMMUNITY): Payer: Self-pay | Admitting: *Deleted

## 2018-06-21 ENCOUNTER — Encounter: Payer: Self-pay | Admitting: Family Medicine

## 2018-06-21 VITALS — BP 138/85 | HR 87 | Temp 97.4°F | Wt 332.0 lb

## 2018-06-21 DIAGNOSIS — O10919 Unspecified pre-existing hypertension complicating pregnancy, unspecified trimester: Secondary | ICD-10-CM

## 2018-06-21 DIAGNOSIS — O10913 Unspecified pre-existing hypertension complicating pregnancy, third trimester: Secondary | ICD-10-CM

## 2018-06-21 DIAGNOSIS — J209 Acute bronchitis, unspecified: Secondary | ICD-10-CM

## 2018-06-21 DIAGNOSIS — Z3A38 38 weeks gestation of pregnancy: Secondary | ICD-10-CM

## 2018-06-21 DIAGNOSIS — Z3480 Encounter for supervision of other normal pregnancy, unspecified trimester: Secondary | ICD-10-CM

## 2018-06-21 MED ORDER — FLUTICASONE PROPIONATE 50 MCG/ACT NA SUSP: 1.0000 | Freq: Every day | NASAL | 2 refills | Status: DC

## 2018-06-21 MED ORDER — LORATADINE 10 MG PO TABS: 10.0000 mg | ORAL_TABLET | Freq: Every day | ORAL | 11 refills | Status: DC

## 2018-06-21 NOTE — Progress Notes (Signed)
   PRENATAL VISIT NOTE  Subjective:  Kylie Bailey is a 28 y.o. G2P1001 at [redacted]w[redacted]d being seen today for ongoing prenatal care.  She is currently monitored for the following issues for this low-risk pregnancy and has Stress; Panic attacks; Obesity; Elevated BP without diagnosis of hypertension; Essential hypertension; Seasonal allergies; Bipolar disorder (HCC); Skin tag; Supervision of other normal pregnancy, antepartum; Chronic hypertension during pregnancy, antepartum; and Excessive weight gain during pregnancy in second trimester on their problem list.  Patient reports pelvic pressure.  Contractions: Not present. Vag. Bleeding: None.  Movement: Present. Denies leaking of fluid.   The following portions of the patient's history were reviewed and updated as appropriate: allergies, current medications, past family history, past medical history, past social history, past surgical history and problem list.   Objective:   Vitals:   06/21/18 1021  BP: 138/85  Pulse: 87  Temp: (!) 97.4 F (36.3 C)  Weight: (!) 332 lb (150.6 kg)    Fetal Status: Fetal Heart Rate (bpm): 156 Fundal Height: 44 cm Movement: Present     General:  Alert, oriented and cooperative. Patient is in no acute distress.  Skin: Skin is warm and dry. No rash noted.   Cardiovascular: Normal heart rate noted  Respiratory: Normal respiratory effort, no problems with respiration noted  Abdomen: Soft, gravid, appropriate for gestational age.  Pain/Pressure: Present     Pelvic: Cervical exam deferred        Extremities: Normal range of motion.     Mental Status: Normal mood and affect. Normal behavior. Normal judgment and thought content.   Assessment and Plan:  Pregnancy: G2P1001 at [redacted]w[redacted]d Chronic hypertension during pregnancy, antepartum - Stopped taking bASA @ 36 wks because she "ran out" - IOL scheduled for 40 wks Sunday 07/02/2018 @ 0730    Term labor symptoms and general obstetric precautions including but not  limited to vaginal bleeding, contractions, leaking of fluid and fetal movement were reviewed in detail with the patient. Please refer to After Visit Summary for other counseling recommendations.   Return in about 1 week (around 06/28/2018) for Return OB visit.  Future Appointments  Date Time Provider Department Center  06/27/2018  1:45 PM [facility] NURSE [facility]  06/27/2018  2:00 PM [facility] [place] 3 [facility]  06/29/2018 10:10 AM Raelyn Mora, CNM [facility] None  07/02/2018  7:30 AM [facility] SCHED ROOM [facility] None    Raelyn Mora, CNM

## 2018-06-21 NOTE — Telephone Encounter (Signed)
Preadmission screen  

## 2018-06-21 NOTE — Patient Instructions (Signed)
Balloon Catheter Placement for Cervical Ripening Balloon catheter placement for cervical ripening is a procedure to help your cervix start to soften (ripen) and open (dilate). It is done to prepare your body for labor induction. During this procedure, a thin tube (catheter) is placed through your cervix. A tiny balloon attached to the catheter is inflated with water. Pressure from the balloon is what helps your cervix start to open. Cervical ripening with a balloon catheter can make labor induction shorter and easier. You may have this procedure if:  Your cervix is not ready for labor.  Your health care provider has planned labor induction.  You are not having twins or multiples.  Your baby is in the head-down position.  You do not have any other pregnancy complications that require you to be monitored in the hospital after balloon catheter placement. If your health care provider has recommended labor induction to stimulate a vaginal birth, this procedure may be started the day before induction. You will go home with the balloon in place and return to start induction in 12-24 hours. You may have this procedure and stay in the hospital so that your progress can be monitored as well. Tell a health care provider about:  Any allergies you have.  All medicines you are taking, including vitamins, herbs, eye drops, creams, and over-the-counter medicines.  Any blood disorders you have.  Any surgeries you have had.  Any medical conditions you have. What are the risks? Generally, this is a safe procedure. However, problems may occur, including:  Infection.  Bleeding.  Cramping or pain.  Difficulty passing urine.  The baby moving from the head-down position to a position with the feet or buttocks down (breech position). What happens before the procedure?  Your health care provider may check your baby's heartbeat (fetal monitoring) before the procedure.  You may be asked to empty your  bladder. What happens during the procedure?   You will be positioned on the exam table as if you were having a pelvic exam or Pap test.  Your health care provider may insert a medical instrument into your vagina (speculum) to see your cervix.  Your cervix may be cleaned with a germ-killing solution.  The catheter will be inserted through the opening of your cervix.  A balloon on the end of the catheter will be inflated with sterile water. Some catheters have two balloons, one on each side of the cervix.  Depending on the type of balloon catheter, the end of the catheter may be left free outside your cervix or taped to your leg. The procedure may vary among health care providers and hospitals. What can I expect after the procedure?  After the procedure, it is common to have: ? A feeling of pressure inside your vagina. ? Light vaginal bleeding (spotting).  You may have fetal monitoring before going home.  You may be sent home with the catheter in place and asked to return to start your induction in about 12-24 hours. Follow these instructions at home:  Take over-the-counter and prescription medicines only as told by your health care provider.  Return to your normal activities at home as told by your health care provider. Ask your health care provider what activities are safe for you. Do not leave home until you return for labor induction.  You may shower at home. Do not take baths, swim, or use a hot tub unless your health care provider approves.  As your cervix opens, your catheter and balloon may   fall out before you return for labor induction. Ask your health care provider what you should do if this happens.  Keep all follow-up visits as told by your health care provider. This is important. You will need to return to start induction as told by your health care provider. Contact your health care provider if:  You have chills or a fever.  You have constant pain or cramps (not  contractions).  You have trouble passing urine.  Your water breaks.  You have vaginal bleeding that is heavier than spotting.  You have contractions that start to last longer and come closer together (about every 5 minutes).  The balloon catheter falls out before you return to start your induction. Summary  Cervical ripening with placement of a balloon catheter is an outpatient procedure to prepare you for labor induction.  Cervical ripening with a balloon catheter helps your cervix start to open for birth.  You will be positioned on the exam table. The catheter will be inserted through the opening of your cervix. A balloon on the end of the catheter will be inflated with water.  Pressure from the balloon will cause ripening of your cervix. You will go home with the balloon in place and return to start induction in 12-24 hours.  Contact your health care provider if you have pain, fever, vaginal bleeding, or trouble passing urine. Also contact him or her if your water breaks, you start to go into labor, or your balloon catheter falls out before you return to start your induction. This information is not intended to replace advice given to you by your health care provider. Make sure you discuss any questions you have with your health care provider. Document Released: 11/18/2017 Document Revised: 11/18/2017 Document Reviewed: 11/18/2017 Elsevier Interactive Patient Education  2019 [employer]. Labor Induction  Labor induction is when steps are taken to cause a pregnant woman to begin the labor process. Most women go into labor on their own between 37 weeks and 42 weeks of pregnancy. When this does not happen or when there is a medical need for labor to begin, steps may be taken to induce labor. Labor induction causes a pregnant woman's uterus to contract. It also causes the cervix to soften (ripen), open (dilate), and thin out (efface). Usually, labor is not induced before 39 weeks of  pregnancy unless there is a medical reason to do so. Your health care provider will determine if labor induction is needed. Before inducing labor, your health care provider will consider a number of factors, including:  Your medical condition and your baby's.  How many weeks along you are in your pregnancy.  How mature your baby's lungs are.  The condition of your cervix.  The position of your baby.  The size of your birth canal. What are some reasons for labor induction? Labor may be induced if:  Your health or your baby's health is at risk.  Your pregnancy is overdue by 1 week or more.  Your water breaks but labor does not start on its own.  There is a low amount of amniotic fluid around your baby. You may also choose (elect) to have labor induced at a certain time. Generally, elective labor induction is done no earlier than 39 weeks of pregnancy. What methods are used for labor induction? Methods used for labor induction include:  Prostaglandin medicine. This medicine starts contractions and causes the cervix to dilate and ripen. It can be taken by mouth (orally) or by being  inserted into the vagina (suppository).  Inserting a small, thin tube (catheter) with a balloon into the vagina and then expanding the balloon with water to dilate the cervix.  Stripping the membranes. In this method, your health care provider gently separates amniotic sac tissue from the cervix. This causes the cervix to stretch, which in turn causes the release of a hormone called progesterone. The hormone causes the uterus to contract. This procedure is often done during an office visit, after which you will be sent home to wait for contractions to begin.  Breaking the water. In this method, your health care provider uses a small instrument to make a small hole in the amniotic sac. This eventually causes the amniotic sac to break. Contractions should begin after a few hours.  Medicine to trigger or  strengthen contractions. This medicine is given through an IV that is inserted into a vein in your arm. Except for membrane stripping, which can be done in a clinic, labor induction is done in the hospital so that you and your baby can be carefully monitored. How long does it take for labor to be induced? The length of time it takes to induce labor depends on how ready your body is for labor. Some inductions can take up to 2-3 days, while others may take less than a day. Induction may take longer if:  You are induced early in your pregnancy.  It is your first pregnancy.  Your cervix is not ready. What are some risks associated with labor induction? Some risks associated with labor induction include:  Changes in fetal heart rate, such as being too high, too low, or irregular (erratic).  Failed induction.  Infection in the mother or the baby.  Increased risk of having a cesarean delivery.  Fetal death.  Breaking off (abruption) of the placenta from the uterus (rare).  Rupture of the uterus (very rare). When induction is needed for medical reasons, the benefits of induction generally outweigh the risks. What are some reasons for not inducing labor? Labor induction should not be done if:  Your baby does not tolerate contractions.  You have had previous surgeries on your uterus, such as a myomectomy, removal of fibroids, or a vertical scar from a previous cesarean delivery.  Your placenta lies very low in your uterus and blocks the opening of the cervix (placenta previa).  Your baby is not in a head-down position.  The umbilical cord drops down into the birth canal in front of the baby.  There are unusual circumstances, such as the baby being very early (premature).  You have had more than 2 previous cesarean deliveries. Summary  Labor induction is when steps are taken to cause a pregnant woman to begin the labor process.  Labor induction causes a pregnant woman's uterus to  contract. It also causes the cervix to ripen, dilate, and efface.  Labor is not induced before 39 weeks of pregnancy unless there is a medical reason to do so.  When induction is needed for medical reasons, the benefits of induction generally outweigh the risks. This information is not intended to replace advice given to you by your health care provider. Make sure you discuss any questions you have with your health care provider. Document Released: 08/11/2006 Document Revised: 05/05/2016 Document Reviewed: 05/05/2016 Elsevier Interactive Patient Education  2019 [employer].

## 2018-06-22 ENCOUNTER — Other Ambulatory Visit: Payer: Self-pay | Admitting: Obstetrics and Gynecology

## 2018-06-22 ENCOUNTER — Encounter: Payer: Self-pay | Admitting: Obstetrics and Gynecology

## 2018-06-22 NOTE — Progress Notes (Signed)
Orders entered in sign & held  Raelyn Mora, [place]

## 2018-06-26 ENCOUNTER — Other Ambulatory Visit: Payer: Self-pay | Admitting: Family Medicine

## 2018-06-27 ENCOUNTER — Ambulatory Visit (HOSPITAL_COMMUNITY): Payer: Medicaid Other | Admitting: *Deleted

## 2018-06-27 ENCOUNTER — Inpatient Hospital Stay (HOSPITAL_COMMUNITY)
Admission: AD | Admit: 2018-06-27 | Discharge: 2018-06-29 | DRG: 806 | Disposition: A | Discharge status: Home / Self Care | Payer: Medicaid Other | Attending: Obstetrics and Gynecology | Admitting: Obstetrics and Gynecology

## 2018-06-27 ENCOUNTER — Ambulatory Visit (HOSPITAL_COMMUNITY)
Admission: RE | Admit: 2018-06-27 | Discharge: 2018-06-27 | Disposition: A | Discharge status: Home / Self Care | Payer: Medicaid Other | Source: Ambulatory Visit | Attending: Obstetrics and Gynecology | Admitting: Obstetrics and Gynecology

## 2018-06-27 ENCOUNTER — Encounter (HOSPITAL_COMMUNITY): Payer: Self-pay

## 2018-06-27 ENCOUNTER — Other Ambulatory Visit: Payer: Self-pay

## 2018-06-27 DIAGNOSIS — O1002 Pre-existing essential hypertension complicating childbirth: Secondary | ICD-10-CM | POA: Diagnosis not present

## 2018-06-27 DIAGNOSIS — O2602 Excessive weight gain in pregnancy, second trimester: Secondary | ICD-10-CM

## 2018-06-27 DIAGNOSIS — O10013 Pre-existing essential hypertension complicating pregnancy, third trimester: Secondary | ICD-10-CM | POA: Diagnosis not present

## 2018-06-27 DIAGNOSIS — O99343 Other mental disorders complicating pregnancy, third trimester: Secondary | ICD-10-CM

## 2018-06-27 DIAGNOSIS — O99213 Obesity complicating pregnancy, third trimester: Secondary | ICD-10-CM | POA: Diagnosis not present

## 2018-06-27 DIAGNOSIS — O368130 Decreased fetal movements, third trimester, not applicable or unspecified: Principal | ICD-10-CM | POA: Diagnosis present

## 2018-06-27 DIAGNOSIS — Z3A39 39 weeks gestation of pregnancy: Secondary | ICD-10-CM | POA: Diagnosis not present

## 2018-06-27 DIAGNOSIS — O10919 Unspecified pre-existing hypertension complicating pregnancy, unspecified trimester: Secondary | ICD-10-CM | POA: Diagnosis present

## 2018-06-27 DIAGNOSIS — F410 Panic disorder [episodic paroxysmal anxiety] without agoraphobia: Secondary | ICD-10-CM | POA: Diagnosis present

## 2018-06-27 DIAGNOSIS — F319 Bipolar disorder, unspecified: Secondary | ICD-10-CM

## 2018-06-27 DIAGNOSIS — O3663X0 Maternal care for excessive fetal growth, third trimester, not applicable or unspecified: Secondary | ICD-10-CM | POA: Diagnosis present

## 2018-06-27 DIAGNOSIS — O99214 Obesity complicating childbirth: Secondary | ICD-10-CM | POA: Diagnosis present

## 2018-06-27 DIAGNOSIS — Z3480 Encounter for supervision of other normal pregnancy, unspecified trimester: Secondary | ICD-10-CM

## 2018-06-27 DIAGNOSIS — O99344 Other mental disorders complicating childbirth: Secondary | ICD-10-CM | POA: Diagnosis present

## 2018-06-27 DIAGNOSIS — Z87891 Personal history of nicotine dependence: Secondary | ICD-10-CM

## 2018-06-27 DIAGNOSIS — E669 Obesity, unspecified: Secondary | ICD-10-CM | POA: Diagnosis present

## 2018-06-27 HISTORY — DX: Bipolar disorder, unspecified: F31.9

## 2018-06-27 HISTORY — DX: Panic disorder (episodic paroxysmal anxiety): F41.0

## 2018-06-27 HISTORY — DX: Other seasonal allergic rhinitis: J30.2

## 2018-06-27 LAB — CBC
HCT: 40.1 % (ref 36.0–46.0)
Hemoglobin: 13.9 g/dL (ref 12.0–15.0)
MCH: 31.2 pg (ref 26.0–34.0)
MCHC: 34.7 g/dL (ref 30.0–36.0)
MCV: 90.1 fL (ref 80.0–100.0)
Platelets: 216 10*3/uL (ref 150–400)
RBC: 4.45 MIL/uL (ref 3.87–5.11)
RDW: 13.2 % (ref 11.5–15.5)
WBC: 10.1 10*3/uL (ref 4.0–10.5)
nRBC: 0 % (ref 0.0–0.2)

## 2018-06-27 LAB — PROTEIN / CREATININE RATIO, URINE
Creatinine, Urine: 193.75 mg/dL
Protein Creatinine Ratio: 0.06 mg/mg{Cre} (ref 0.00–0.15)
Total Protein, Urine: 11 mg/dL

## 2018-06-27 LAB — ABO/RH: ABO/RH(D): A POS

## 2018-06-27 LAB — COMPREHENSIVE METABOLIC PANEL
ALT: 16 U/L (ref 0–44)
AST: 21 U/L (ref 15–41)
Albumin: 2.7 g/dL — ABNORMAL LOW (ref 3.5–5.0)
Alkaline Phosphatase: 98 U/L (ref 38–126)
Anion gap: 4 — ABNORMAL LOW (ref 5–15)
BILIRUBIN TOTAL: 0.1 mg/dL — AB (ref 0.3–1.2)
BUN: 8 mg/dL (ref 6–20)
CALCIUM: 8.7 mg/dL — AB (ref 8.9–10.3)
CO2: 19 mmol/L — ABNORMAL LOW (ref 22–32)
Chloride: 113 mmol/L — ABNORMAL HIGH (ref 98–111)
Creatinine, Ser: 0.76 mg/dL (ref 0.44–1.00)
GFR calc Af Amer: >60 mL/min (ref >60)
Glucose, Bld: 85 mg/dL (ref 70–99)
Potassium: 4 mmol/L (ref 3.5–5.1)
Sodium: 136 mmol/L (ref 135–145)
TOTAL PROTEIN: 5.4 g/dL — AB (ref 6.5–8.1)

## 2018-06-27 LAB — TYPE AND SCREEN
ABO/RH(D): A POS
Antibody Screen: NEGATIVE

## 2018-06-27 MED ORDER — OXYTOCIN BOLUS FROM INFUSION: 500.0000 mL | Freq: Once | INTRAVENOUS | Status: DC

## 2018-06-27 MED ORDER — OXYTOCIN 40 UNITS IN NORMAL SALINE INFUSION - SIMPLE MED
1.0000 m[IU]/min | INTRAVENOUS | Status: DC
  Administered 2018-06-27: 2 m[IU]/min via INTRAVENOUS

## 2018-06-27 MED ORDER — LACTATED RINGERS IV SOLN
INTRAVENOUS | Status: DC
  Administered 2018-06-27 (×3): via INTRAVENOUS

## 2018-06-27 MED ORDER — FENTANYL CITRATE (PF) 100 MCG/2ML IJ SOLN: 100.0000 ug | INTRAMUSCULAR | Status: DC | PRN

## 2018-06-27 MED ORDER — MISOPROSTOL 25 MCG QUARTER TABLET
25.0000 ug | ORAL_TABLET | ORAL | Status: DC | PRN
  Administered 2018-06-27: 25 ug via VAGINAL
  Filled 2018-06-27: qty 1

## 2018-06-27 MED ORDER — OXYTOCIN 40 UNITS IN NORMAL SALINE INFUSION - SIMPLE MED
1.0000 m[IU]/min | INTRAVENOUS | Status: DC
  Filled 2018-06-27: qty 1000

## 2018-06-27 MED ORDER — MISOPROSTOL 50MCG HALF TABLET: 50.0000 ug | ORAL_TABLET | ORAL | Status: DC | PRN

## 2018-06-27 MED ORDER — TERBUTALINE SULFATE 1 MG/ML IJ SOLN: 0.2500 mg | Freq: Once | INTRAMUSCULAR | Status: DC | PRN

## 2018-06-27 MED ORDER — ACETAMINOPHEN 325 MG PO TABS: 650.0000 mg | ORAL_TABLET | ORAL | Status: DC | PRN

## 2018-06-27 MED ORDER — CALCIUM CARBONATE ANTACID 500 MG PO CHEW
1.0000 | CHEWABLE_TABLET | ORAL | Status: DC | PRN
  Administered 2018-06-27 – 2018-06-28 (×2): 200 mg via ORAL
  Filled 2018-06-27 (×2): qty 1

## 2018-06-27 MED ORDER — LIDOCAINE HCL (PF) 1 % IJ SOLN: 30.0000 mL | INTRAMUSCULAR | Status: DC | PRN

## 2018-06-27 MED ORDER — OXYTOCIN 40 UNITS IN NORMAL SALINE INFUSION - SIMPLE MED
2.5000 [IU]/h | INTRAVENOUS | Status: DC
  Filled 2018-06-27: qty 1000

## 2018-06-27 MED ORDER — LACTATED RINGERS IV SOLN
500.0000 mL | INTRAVENOUS | Status: DC | PRN
  Administered 2018-06-28: 500 mL via INTRAVENOUS

## 2018-06-27 NOTE — Progress Notes (Addendum)
OB/GYN Faculty Practice: Labor Progress Note  Subjective: Feeling crampy but not feeling any distinct contractions at this time. No concerns. FOB at bedside for support.   Objective: BP 135/71   Pulse 75   Temp 98.8 F (37.1 C) (Oral)   Resp 18   Ht 5\' 9"  (1.753 m)   Wt (!) 151 kg   LMP 09/25/2017 (Exact Date)   BMI 49.18 kg/m  Gen: well appearing, no distress Dilation: 4 Effacement (%): 50 Cervical Position: Anterior Station: Ballotable Presentation: Vertex Exam by:: Jonelle Sidle, RN  Assessment and Plan: 28 y.o. G2P1001 [redacted]w[redacted]d here for IOL for decreased fetal movement in the setting of LGA and cHTN.  Labor: starting pitocin 2x2 -- pain control: desires natural delivery -- PPH Risk: low  Fetal Well-Being: EFW 3960g >90%tile on [place] at [redacted]w[redacted]d. Cephalic by RN check -- Category 1 - continuous fetal monitoring  -- GBS negative  Burman Nieves, MD Family Medicine Resident 9:07 PM  I spoke with and examined patient and agree with resident/PA-S/MS/SNM's note and plan of care.  Cheral Marker, CNM, [facility] 06/27/2018 10:07 PM

## 2018-06-27 NOTE — H&P (Addendum)
LABOR AND DELIVERY ADMISSION HISTORY AND PHYSICAL NOTE  Kylie Bailey is a 28 y.o. female G2P1001 with IUP at [redacted]w[redacted]d by LMP presenting for IOL for decreased fetal movements. Patient had 8/8 BPP today but with difficulty as baby had decreased movements throughout exam. Patient advised to come straight to L&D. Patient does report positive fetal movement, just less overall today. She denies leakage of fluid or vaginal bleeding.  Prenatal History/Complications: PNC at [facility]  Pregnancy complications:  - Chronic HTN on Verapamil 120mg  qHS prior to pregnancy - LGA (EFW 8+12 @ 37wks) - stress/panic attacks/bipolar disorder - obesity (BMI >49)  Past Medical History: Past Medical History:  Diagnosis Date  . Anxiety   . Bulimia   . Depression   . GERD (gastroesophageal reflux disease)   . Hypertension   . Migraines   . Pregnancy induced hypertension     Past Surgical History: Past Surgical History:  Procedure Laterality Date  . TONSILLECTOMY    . TYMPANOSTOMY TUBE PLACEMENT Bilateral     Obstetrical History: OB History    Gravida  2   Para  1   Term  1   Preterm      AB      Living  1     SAB      TAB      Ectopic      Multiple      Live Births  1           Social History: Social History   Socioeconomic History  . Marital status: Married    Spouse name: Travanti  . Number of children: 1  . Years of education: [employer]  . Highest education level: 12th grade  Occupational History  . Occupation: Stay Home Mom  Social Needs  . Financial resource strain: Not very hard  . Food insecurity:    Worry: Sometimes true    Inability: Sometimes true  . Transportation needs:    Medical: Yes    Non-medical: Yes  Tobacco Use  . Smoking status: Former Smoker    Packs/day: 0.25    Years: 3.00    Pack years: 0.75    Types: Cigarettes  . Smokeless tobacco: Never Used  . Tobacco comment: working on quitting, at 4/day now - 11/16/17. Trying to  quit, 1 cig/day  Substance and Sexual Activity  . Alcohol use: No    Comment: occ  . Drug use: No  . Sexual activity: Yes    Comment: last IC one week ago  Lifestyle  . Physical activity:    Days per week: 0 days    Minutes per session: 0 min  . Stress: Only a little  Relationships  . Social connections:    Talks on phone: More than three times a week    Gets together: More than three times a week    Attends religious service: Never    Active member of club or organization: No    Attends meetings of clubs or organizations: Never    Relationship status: Married  Other Topics Concern  . Not on file  Social History Narrative   Lack of transportation    Family History: Family History  Problem Relation Age of Onset  . Hypertension Mother   . Heart attack Mother   . Drug abuse Father   . High Cholesterol Brother   . Diabetes Paternal Grandmother   . High blood pressure Paternal Grandmother   . Heart disease Paternal Grandmother   . Cancer  Paternal Grandmother   . Heart disease Maternal Uncle     Allergies: Allergies  Allergen Reactions  . Augmentin [Amoxicillin-Pot Clavulanate] Rash    Medications Prior to Admission  Medication Sig Dispense Refill Last Dose  . acetaminophen (TYLENOL) 325 MG tablet Take 650 mg by mouth every 6 (six) hours as needed for moderate pain.   Taking  . albuterol (PROVENTIL HFA;VENTOLIN HFA) 108 (90 Base) MCG/ACT inhaler Inhale 1-2 puffs into the lungs every 6 (six) hours as needed for wheezing or shortness of breath. 1 Inhaler 0 Taking  . aspirin 81 MG chewable tablet Chew 1 tablet (81 mg total) by mouth daily. (Patient not taking: Reported on 06/14/2018) 30 tablet 6 Not Taking  . calcium carbonate (TUMS - DOSED IN MG ELEMENTAL CALCIUM) 500 MG chewable tablet Chew 2 tablets by mouth 2 (two) times daily as needed for indigestion or heartburn.   Taking  . DICLEGIS 10-10 MG TBEC Take 2 tablets by mouth daily. Take 2 tablets at bedtime. May add 1  tablet each morning starting on Day 3 if symptoms persist. May add additional tablet at lunchtime on Day 4 if symptoms persist. (Patient not taking: Reported on 06/20/2018) 60 tablet 0 Not Taking  . fluticasone (FLONASE) 50 MCG/ACT nasal spray Place 1 spray into both nostrils daily. 16 g 2 Taking  . loratadine (CLARITIN) 10 MG tablet Take 1 tablet (10 mg total) by mouth daily. 30 tablet 11 Taking  . ondansetron (ZOFRAN) 4 MG tablet Take 1 tablet (4 mg total) by mouth every 8 (eight) hours as needed for nausea or vomiting. (Patient not taking: Reported on 06/20/2018) 20 tablet 0 Not Taking  . Prenatal MV & Min w/FA-DHA (PRENATAL ADULT GUMMY/DHA/FA) 0.4-25 MG CHEW Chew 1 tablet by mouth daily. 90 tablet 3 Taking     Review of Systems  All systems reviewed and negative except as stated in HPI  Physical Exam Last menstrual period 09/25/2017. General appearance: alert, oriented, NAD Lungs: normal respiratory effort Heart: regular rate Abdomen: soft, non-tender; gravid, FH appropriate for GA Extremities: No calf swelling or tenderness Presentation: cephalic Fetal monitoring: baseline 140bpm, moderate variability, + acels, no decels Uterine activity: irregular, ctx ~q7-10 mins Dilation: 3 Effacement (%): 30 Station: Ballotable Exam by:: Dr Darin Engels  Prenatal labs: ABO, Rh: A/Positive/-- (08/14 1219) Antibody: Negative (08/14 1219) Rubella: 6.53 (08/14 1219) RPR: Non Reactive (01/09 0847)  HBsAg: Negative (08/14 1219)  HIV: Non Reactive (01/09 0847)  GC/Chlamydia: negative GBS:   negative 2-hr GTT: WNL 82/153/112 Genetic screening:  AFP neg, Sickle Cell neg Anatomy US: Normal  Prenatal Transfer Tool  Maternal Diabetes: No Genetic Screening: Declined Maternal Ultrasounds/Referrals: Normal Fetal Ultrasounds or other Referrals:  None Maternal Substance Abuse:  No Significant Maternal Medications:  Meds include: Other: Aspirin, Albuterol Significant Maternal Lab Results: Lab values  include: Group B Strep negative  No results found for this or any previous visit (from the past 24 hour(s)).  Patient Active Problem List   Diagnosis Date Noted  . Chronic hypertension affecting pregnancy 06/27/2018  . Excessive weight gain during pregnancy in second trimester 03/16/2018  . Chronic hypertension during pregnancy, antepartum 12/23/2017  . Supervision of other normal pregnancy, antepartum 10/31/2017  . Bipolar disorder (HCC) 10/07/2017  . Skin tag 10/07/2017  . Seasonal allergies 08/18/2017  . Essential hypertension 12/03/2016  . Stress 12/11/2015  . Panic attacks 12/11/2015  . Obesity 12/11/2015  . Elevated BP without diagnosis of hypertension 12/11/2015    Assessment: Kylie Bailey is a  28 y.o. G2P1001 at [redacted]w[redacted]d here for IOL for decreased fetal movement and cHTN. Also expecting LGA infant.  #Labor: Given thickness of cervix. Will start with cytotec at this time. Consider pitocin once thinner. #Pain: Epidural/IV pain meds upon request #FWB:  Cat I #ID:  GBS neg, GC/Cl neg  #cHTN: normotensive and asymptomatic. Pre-E labs pending. Continue to monitor. IV Mag if severe BP/symptoms. IV Labetalol/Hydral for BP >160/>110   #Postpartum Planning: -- [declined] FLU / [x]  TDAP / Rubella immune -- boy(out)/both -- Desires BTL (papers signed 04/13/18). Unsure what other contraceptive measure at this time.   Joana Reamer 06/27/2018, 4:27 PM   CNM attestation:  I have seen and examined this patient; I agree with above documentation in the resident's note.   Kylie Bailey is a 28 y.o. G2P1001 here for IOL due to decreased FM; also with cHTN and LGA infant  PE: BP (!) 136/94   Pulse 85   Temp (!) 97 F (36.1 C) (Axillary)   Resp 18   Ht 5\' 9"  (1.753 m)   Wt (!) 151 kg   LMP 09/25/2017 (Exact Date)   BMI 49.18 kg/m  Gen: calm comfortable, NAD Resp: normal effort, no distress Abd: gravid  ROS, labs, PMH reviewed  Plan: -Admit to Labor &  Delivery -Plan cx ripening with cytotec to start, possible followed by Pitocin/AROM -CBC/CMET neg for pre-e; P/C ratio still pending -Prepare for LGA delivery  Arabella Merles CNM 06/27/2018, 7:12 PM

## 2018-06-28 ENCOUNTER — Inpatient Hospital Stay (HOSPITAL_COMMUNITY): Payer: Self-pay | Admitting: Anesthesiology

## 2018-06-28 ENCOUNTER — Encounter (HOSPITAL_COMMUNITY): Payer: Self-pay | Admitting: *Deleted

## 2018-06-28 ENCOUNTER — Encounter (HOSPITAL_COMMUNITY): Payer: Self-pay | Admitting: Anesthesiology

## 2018-06-28 DIAGNOSIS — Z3A39 39 weeks gestation of pregnancy: Secondary | ICD-10-CM

## 2018-06-28 DIAGNOSIS — O1002 Pre-existing essential hypertension complicating childbirth: Secondary | ICD-10-CM

## 2018-06-28 LAB — CBC
HCT: 38.2 % (ref 36.0–46.0)
HEMOGLOBIN: 13 g/dL (ref 12.0–15.0)
MCH: 30.7 pg (ref 26.0–34.0)
MCHC: 34 g/dL (ref 30.0–36.0)
MCV: 90.3 fL (ref 80.0–100.0)
Platelets: 181 10*3/uL (ref 150–400)
RBC: 4.23 MIL/uL (ref 3.87–5.11)
RDW: 13.2 % (ref 11.5–15.5)
WBC: 14 10*3/uL — ABNORMAL HIGH (ref 4.0–10.5)
nRBC: 0 % (ref 0.0–0.2)

## 2018-06-28 LAB — RPR: RPR Ser Ql: NONREACTIVE

## 2018-06-28 MED ORDER — SIMETHICONE 80 MG PO CHEW: 80.0000 mg | CHEWABLE_TABLET | ORAL | Status: DC | PRN

## 2018-06-28 MED ORDER — EPHEDRINE 5 MG/ML INJ: 10.0000 mg | INTRAVENOUS | Status: DC | PRN

## 2018-06-28 MED ORDER — IBUPROFEN 600 MG PO TABS
600.0000 mg | ORAL_TABLET | Freq: Four times a day (QID) | ORAL | Status: DC
  Administered 2018-06-28 – 2018-06-29 (×4): 600 mg via ORAL
  Filled 2018-06-28 (×4): qty 1

## 2018-06-28 MED ORDER — WITCH HAZEL-GLYCERIN EX PADS: 1.0000 "application " | MEDICATED_PAD | CUTANEOUS | Status: DC | PRN

## 2018-06-28 MED ORDER — DOCUSATE SODIUM 100 MG PO CAPS
100.0000 mg | ORAL_CAPSULE | Freq: Two times a day (BID) | ORAL | Status: DC
  Administered 2018-06-28 – 2018-06-29 (×2): 100 mg via ORAL
  Filled 2018-06-28 (×2): qty 1

## 2018-06-28 MED ORDER — LORATADINE 10 MG PO TABS
10.0000 mg | ORAL_TABLET | Freq: Every day | ORAL | Status: DC
  Administered 2018-06-28 – 2018-06-29 (×2): 10 mg via ORAL
  Filled 2018-06-28 (×2): qty 1

## 2018-06-28 MED ORDER — PHENYLEPHRINE 40 MCG/ML (10ML) SYRINGE FOR IV PUSH (FOR BLOOD PRESSURE SUPPORT): 80.0000 ug | PREFILLED_SYRINGE | INTRAVENOUS | Status: DC | PRN

## 2018-06-28 MED ORDER — PHENYLEPHRINE 40 MCG/ML (10ML) SYRINGE FOR IV PUSH (FOR BLOOD PRESSURE SUPPORT)
80.0000 ug | PREFILLED_SYRINGE | INTRAVENOUS | Status: DC | PRN
  Filled 2018-06-28: qty 10

## 2018-06-28 MED ORDER — LACTATED RINGERS IV SOLN: 500.0000 mL | Freq: Once | INTRAVENOUS | Status: DC

## 2018-06-28 MED ORDER — FENTANYL-BUPIVACAINE-NACL 0.5-0.125-0.9 MG/250ML-% EP SOLN
12.0000 mL/h | EPIDURAL | Status: DC | PRN
  Filled 2018-06-28: qty 250

## 2018-06-28 MED ORDER — DIPHENHYDRAMINE HCL 50 MG/ML IJ SOLN: 12.5000 mg | INTRAMUSCULAR | Status: DC | PRN

## 2018-06-28 MED ORDER — FENTANYL-BUPIVACAINE-NACL 0.5-0.125-0.9 MG/250ML-% EP SOLN: 12.0000 mL/h | EPIDURAL | Status: DC | PRN

## 2018-06-28 MED ORDER — COCONUT OIL OIL: 1.0000 "application " | TOPICAL_OIL | Status: DC | PRN

## 2018-06-28 MED ORDER — ONDANSETRON HCL 4 MG PO TABS: 4.0000 mg | ORAL_TABLET | ORAL | Status: DC | PRN

## 2018-06-28 MED ORDER — SENNOSIDES-DOCUSATE SODIUM 8.6-50 MG PO TABS
2.0000 | ORAL_TABLET | ORAL | Status: DC
  Administered 2018-06-28: 2 via ORAL
  Filled 2018-06-28: qty 2

## 2018-06-28 MED ORDER — DIBUCAINE 1 % RE OINT: 1.0000 "application " | TOPICAL_OINTMENT | RECTAL | Status: DC | PRN

## 2018-06-28 MED ORDER — DIPHENHYDRAMINE HCL 25 MG PO CAPS: 25.0000 mg | ORAL_CAPSULE | Freq: Four times a day (QID) | ORAL | Status: DC | PRN

## 2018-06-28 MED ORDER — BENZOCAINE-MENTHOL 20-0.5 % EX AERO
1.0000 "application " | INHALATION_SPRAY | CUTANEOUS | Status: DC | PRN
  Administered 2018-06-28: 1 via TOPICAL
  Filled 2018-06-28: qty 56

## 2018-06-28 MED ORDER — ACETAMINOPHEN 325 MG PO TABS
650.0000 mg | ORAL_TABLET | ORAL | Status: DC | PRN
  Administered 2018-06-28: 650 mg via ORAL
  Filled 2018-06-28: qty 2

## 2018-06-28 MED ORDER — ONDANSETRON HCL 4 MG/2ML IJ SOLN: 4.0000 mg | INTRAMUSCULAR | Status: DC | PRN

## 2018-06-28 MED ORDER — TETANUS-DIPHTH-ACELL PERTUSSIS 5-2.5-18.5 LF-MCG/0.5 IM SUSP: 0.5000 mL | Freq: Once | INTRAMUSCULAR | Status: DC

## 2018-06-28 MED ORDER — PRENATAL MULTIVITAMIN CH
1.0000 | ORAL_TABLET | Freq: Every day | ORAL | Status: DC
  Administered 2018-06-29: 1 via ORAL
  Filled 2018-06-28: qty 1

## 2018-06-28 NOTE — Progress Notes (Signed)
OB/GYN Faculty Practice: Labor Progress Note  Subjective: Contractions more painful now. FOB at bedside for support.   Objective: BP 109/79   Pulse 86   Temp 98.1 F (36.7 C) (Oral)   Resp 16   Ht 5\' 9"  (1.753 m)   Wt (!) 151 kg   LMP 09/25/2017 (Exact Date)   BMI 49.18 kg/m  Gen: moderate distress during contractions Dilation: 5 Effacement (%): 50 Cervical Position: Anterior Station: -2 Presentation: Vertex Exam by:: Dr. Darin Engels (Resident)   Assessment and Plan: 28 y.o. G2P1001 [redacted]w[redacted]d here for IOL for decreased FM in the setting of cHTN and LGA  Labor: s/p AROM, continue pitocin -- pain control: desires natural  -- PPH Risk: low  Fetal Well-Being: [id number] >90%tile on [place] at [redacted]w[redacted]d. FSE placed with last check, cephalic -- Category 1 - continuous fetal monitoring  -- GBS negative   Kylie Nieves, MD Family Medicine Resident 5:18 AM

## 2018-06-28 NOTE — Progress Notes (Signed)
Patient ID: Kylie Bailey, female   DOB: Nov 29, 1990, 28 y.o.   MRN: [id number] Kylie Bailey is a 28 y.o. G2P1001 at [redacted]w[redacted]d admitted for induction of labor due to decreased fm, CHTN.  Subjective: Feeling a little crampy, nothing bad  Objective: BP 118/75   Pulse 68   Temp 98.1 F (36.7 C) (Oral)   Resp 16   Ht 5\' 9"  (1.753 m)   Wt (!) 151 kg   LMP 09/25/2017 (Exact Date)   BMI 49.18 kg/m  No intake/output data recorded.  FHT:  FHR: 140 bpm, variability: moderate,  accelerations:  Present,  decelerations:  Absent UC:   q 1.5-67mins  SVE:   Dilation: 4 Effacement (%): 50 Station: -2 Exam by:: Joellyn Haff, CNM'  AROM small amt clear fluid  Pitocin @ 20 mu/min  Labs: Lab Results  Component Value Date   WBC 10.1 06/27/2018   HGB 13.9 06/27/2018   HCT 40.1 06/27/2018   MCV 90.1 06/27/2018   PLT 216 06/27/2018    Assessment / Plan: IOL d/t decreased fm, CHTN, not much cervical change on pitocin, now AROM'd  Labor: Progressing normally Fetal Wellbeing:  Category I Pain Control:  Labor support without medications Pre-eclampsia: CHTN, bp's stable, labs normal I/D:  GBS neg Anticipated MOD:  NSVD  Cheral Marker CNM, WHNP-BC 06/28/2018, 3:51 AM

## 2018-06-28 NOTE — Progress Notes (Signed)
Patient ID: Kylie Bailey, female   DOB: [birth year], 28 y.o.   MRN: [id number] Kylie Bailey is a 28 y.o. G2P1001 at [redacted]w[redacted]d admitted for induction of labor due to decreased fm.  Subjective: Comfortable w/ epidural  Objective: BP 128/75   Pulse 85   Temp 98.1 F (36.7 C) (Oral)   Resp 18   Ht 5\' 9"  (1.753 m)   Wt (!) 151 kg   LMP 09/25/2017 (Exact Date)   BMI 49.18 kg/m  No intake/output data recorded.  FHT:  FHR: 135 bpm, variability: moderate,  accelerations:  Present,  decelerations:  Present earlies UC:   regular, every 2 minutes  SVE:   Dilation: 6 Effacement (%): 90 Station: -2, -1 Exam by:: kbooker, cnm  Pitocin @ 20 mu/min  Labs: Lab Results  Component Value Date   WBC 14.0 (H) 06/28/2018   HGB 13.0 06/28/2018   HCT 38.2 06/28/2018   MCV 90.3 06/28/2018   PLT 181 06/28/2018    Assessment / Plan: Induction of labor due to decreased fm,  progressing well on pitocin  Labor: Progressing normally Fetal Wellbeing:  Category I Pain Control:  Epidural Pre-eclampsia: chtn, bp's stable I/D:  n/a Anticipated MOD:  NSVD  Kylie Bailey CNM, WHNP-BC 06/28/2018, 7:50 AM

## 2018-06-28 NOTE — Anesthesia Postprocedure Evaluation (Signed)
Anesthesia Post Note  Patient: Kylie Bailey  Procedure(s) Performed: AN AD HOC LABOR EPIDURAL     Patient location during evaluation: Mother Baby Anesthesia Type: Epidural Level of consciousness: awake Pain management: satisfactory to patient Vital Signs Assessment: post-procedure vital signs reviewed and stable Respiratory status: spontaneous breathing Cardiovascular status: stable Anesthetic complications: no    Last Vitals:  Vitals:   06/28/18 1040 06/28/18 1215  BP: (!) 142/79 122/71  Pulse:    Resp: 18 18  Temp: 37.1 C 37.1 C  SpO2: 100% 100%    Last Pain:  Vitals:   06/28/18 1215  TempSrc: Oral  PainSc:    Pain Goal: Patients Stated Pain Goal: 7 (06/27/18 1638)                 Cephus Shelling

## 2018-06-28 NOTE — Progress Notes (Addendum)
OB/GYN Faculty Practice: Labor Progress Note  Subjective: Feels crampy in some positions but overall still quite comfortable.   Objective: BP (!) 119/53   Pulse 65   Temp 99.2 F (37.3 C) (Oral)   Resp 16   Ht 5\' 9"  (1.753 m)   Wt (!) 151 kg   LMP 09/25/2017 (Exact Date)   BMI 49.18 kg/m  Gen: no distress Dilation: 4 Effacement (%): 50 Cervical Position: Anterior Station: Ballotable Presentation: Vertex Exam by:: Dr. Darin Engels  Assessment and Plan: 28 y.o. G2P1001 [redacted]w[redacted]d here for IOL for decreased fetal movement in the setting of LGA and cHTN.  Labor: head still ballotable on cervical check and not a candidate for AROM at this time, continue to go up on pitocin 2x2 -- pain control: desires natural birth -- PPH Risk: low  Fetal Well-Being: EFW 3960g >90%tile on [place] at [redacted]w[redacted]d. Cephalic by last check -- Category 1 - continuous fetal monitoring  -- GBS negative  Burman Nieves, MD Family Medicine Resident 12:57 AM   I spoke with and examined patient and agree with resident/PA-S/MS/SNM's note and plan of care.  Cheral Marker, CNM, [facility] 06/28/2018 3:54 AM

## 2018-06-28 NOTE — Anesthesia Preprocedure Evaluation (Signed)
Anesthesia Evaluation  Patient identified by MRN, date of birth, ID band Patient awake    Reviewed: Allergy & Precautions, H&P , NPO status , Patient's Chart, lab work & pertinent test results  Airway Mallampati: II   Neck ROM: full    Dental   Pulmonary former smoker,    breath sounds clear to auscultation       Cardiovascular hypertension,  Rhythm:regular Rate:Normal     Neuro/Psych  Headaches, PSYCHIATRIC DISORDERS Anxiety Depression Bipolar Disorder    GI/Hepatic GERD  ,  Endo/Other  Morbid obesity  Renal/GU      Musculoskeletal   Abdominal   Peds  Hematology   Anesthesia Other Findings   Reproductive/Obstetrics (+) Pregnancy                             Anesthesia Physical Anesthesia Plan  ASA: II  Anesthesia Plan: Epidural   Post-op Pain Management:    Induction: Intravenous  PONV Risk Score and Plan: 2 and Treatment may vary due to age or medical condition  Airway Management Planned: Natural Airway  Additional Equipment:   Intra-op Plan:   Post-operative Plan:   Informed Consent: I have reviewed the patients History and Physical, chart, labs and discussed the procedure including the risks, benefits and alternatives for the proposed anesthesia with the patient or authorized representative who has indicated his/her understanding and acceptance.       Plan Discussed with: Anesthesiologist  Anesthesia Plan Comments:         Anesthesia Quick Evaluation

## 2018-06-28 NOTE — Discharge Summary (Signed)
Postpartum Discharge Summary   Patient Name: Kylie Bailey DOB: 1990-06-01 MRN: [id number]  Date of admission: 06/27/2018 Delivering Provider: Cheral Marker   Date of discharge: 06/29/2018  Admitting diagnosis: 39wks, induction Intrauterine pregnancy: [redacted]w[redacted]d     Secondary diagnosis:  Active Problems:   Chronic hypertension affecting pregnancy  Additional problems: decreased fetal movement Discharge diagnosis: Term Pregnancy Delivered                                                       Postpartum procedures:none Augmentation: AROM, Pitocin and Cytotec Complications: None  Hospital course:  Induction of Labor With Vaginal Delivery   28 y.o. yo G2P1001 at [redacted]w[redacted]d was admitted to the hospital 06/27/2018 for induction of labor.  Indication for induction: decreased fm.  Patient had an uncomplicated labor course as follows: Membrane Rupture Time/Date: 3:45 AM ,06/28/2018   Intrapartum Procedures: Episiotomy:                                           Lacerations:  Periurethral [8]  Patient had delivery of a Viable infant.  Information for the patient's newborn:  Sidra, Brue [[id number]]  Delivery Method: Vag-Spont   Details of delivery can be found in separate delivery note.  Patient had a routine postpartum course. Denied headaches, vision changes on day of discharge. Patient is discharged home 06/29/18.  Magnesium Sulfate recieved: No BMZ received: No  Physical exam  Vitals:   06/28/18 1633 06/28/18 2059 06/29/18 0115 06/29/18 0529  BP: 139/75 120/67 (!) 142/83 133/84  Pulse: 81 80 74 73  Resp: 14 18 18 18   Temp: 98.9 F (37.2 C) 98.8 F (37.1 C) 98.8 F (37.1 C) 98.6 F (37 C)  TempSrc: Oral Oral  Oral  SpO2: 100% 99% 100% 98%  Weight:      Height:       General: alert, well-appearing, NAD Lochia: appropriate Uterine Fundus: firm Incision: N/A DVT Evaluation: No significant calf/ankle edema  Labs: Lab Results  Component Value Date   WBC 14.0 (H)  06/28/2018   HGB 13.0 06/28/2018   HCT 38.2 06/28/2018   MCV 90.3 06/28/2018   PLT 181 06/28/2018   CMP Latest Ref Rng & Units 06/27/2018  Glucose 70 - 99 mg/dL 85  BUN 6 - 20 mg/dL 8  Creatinine 9.41 - 7.40 mg/dL 8.14  Sodium 481 - 856 mmol/L 136  Potassium 3.5 - 5.1 mmol/L 4.0  Chloride 98 - 111 mmol/L 113(H)  CO2 22 - 32 mmol/L 19(L)  Calcium 8.9 - 10.3 mg/dL 3.1(S)  Total Protein 6.5 - 8.1 g/dL 9.7(W)  Total Bilirubin 0.3 - 1.2 mg/dL 2.6(V)  Alkaline Phos 38 - 126 U/L 98  AST 15 - 41 U/L 21  ALT 0 - 44 U/L 16    Discharge instruction: per After Visit Summary and "Baby and Me Booklet".  After visit meds:  Allergies as of 06/29/2018      Reactions   Augmentin [amoxicillin-pot Clavulanate] Rash      Medication List    STOP taking these medications   Diclegis 10-10 MG Tbec Generic drug:  Doxylamine-Pyridoxine   ondansetron 4 MG tablet Commonly known as:  ZOFRAN     TAKE these medications  acetaminophen 325 MG tablet Commonly known as:  TYLENOL Take 650 mg by mouth every 6 (six) hours as needed for moderate pain.   albuterol 108 (90 Base) MCG/ACT inhaler Commonly known as:  PROVENTIL HFA;VENTOLIN HFA Inhale 1-2 puffs into the lungs every 6 (six) hours as needed for wheezing or shortness of breath.   aspirin 81 MG chewable tablet Chew 1 tablet (81 mg total) by mouth daily. For 6 weeks postpartum What changed:  additional instructions   calcium carbonate 500 MG chewable tablet Commonly known as:  TUMS - dosed in mg elemental calcium Chew 2 tablets by mouth 2 (two) times daily as needed for indigestion or heartburn.   fluticasone 50 MCG/ACT nasal spray Commonly known as:  FLONASE Place 1 spray into both nostrils daily.   ibuprofen 800 MG tablet Commonly known as:  ADVIL,MOTRIN Take 1 tablet (800 mg total) by mouth 3 (three) times daily.   loratadine 10 MG tablet Commonly known as:  CLARITIN Take 1 tablet (10 mg total) by mouth daily.   Prenatal Adult  Gummy/DHA/FA 0.4-25 MG Chew Chew 1 tablet by mouth daily.   verapamil 120 MG CR tablet Commonly known as:  CALAN-SR Take 1 tablet (120 mg total) by mouth daily.       Diet: routine diet Activity: Advance as tolerated. Pelvic rest for 6 weeks.  Outpatient follow up:4 weeks Follow up Appt: Future Appointments  Date Time Provider Department Center  07/05/2018 11:10 AM Raelyn Mora, CNM [facility] None  07/12/2018 10:30 AM [facility]  08/09/2018 10:30 AM Raelyn Mora, CNM [facility] None   Follow up Visit:  Cheral Marker, CNM  [facility] Bucco S        Please schedule this patient for PP visit in: 1 week mood/bp check, then 4-6wks for ppv  High risk pregnancy complicated by: HTN  Delivery mode: SVD  Anticipated Birth Control: other/unsure  PP Procedures needed: BP check  Schedule Integrated BH visit: yes  Provider: Any provider    Newborn Data: Live born female  Birth Weight:  4020g APGAR: 9, 9  Newborn Delivery   Birth date/time:  06/28/2018 08:24:00 Delivery type:  Vaginal, Spontaneous    Baby Feeding: Both Bottle and Breast Disposition:home with mother  06/29/2018 Tamera Stands, DO

## 2018-06-29 ENCOUNTER — Encounter: Payer: Medicaid Other | Admitting: Obstetrics and Gynecology

## 2018-06-29 MED ORDER — CITALOPRAM HYDROBROMIDE 20 MG PO TABS: 20.0000 mg | ORAL_TABLET | Freq: Every day | ORAL | 1 refills | Status: DC

## 2018-06-29 MED ORDER — ASPIRIN 81 MG PO CHEW: 81.0000 mg | CHEWABLE_TABLET | Freq: Every day | ORAL | Status: AC

## 2018-06-29 MED ORDER — IBUPROFEN 800 MG PO TABS: 800.0000 mg | ORAL_TABLET | Freq: Three times a day (TID) | ORAL | 0 refills | Status: DC

## 2018-06-29 MED ORDER — VERAPAMIL HCL ER 120 MG PO TBCR: 120.0000 mg | EXTENDED_RELEASE_TABLET | Freq: Every day | ORAL | 1 refills | Status: DC

## 2018-06-29 NOTE — Lactation Note (Signed)
This note was copied from a baby's chart. Lactation Consultation Note  Patient Name: Kylie Bailey KDTOI'Z Date: 06/29/2018 Reason for consult: Initial assessment;1st time breastfeeding;Term P2, 17 hour female infant. Per parents, infant had one void and one stool since delivery. Mom's feeding choice at admission is breast and formula feeding. Mom did not breastfeed her 26 year old daughter and she really wants to breastfeed with her son. Mom taught back hand expression and colostrum is present both breast. Mom is active on the [facility] program in [place]. Mom has medela DEBP at home. Mom latched infant on right breast using the football hold position, after a few attempts infant sustained latch and active started suckling with few swallows infant breastfeed for 18 minutes. Mom supplemented with formula afterwards 7 ml based on infant's age/ hours. Mom knows to breastfeed according hunger cues, 8 or more times within 24 hours. LC discussed I & O. Reviewed Baby & Me book's Breastfeeding Basics.  Mom knows to call Nurse or LC if she has any questions, concerns or need assistance with latching infant to breast.  Mom made aware of O/P services, breastfeeding support groups, community resources, and our phone # for post-discharge questions.  Maternal Data Formula Feeding for Exclusion: Yes Reason for exclusion: Mother's choice to formula and breast feed on admission Has patient been taught Hand Expression?: Yes(Mom taught back hand expression colostrum present in both breast.) Does the patient have breastfeeding experience prior to this delivery?: No  Feeding Feeding Type: Breast Fed Nipple Type: Regular  LATCH Score Latch: Repeated attempts needed to sustain latch, nipple held in mouth throughout feeding, stimulation needed to elicit sucking reflex.  Audible Swallowing: A few with stimulation  Type of Nipple: Everted at rest and after stimulation  Comfort (Breast/Nipple):  Soft / non-tender  Hold (Positioning): Assistance needed to correctly position infant at breast and maintain latch.  LATCH Score: 7  Interventions Interventions: Breast feeding basics reviewed;Assisted with latch;Skin to skin;Breast massage;Hand express;Support pillows;Adjust position;Breast compression;Reverse pressure;Expressed milk  Lactation Tools Discussed/Used WIC Program: Yes   Consult Status Consult Status: Follow-up Date: 06/29/18 Follow-up type: In-patient    [name] 06/29/2018, 1:25 AM

## 2018-06-29 NOTE — Clinical Social Work Maternal (Signed)
CLINICAL SOCIAL WORK MATERNAL/CHILD NOTE  Patient Details  Name: Kylie Bailey MRN: [id number] Date of Birth: 10-16-1990  Date:  06/29/2018  Clinical Social Worker Initiating Note:  Ollen Barges Date/Time: Initiated:  06/29/18/1204     Child's Name:  Roland Rack (same as FOB but not a junior)   Biological Parents:  Mother, Father(Trissa Dobos and Teddie Curd DOB: 1992-07-10)   Need for Interpreter:  None   Reason for Referral:  Behavioral Health Concerns(Bipolar diagnosis)   Address:  [place] [address]    Phone number:  [phone] (home)     Additional phone number:   Household Members/Support Persons (HM/SP):   Household Member/Support Person 1, Household Member/Support Person 2   HM/SP Name Relationship DOB or Age  HM/SP -1 Abygayle Deltoro Husband/FOB April 24[birth year] 1992  HM/SP -2 Wilford Sports Daughter 04/21/2013  HM/SP -3        HM/SP -4        HM/SP -5        HM/SP -6        HM/SP -7        HM/SP -8          Natural Supports (not living in the home):  Immediate Family, Parent, Extended Family   Professional Supports:     Employment: Unemployed   Type of Work:     Education:  Sophia arranged:    Museum/gallery curator Resources:  Medicaid   Other Resources:  [employer]   Cultural/Religious Considerations Which May Impact Care:    Strengths:  Ability to meet basic needs , Home prepared for child , Pediatrician chosen   Psychotropic Medications:         Pediatrician:    [place] area  Pediatrician List:   [place]  [place]      Pediatrician Fax Number:    Risk Factors/Current Problems:  Mental Health Concerns    Cognitive State:  Able to Concentrate , Alert , Linear Thinking , Insightful    Mood/Affect:  Calm , Comfortable , Interested , Happy , Relaxed     CSW Assessment: CSW received consult for hx of anxiety, depression and bipolar disorder.  CSW met with MOB to offer support and complete assessment.    MOB resting in bed and FOB holding infant on couch. CSW introduced self and role and explained assessment was normally completed with just MOB. MOB requested FOB stay and gave CSW verbal permission to discuss anything in front of FOB. MOB stated she is currently living with FOB and 26-year-old daughter. MOB stated she is not currently employed and that her highest level of education was some college. MOB reported that she currently receives [facility] and understands she needs to reach out to [facility] and Medicaid to get infant added to plan.   CSW inquired about MOB's mental health history. Per MOB, she's been diagnosed with bipolar disorder and depression in the past. MOB stated she was diagnosed with bipolar disorder about 3 years ago and reported her symptoms as just experiencing highs and lows. MOB stated she was on medications for bipolar but discontinued them when she found out she was pregnant. MOB reported experiencing some PPD with her 26-year-old but believes she had some depression prior to that. MOB reported symptoms of not feeling useful, not eating, crying and just being very sad. MOB  stated she experienced these symptoms for about a year or two and was placed on medications and started counseling. MOB reported experiencing some highs and lows during pregnancy but that they were manageable. MOB informed CSW that she was started on Celexa while she was here in the hospital and reported having positive experiences with Celexa in the past. MOB denied any current SI or HI. MOB reported having a good support system that consists of FOB, MGM, and FOB's aunt. CSW provided education regarding the baby blues period vs. perinatal mood disorders, discussed treatment and gave resources for mental health follow up if concerns arise.  CSW recommends self-evaluation during  the postpartum time period using the New Mom Checklist from Postpartum Progress and encouraged MOB to contact a medical professional if symptoms are noted at any time.    MOB reported having all essential items for infant once discharged. MOB reported infant will be sleeping in a basinet once home. CSW provided review of Sudden Infant Death Syndrome (SIDS) precautions and safe sleeping habits. MOB plans to take infant to [facility].   MOB denied any further questions or concerns for CSW at this time.      CSW Plan/Description:  No Further Intervention Required/No Barriers to Discharge, Sudden Infant Death Syndrome (SIDS) Education, Perinatal Mood and Anxiety Disorder (PMADs) Education    Mackenzie  Irwin, LCSWA 06/29/2018, 12:27 PM  

## 2018-06-30 ENCOUNTER — Telehealth: Payer: Self-pay | Admitting: General Practice

## 2018-06-30 NOTE — Telephone Encounter (Signed)
Informed pt of appt scheduled with Angelina Sheriff., [facility] on 07/05/2018 at 10:30am.  This will be a virtual visit.  Pt will need to have bp checked as well.  Pt stated if she doesn't have access to a BP cuff by Wednesday, 07/05/2018 she will call to be scheduled to come in for RN visit.  Registrar will call pt on 07/04/2018 to see if pt has access to cuff.  Pt verbalized understanding.

## 2018-06-30 NOTE — Telephone Encounter (Signed)
Left message for pt to give our office a call back in regards to BP check appt.

## 2018-07-02 ENCOUNTER — Inpatient Hospital Stay (HOSPITAL_COMMUNITY)
Admission: AD | Admit: 2018-07-02 | Payer: Medicaid Other | Source: Home / Self Care | Admitting: Obstetrics & Gynecology

## 2018-07-02 ENCOUNTER — Inpatient Hospital Stay (HOSPITAL_COMMUNITY): Payer: Medicaid Other

## 2018-07-04 ENCOUNTER — Ambulatory Visit: Payer: Medicaid Other

## 2018-07-05 ENCOUNTER — Ambulatory Visit: Payer: Medicaid Other

## 2018-07-05 ENCOUNTER — Ambulatory Visit (INDEPENDENT_AMBULATORY_CARE_PROVIDER_SITE_OTHER): Payer: Medicaid Other | Admitting: Clinical

## 2018-07-05 ENCOUNTER — Ambulatory Visit: Payer: Medicaid Other | Admitting: Obstetrics and Gynecology

## 2018-07-05 ENCOUNTER — Other Ambulatory Visit: Payer: Self-pay

## 2018-07-05 DIAGNOSIS — F319 Bipolar disorder, unspecified: Secondary | ICD-10-CM | POA: Diagnosis not present

## 2018-07-05 NOTE — BH Specialist Note (Signed)
Integrated Behavioral Health Visit via Telemedicine (Telephone)  07/05/2018 LYAN BIFFLE [id number]   Session Start time: 10:30  Session End time: 10:48 Total time: 20 minutes  Referring Provider: Raelyn Mora, CNM Type of Visit: Telephonic Patient location: Home [facility] Provider location: [facility] All persons participating in visit: [facility] and Patient  Confirmed patient's address: Yes  Confirmed patient's phone number: Yes  Any changes to demographics: No   Confirmed patient's insurance: Yes  Any changes to patient's insurance: No   Discussed confidentiality: Yes    The following statements were read to the patient and/or legal guardian that are established with the [facility] Provider.  "The purpose of this phone visit is to provide behavioral health care while limiting exposure to the coronavirus (COVID19).  There is a possibility of technology failure and discussed alternative modes of communication if that failure occurs."  "By engaging in this telephone visit, you consent to the provision of healthcare.  Additionally, you authorize for your insurance to be billed for the services provided during this telephone visit."   Patient and/or legal guardian consented to telephone visit: Yes   PRESENTING CONCERNS: Patient and/or family reports the following symptoms/concerns: Pt states she feels she is coping better than expected postpartum, is not feeling an escalation of either depression or anxiety, she and baby are sleeping up to 3 hours at a time, husband and 5yo daughter are supportive, she has had no migraines postpartum, no mood instability, is taking Celexa as prescribed, and agrees to call [facility] to resume regular therapy sessions that she began prior to pregnancy. Pt is concerned about blood pressure; requests to have it checked at [facility].  Duration of problem: Ongoing ; Severity of problem: mild  STRENGTHS (Protective Factors/Coping Skills): Supportive family,  positive outlook, actively engaged in therapy in the past, eating and sleeping well postpartum, and taking BH medication as prescribed  GOALS ADDRESSED: Patient will: 1.  Maintain reduction in symptoms of: mood instability  2.  Increase knowledge and/or ability of: healthy habits  3.  Demonstrate ability to: Increase healthy adjustment to current life circumstances and Increase adequate support systems for patient/family  INTERVENTIONS: Interventions utilized:  Psychoeducation and/or Health Education and Link to [employer] Standardized Assessments completed: did not give today  ASSESSMENT: Patient currently experiencing Bipolar Affective Disorder, as previously diagnosed by psychiatry.   Patient may benefit from psychoeducation and brief therapeutic interventions regarding maintaining reduction of mood instablilty .  PLAN: 1. Follow up with behavioral health clinician on : One week for brief phone check  2. Behavioral recommendations:  -Continue taking medication as prescribed, sleeping when baby sleeps, keeping a positive outlook, and allowing family to help with baby as needed -Follow [facility] instructions for BP check -Contact [facility] to set up appointment to resume services for ongoing therapy -Consider joining www.[email or web address].net online support group for new moms, for additional support 3. Referral(s): Integrated Art gallery manager (In Clinic), Community Resources:  New mom support and Counselor  Valetta Close [facility]  Depression screen [facility] 2/9 05/10/2018 12/22/2017 11/17/2017 11/16/2017 10/31/2017  Decreased Interest 0 0 0 0 1  Down, Depressed, Hopeless 0 1 0 0 1  PHQ - 2 Score 0 1 0 0 2  Altered sleeping 1 0 0 - -  Tired, decreased energy 1 1 1  - -  Change in appetite 0 1 0 - -  Feeling bad or failure about yourself  0 0 0 - -  Trouble concentrating 0 0 0 - -  Moving slowly or  fidgety/restless 0 0 0 - -  Suicidal thoughts 0 0 0 - -  PHQ-9 Score 2 3 1  - -   Difficult doing work/chores Not difficult at all Not difficult at all Not difficult at all - -   GAD 7 : Generalized Anxiety Score 05/10/2018 12/22/2017 10/05/2017 04/22/2017  Nervous, Anxious, on Edge 1 1 3 1   Control/stop worrying 1 1 3 1   Worry too much - different things 1 1 3 1   Trouble relaxing 0 1 3 1   Restless 0 1 2 1   Easily annoyed or irritable 1 1 3 1   Afraid - awful might happen 0 0 3 1  Total GAD 7 Score 4 6 20 7   Anxiety Difficulty Not difficult at all Somewhat difficult Very difficult -

## 2018-07-06 NOTE — Addendum Note (Signed)
Addended by: Hulda Marin C on: 07/06/2018 08:31 AM   Modules accepted: Level of Service

## 2018-07-10 ENCOUNTER — Other Ambulatory Visit: Payer: Self-pay

## 2018-07-10 ENCOUNTER — Ambulatory Visit: Payer: Medicaid Other

## 2018-07-10 NOTE — BH Specialist Note (Deleted)
Integrated Behavioral Health Visit via Telemedicine (Telephone)  07/10/2018 Kylie Bailey [id number]   Session Start time: ***  Session End time: *** Total time: {IBH Total VEHM:09470962}  Referring Provider: *** Type of Visit: Telephonic Patient location: *** [facility] Provider location: *** All persons participating in visit: ***  Confirmed patient's address: {YES/NO:[address]} Confirmed patient's phone number: {YES/NO:[address]197} Any changes to demographics: {YES/NO:21197}  Confirmed patient's insurance: {YES/NO:21197} Any changes to patient's insurance: {YES/NO:21197}  Discussed confidentiality: {YES/NO:21197}   The following statements were read to the patient and/or legal guardian that are established with the [facility] Provider.  "The purpose of this phone visit is to provide behavioral health care while limiting exposure to the coronavirus (COVID19).  There is a possibility of technology failure and discussed alternative modes of communication if that failure occurs."  "By engaging in this telephone visit, you consent to the provision of healthcare.  Additionally, you authorize for your insurance to be billed for the services provided during this telephone visit."   Patient and/or legal guardian consented to telephone visit: {YES/NO:21197}  PRESENTING CONCERNS: Patient and/or family reports the following symptoms/concerns: *** Duration of problem: ***; Severity of problem: {Mild/Moderate/Severe:20260}  STRENGTHS (Protective Factors/Coping Skills): ***  GOALS ADDRESSED: Patient will: 1.  Reduce symptoms of: {IBH Symptoms:21014056}  2.  Increase knowledge and/or ability of: {IBH Patient Tools:21014057}  3.  Demonstrate ability to: {IBH Goals:21014053}  INTERVENTIONS: Interventions utilized:  {IBH Interventions:21014054} Standardized Assessments completed: {IBH Screening Tools:21014051}  ASSESSMENT: Patient currently experiencing ***.   Patient may benefit from  ***.  PLAN: 1. Follow up with behavioral health clinician on : *** 2. Behavioral recommendations: *** 3. Referral(s): {IBH Referrals:21014055}  Asher Muir C McMannes

## 2018-07-12 ENCOUNTER — Ambulatory Visit: Payer: Self-pay

## 2018-07-12 ENCOUNTER — Institutional Professional Consult (permissible substitution): Payer: Medicaid Other

## 2018-07-12 MED ORDER — LIDOCAINE HCL (PF) 1 % IJ SOLN
INTRAMUSCULAR | Status: DC | PRN
  Administered 2018-06-28: 5 mL via EPIDURAL

## 2018-07-12 MED ORDER — SODIUM CHLORIDE (PF) 0.9 % IJ SOLN
INTRAMUSCULAR | Status: DC | PRN
  Administered 2018-06-28: 12 mL/h via EPIDURAL

## 2018-07-12 NOTE — Anesthesia Procedure Notes (Signed)
Epidural Patient location during procedure: OB Start time: 06/28/2018 7:17 AM End time: 06/28/2018 7:27 AM  Staffing Anesthesiologist: Achille Rich, MD Performed: anesthesiologist   Preanesthetic Checklist Completed: patient identified, site marked, pre-op evaluation, timeout performed, IV checked, risks and benefits discussed and monitors and equipment checked  Epidural Patient position: sitting Prep: DuraPrep Patient monitoring: heart rate, cardiac monitor, continuous pulse ox and blood pressure Approach: midline Location: L2-L3 Injection technique: LOR saline  Needle:  Needle type: Tuohy  Needle gauge: 17 G Needle length: 9 cm Needle insertion depth: 4 cm Catheter type: closed end flexible Catheter size: 19 Gauge Catheter at skin depth: 11 cm Test dose: negative and Other  Assessment Events: blood not aspirated, injection not painful, no injection resistance and negative IV test  Additional Notes Informed consent obtained prior to proceeding including risk of failure, 1% risk of PDPH, risk of minor discomfort and bruising.  Discussed rare but serious complications including epidural abscess, permanent nerve injury, epidural hematoma.  Discussed alternatives to epidural analgesia and patient desires to proceed.  Timeout performed pre-procedure verifying patient name, procedure, and platelet count.  Patient tolerated procedure well. Reason for block:procedure for pain

## 2018-07-14 ENCOUNTER — Inpatient Hospital Stay (HOSPITAL_COMMUNITY)
Admission: AD | Admit: 2018-07-14 | Discharge: 2018-07-14 | Disposition: A | Discharge status: Home / Self Care | Payer: Medicaid Other | Attending: Obstetrics and Gynecology | Admitting: Obstetrics and Gynecology

## 2018-07-14 ENCOUNTER — Encounter (HOSPITAL_COMMUNITY): Payer: Self-pay | Admitting: *Deleted

## 2018-07-14 ENCOUNTER — Other Ambulatory Visit: Payer: Self-pay

## 2018-07-14 DIAGNOSIS — Z87891 Personal history of nicotine dependence: Secondary | ICD-10-CM | POA: Insufficient documentation

## 2018-07-14 DIAGNOSIS — Z8249 Family history of ischemic heart disease and other diseases of the circulatory system: Secondary | ICD-10-CM | POA: Insufficient documentation

## 2018-07-14 DIAGNOSIS — O9989 Other specified diseases and conditions complicating pregnancy, childbirth and the puerperium: Secondary | ICD-10-CM | POA: Insufficient documentation

## 2018-07-14 DIAGNOSIS — Z833 Family history of diabetes mellitus: Secondary | ICD-10-CM | POA: Diagnosis not present

## 2018-07-14 DIAGNOSIS — O99345 Other mental disorders complicating the puerperium: Secondary | ICD-10-CM | POA: Diagnosis not present

## 2018-07-14 DIAGNOSIS — Z7982 Long term (current) use of aspirin: Secondary | ICD-10-CM | POA: Insufficient documentation

## 2018-07-14 DIAGNOSIS — F319 Bipolar disorder, unspecified: Secondary | ICD-10-CM | POA: Insufficient documentation

## 2018-07-14 DIAGNOSIS — F410 Panic disorder [episodic paroxysmal anxiety] without agoraphobia: Secondary | ICD-10-CM | POA: Insufficient documentation

## 2018-07-14 DIAGNOSIS — Z881 Allergy status to other antibiotic agents status: Secondary | ICD-10-CM | POA: Insufficient documentation

## 2018-07-14 DIAGNOSIS — Z79899 Other long term (current) drug therapy: Secondary | ICD-10-CM | POA: Diagnosis not present

## 2018-07-14 DIAGNOSIS — Z809 Family history of malignant neoplasm, unspecified: Secondary | ICD-10-CM | POA: Insufficient documentation

## 2018-07-14 DIAGNOSIS — O165 Unspecified maternal hypertension, complicating the puerperium: Secondary | ICD-10-CM | POA: Insufficient documentation

## 2018-07-14 DIAGNOSIS — R300 Dysuria: Secondary | ICD-10-CM | POA: Insufficient documentation

## 2018-07-14 LAB — URINALYSIS, ROUTINE W REFLEX MICROSCOPIC
Bilirubin Urine: NEGATIVE
Glucose, UA: NEGATIVE mg/dL
Ketones, ur: NEGATIVE mg/dL
Nitrite: NEGATIVE
Protein, ur: NEGATIVE mg/dL
RBC / HPF: >50 RBC/hpf — ABNORMAL HIGH (ref 0–5)
Specific Gravity, Urine: 1.018 (ref 1.005–1.030)
pH: 5 (ref 5.0–8.0)

## 2018-07-14 MED ORDER — NITROFURANTOIN MONOHYD MACRO 100 MG PO CAPS: 100.0000 mg | ORAL_CAPSULE | Freq: Two times a day (BID) | ORAL | 1 refills | Status: DC

## 2018-07-14 NOTE — MAU Note (Signed)
Called office, ? Prolapsed bladder or uterus.  Noted when she went to bathroom, something is protruding out vagina.  Vag del 3/25 (quick and big).  Hurts when wipes there.  Some burning when she pees.

## 2018-07-14 NOTE — MAU Provider Note (Addendum)
History     CSN: [id number]  Arrival date and time: 07/14/18 1614   First Provider Initiated Contact with Patient 07/14/18 1733      Chief Complaint  Patient presents with  . ? prolapsed uterus  or bladder   HPI Kylie Bailey is a 28 y.o. [id number] postpartum patient who presents to MAU with concern for uterine or bladder prolapse. She describes feeling "like an egg is coming out of my vagina" as well as burning with urination beginning this morning. She reports "normal" voids and regular bowel movements. Most recent bowel movement early today. She denies history of bladder prolapse.  She denies difficulty initiating voids, abnormal vaginal discharge, flank pain, fever or recent illness.   Patient is s/p precipitous SVD 06/28/2018. She is breast and bottle feeding.  OB History    Gravida  2   Para  2   Term  2   Preterm      AB      Living  2     SAB      TAB      Ectopic      Multiple  0   Live Births  2           Past Medical History:  Diagnosis Date  . Anxiety   . Bipolar disorder (HCC)   . Bulimia   . Depression   . GERD (gastroesophageal reflux disease)   . Hypertension   . Migraines   . Panic attacks   . Pregnancy induced hypertension   . Seasonal allergies     Past Surgical History:  Procedure Laterality Date  . TONSILLECTOMY    . TYMPANOSTOMY TUBE PLACEMENT Bilateral     Family History  Problem Relation Age of Onset  . Hypertension Mother   . Heart attack Mother   . Drug abuse Father   . High Cholesterol Brother   . Diabetes Paternal Grandmother   . High blood pressure Paternal Grandmother   . Heart disease Paternal Grandmother   . Cancer Paternal Grandmother   . Heart disease Maternal Uncle     Social History   Tobacco Use  . Smoking status: Former Smoker    Packs/day: 0.25    Years: 3.00    Pack years: 0.75    Types: Cigarettes  . Smokeless tobacco: Never Used  . Tobacco comment: working on quitting, at 4/day  now - 11/16/17. Trying to quit, 1 cig/day  Substance Use Topics  . Alcohol use: No    Comment: occ  . Drug use: No    Allergies:  Allergies  Allergen Reactions  . Augmentin [Amoxicillin-Pot Clavulanate] Rash    Medications Prior to Admission  Medication Sig Dispense Refill Last Dose  . acetaminophen (TYLENOL) 325 MG tablet Take 650 mg by mouth every 6 (six) hours as needed for moderate pain.   07/14/2018 at Unknown time  . albuterol (PROVENTIL HFA;VENTOLIN HFA) 108 (90 Base) MCG/ACT inhaler Inhale 1-2 puffs into the lungs every 6 (six) hours as needed for wheezing or shortness of breath. 1 Inhaler 0 Past Week at Unknown time  . calcium carbonate (TUMS - DOSED IN MG ELEMENTAL CALCIUM) 500 MG chewable tablet Chew 2 tablets by mouth 2 (two) times daily as needed for indigestion or heartburn.   07/14/2018 at Unknown time  . ibuprofen (ADVIL,MOTRIN) 800 MG tablet Take 1 tablet (800 mg total) by mouth 3 (three) times daily. 30 tablet 0 07/14/2018 at Unknown time  . loratadine (CLARITIN) 10 MG tablet  Take 1 tablet (10 mg total) by mouth daily. 30 tablet 11 07/14/2018 at Unknown time  . Prenatal MV & Min w/FA-DHA (PRENATAL ADULT GUMMY/DHA/FA) 0.4-25 MG CHEW Chew 1 tablet by mouth daily. 90 tablet 3 07/14/2018 at Unknown time  . aspirin 81 MG chewable tablet Chew 1 tablet (81 mg total) by mouth daily. For 6 weeks postpartum     . citalopram (CELEXA) 20 MG tablet Take 1 tablet (20 mg total) by mouth daily. 30 tablet 1   . fluticasone (FLONASE) 50 MCG/ACT nasal spray Place 1 spray into both nostrils daily. 16 g 2 Taking  . verapamil (CALAN-SR) 120 MG CR tablet Take 1 tablet (120 mg total) by mouth daily. 30 tablet 1     Review of Systems  Constitutional: Negative for fever.  Respiratory: Negative for shortness of breath.   Gastrointestinal: Negative for nausea and vomiting.  Genitourinary: Positive for dysuria. Negative for difficulty urinating, flank pain, vaginal bleeding, vaginal discharge and  vaginal pain.  All other systems reviewed and are negative.  Physical Exam   Blood pressure (!) 142/94, pulse 96, temperature 98.7 F (37.1 C), temperature source Oral, resp. rate 18, weight (!) 138 kg, SpO2 100 %, currently breastfeeding.  Physical Exam  Nursing note and vitals reviewed. Constitutional: She is oriented to person, place, and time. She appears well-developed and well-nourished.  Cardiovascular: Normal rate.  Respiratory: Effort normal.  GI: Soft. She exhibits no distension. There is no abdominal tenderness. There is no rebound, no guarding and no CVA tenderness.  Genitourinary:    No vaginal discharge.     Genitourinary Comments: Slight palpable bulge at posterior surface of vaginal void. No prolapse noted. No prolapse with prolonged valsalva.   Neurological: She is alert and oriented to person, place, and time.  Skin: Skin is warm and dry.  Psychiatric: She has a normal mood and affect. Her behavior is normal. Judgment and thought content normal.    MAU Course/MDM  Procedures: pertinent negatives  --Donia Ast, NP at bedside to confirm physical exam findings --Low concern for pyelo. Pertinent negative: fever, flank pain, abdominal pain, foul-smelling urine --Patient is breastfeeding, not eligible for Pyridium --Per consult with Dr. Vergie Living, initiate treatment for UTI. Rx Macrobid to pharmacy   Patient Vitals for the past 24 hrs:  BP Temp Temp src Pulse Resp SpO2 Weight  07/14/18 1816 121/86 - - 97 16 - -  07/14/18 1659 (!) 142/94 98.7 F (37.1 C) Oral 96 18 100 % (!) 138 kg    Results for orders placed or performed during the hospital encounter of 07/14/18 (from the past 24 hour(s))  Urinalysis, Routine w reflex microscopic     Status: Abnormal   Collection Time: 07/14/18  6:15 PM  Result Value Ref Range   Color, Urine YELLOW YELLOW   APPearance HAZY (A) CLEAR   Specific Gravity, Urine 1.018 1.005 - 1.030   pH 5.0 5.0 - 8.0   Glucose, UA NEGATIVE  NEGATIVE mg/dL   Hgb urine dipstick LARGE (A) NEGATIVE   Bilirubin Urine NEGATIVE NEGATIVE   Ketones, ur NEGATIVE NEGATIVE mg/dL   Protein, ur NEGATIVE NEGATIVE mg/dL   Nitrite NEGATIVE NEGATIVE   Leukocytes,Ua SMALL (A) NEGATIVE   RBC / HPF >50 (H) 0 - 5 RBC/hpf   WBC, UA 11-20 0 - 5 WBC/hpf   Bacteria, UA FEW (A) NONE SEEN   Squamous Epithelial / LPF 0-5 0 - 5   Mucus PRESENT    Non Squamous Epithelial 0-5 (A) NONE  SEEN    Meds ordered this encounter  Medications  . nitrofurantoin, macrocrystal-monohydrate, (MACROBID) 100 MG capsule    Sig: Take 1 capsule (100 mg total) by mouth 2 (two) times daily.    Dispense:  14 capsule    Refill:  1    Order Specific Question:   Supervising Provider    Answer:   Reva Bores [[id number]]     Assessment and Plan  --29 y.o. [id number] postpartum patient --No concerning findings on physical exam --Treat for UTI, urine culture pending --Discharge home in stable condition  F/U: in clinic as appropriate. Pt has televisit for normal postpartum on 08/09/2018  Calvert Cantor, CNM 07/14/2018, 7:02 PM

## 2018-07-14 NOTE — Discharge Instructions (Signed)
Urinary Tract Infection, Adult A urinary tract infection (UTI) is an infection of any part of the urinary tract. The urinary tract includes:  The kidneys.  The ureters.  The bladder.  The urethra. These organs make, store, and get rid of pee (urine) in the body. What are the causes? This is caused by germs (bacteria) in your genital area. These germs grow and cause swelling (inflammation) of your urinary tract. What increases the risk? You are more likely to develop this condition if:  You have a small, thin tube (catheter) to drain pee.  You cannot control when you pee or poop (incontinence).  You are female, and: ? You use these methods to prevent pregnancy: ? A medicine that kills sperm (spermicide). ? A device that blocks sperm (diaphragm). ? You have low levels of a female hormone (estrogen). ? You are pregnant.  You have genes that add to your risk.  You are sexually active.  You take antibiotic medicines.  You have trouble peeing because of: ? A prostate that is bigger than normal, if you are female. ? A blockage in the part of your body that drains pee from the bladder (urethra). ? A kidney stone. ? A nerve condition that affects your bladder (neurogenic bladder). ? Not getting enough to drink. ? Not peeing often enough.  You have other conditions, such as: ? Diabetes. ? A weak disease-fighting system (immune system). ? Sickle cell disease. ? Gout. ? Injury of the spine. What are the signs or symptoms? Symptoms of this condition include:  Needing to pee right away (urgently).  Peeing often.  Peeing small amounts often.  Pain or burning when peeing.  Blood in the pee.  Pee that smells bad or not like normal.  Trouble peeing.  Pee that is cloudy.  Fluid coming from the vagina, if you are female.  Pain in the belly or lower back. Other symptoms include:  Throwing up (vomiting).  No urge to eat.  Feeling mixed up (confused).  Being tired  and grouchy (irritable).  A fever.  Watery poop (diarrhea). How is this treated? This condition may be treated with:  Antibiotic medicine.  Other medicines.  Drinking enough water. Follow these instructions at home:  Medicines  Take over-the-counter and prescription medicines only as told by your doctor.  If you were prescribed an antibiotic medicine, take it as told by your doctor. Do not stop taking it even if you start to feel better. General instructions  Make sure you: ? Pee until your bladder is empty. ? Do not hold pee for a long time. ? Empty your bladder after sex. ? Wipe from front to back after pooping if you are a female. Use each tissue one time when you wipe.  Drink enough fluid to keep your pee pale yellow.  Keep all follow-up visits as told by your doctor. This is important. Contact a doctor if:  You do not get better after 1-2 days.  Your symptoms go away and then come back. Get help right away if:  You have very bad back pain.  You have very bad pain in your lower belly.  You have a fever.  You are sick to your stomach (nauseous).  You are throwing up. Summary  A urinary tract infection (UTI) is an infection of any part of the urinary tract.  This condition is caused by germs in your genital area.  There are many risk factors for a UTI. These include having a small, thin   tube to drain pee and not being able to control when you pee or poop.  Treatment includes antibiotic medicines for germs.  Drink enough fluid to keep your pee pale yellow. This information is not intended to replace advice given to you by your health care provider. Make sure you discuss any questions you have with your health care provider. Document Released: 09/08/2007 Document Revised: 09/29/2017 Document Reviewed: 09/29/2017 Elsevier Interactive Patient Education  2019 Elsevier Inc.  

## 2018-07-15 LAB — URINE CULTURE: Culture: NO GROWTH

## 2018-07-25 ENCOUNTER — Other Ambulatory Visit: Payer: Self-pay | Admitting: Certified Nurse Midwife

## 2018-07-25 DIAGNOSIS — J209 Acute bronchitis, unspecified: Secondary | ICD-10-CM

## 2018-08-09 ENCOUNTER — Ambulatory Visit: Payer: Medicaid Other | Admitting: Obstetrics and Gynecology

## 2018-08-10 ENCOUNTER — Ambulatory Visit (INDEPENDENT_AMBULATORY_CARE_PROVIDER_SITE_OTHER): Payer: Medicaid Other | Admitting: Obstetrics and Gynecology

## 2018-08-10 ENCOUNTER — Other Ambulatory Visit: Payer: Self-pay

## 2018-08-10 DIAGNOSIS — Z1389 Encounter for screening for other disorder: Secondary | ICD-10-CM

## 2018-08-10 DIAGNOSIS — I10 Essential (primary) hypertension: Secondary | ICD-10-CM

## 2018-08-10 NOTE — Progress Notes (Signed)
     WEBEX VIRTUAL POSTPARTUM VISIT ENCOUNTER NOTE  I connected with Kylie Bailey on 08/14/18 at 10:50 AM EDT by Webex at home and verified that I am speaking with the correct person using two identifiers.   I discussed the limitations, risks, security and privacy concerns of performing an evaluation and management service by video and the availability of in person appointments. I also discussed with the patient that there may be a patient responsible charge related to this service. The patient expressed understanding and agreed to proceed.   History:  Kylie Bailey is a 28 y.o. [id number] female who presents for a postpartum visit. She is 6 weeks postpartum following a vaginal delivery. I have fully reviewed the prenatal and intrapartum course. The delivery was at 39 gestational weeks.  Anesthesia: epidural. Postpartum course has been uncomplicated. Baby's course has been uncomplicated. Baby is feeding by formula: Kylie Bailey. Bleeding None. Bowel function is normal. Bladder function is normal. Patient is yes sexually active. First time was last week with no difficulties. Contraception method choice is IUD. Postpartum depression screening: negative: score 2.        Past Medical History:  Diagnosis Date  . Anxiety   . Bipolar disorder (HCC)   . Bulimia   . Depression   . GERD (gastroesophageal reflux disease)   . Hypertension   . Migraines   . Panic attacks   . Pregnancy induced hypertension   . Seasonal allergies    Past Surgical History:  Procedure Laterality Date  . TONSILLECTOMY    . TYMPANOSTOMY TUBE PLACEMENT Bilateral    The following portions of the patient's history were reviewed and updated as appropriate: allergies, current medications, past family history, past medical history, past social history, past surgical history and problem list.   Health Maintenance:  Normal pap on 11/16/2017.   Review of Systems:  Pertinent items noted in HPI and remainder of  comprehensive ROS otherwise negative. Reported BP that was taken 2 days prior to this visit: 124/80 Physical Exam:  Physical exam deferred due to nature of the encounter  Assessment and Plan:  Chronic hypertension - Rx'd Verapamil 120 mg hs, but not taking it - F/U with PCP  Encounter for postpartum visit - PPD online support group info given ([email or web address].net) - To be scheduled with [facility] - Taking Celexa 20 mg prn - Schedule IUD insertion ASAP   I discussed the assessment and treatment plan with the patient. The patient was provided an opportunity to ask questions and all were answered. The patient agreed with the plan and demonstrated an understanding of the instructions.   The patient was advised to call back or seek an in-person evaluation/go to the ED if the symptoms worsen or if the condition fails to improve as anticipated.  I provided 5 minutes of non-face-to-face time during this encounter. There was 10 minutes of chart review time spent prior to this encounter. Total time spent = 15 minutes.    Raelyn Mora, CNM [facility] [employer], [facility]

## 2018-08-14 ENCOUNTER — Encounter: Payer: Self-pay | Admitting: Obstetrics and Gynecology

## 2018-08-14 NOTE — Patient Instructions (Signed)
Intrauterine Device Insertion An intrauterine device (IUD) is a medical device that gets inserted into the uterus to prevent pregnancy. It is a small, T-shaped device that has one or two nylon strings hanging down from it. The strings hang out of the lower part of the uterus (cervix) to allow for future IUD removal. There are two types of IUDs available:  Copper IUD. This type of IUD has copper wire wrapped around it. Copper makes the uterus and fallopian tubes produce a fluid that kills sperm. A copper IUD may last up to 10 years.  Hormone IUD. This type of IUD is made of plastic and contains the hormone progestin (synthetic progesterone). The hormone thickens mucus in the cervix and prevents sperm from entering the uterus. It also thins the uterine lining to prevent implantation of a fertilized egg. The hormone can weaken or kill the sperm that get into the uterus. A hormone IUD may last 3-5 years. Tell a health care provider about:  Any allergies you have.  All medicines you are taking, including vitamins, herbs, eye drops, creams, and over-the-counter medicines.  Any problems you or family members have had with anesthetic medicines.  Any blood disorders you have.  Any surgeries you have had.  Any medical conditions you have, including any STIs (sexually transmitted infections) you may have.  Whether you are pregnant or may be pregnant. What are the risks? Generally, this is a safe procedure. However, problems may occur, including:  Infection.  Bleeding.  Allergic reactions to medicines.  Accidental puncture (perforation) of the uterus, or damage to other structures or organs.  Accidental placement of the IUD either in the muscle layer of the uterus (myometrium) or outside the uterus.  The IUD falling out of the uterus (expulsion). This is more common among women who have recently had a child.  Pregnancy that happens in the fallopian tube (ectopic pregnancy).  Infection of  the uterus and fallopian tubes (pelvic inflammatory disease). What happens before the procedure?  Schedule the IUD insertion for when you will have your menstrual period or right after, to make sure you are not pregnant. Placement of the IUD is better tolerated shortly after a menstrual cycle.  Follow instructions from your health care provider about eating or drinking restrictions.  Ask your health care provider about changing or stopping your regular medicines. This is especially important if you are taking diabetes medicines or blood thinners.  You may get a pain reliever to take before the procedure.  You may have tests for: ? Pregnancy. A pregnancy test involves having a urine sample taken. ? STIs. Placing an IUD in someone who has an STI can make the infection worse. ? Cervical cancer. You may have a Pap test to check for this type of cancer. This means collecting cells from your cervix to be examined under a microscope.  You may have a physical exam to determine the size and position of your uterus. The procedure may vary among health care providers and hospitals. What happens during the procedure?  A tool (speculum) will be placed in your vagina and widened so that your health care provider can see your cervix.  Medicine may be applied to your cervix to help lower your risk of infection (antiseptic medicine).  You may be given an anesthetic medicine to numb each side of your cervix (intracervical block or paracervical block). This medicine is usually given by an injection into the cervix.  A tool (uterine sound) will be inserted into your   an anesthetic medicine to numb each side of your cervix (intracervical block or paracervical block). This medicine is usually given by an injection into the cervix.  · A tool (uterine sound) will be inserted into your uterus to determine the length of your uterus and the direction that your uterus may be tilted.  · A slim instrument or tube (IUD inserter) that holds the IUD will be inserted into your vagina, through your cervical canal, and into your uterus.  · The IUD will be placed in the uterus, and the IUD inserter will be removed.  · The strings that are  attached to the IUD will be trimmed so that they lie just below the cervix.  The procedure may vary among health care providers and hospitals.  What happens after the procedure?  · You may have bleeding after the procedure. This is normal. It varies from light bleeding (spotting) for a few days to menstrual-like bleeding.  · You may have cramping and pain.  · You may feel dizzy or light-headed.  · You may have lower back pain.  Summary  · An intrauterine device (IUD) is a small, T-shaped device that has one or two nylon strings hanging down from it.  · Two types of IUDs are available. You may have a copper IUD or a hormone IUD.  · Schedule the IUD insertion for when you will have your menstrual period or right after, to make sure you are not pregnant. Placement of the IUD is better tolerated shortly after a menstrual cycle.  · You may have bleeding after the procedure. This is normal. It varies from light spotting for a few days to menstrual-like bleeding.  This information is not intended to replace advice given to you by your health care provider. Make sure you discuss any questions you have with your health care provider.  Document Released: 11/18/2010 Document Revised: 02/11/2016 Document Reviewed: 02/11/2016  Elsevier Interactive Patient Education © 2019 Elsevier Inc.    Levonorgestrel intrauterine device (IUD)  What is this medicine?  LEVONORGESTREL IUD (LEE voe nor jes trel) is a contraceptive (birth control) device. The device is placed inside the uterus by a healthcare professional. It is used to prevent pregnancy. This device can also be used to treat heavy bleeding that occurs during your period.  This medicine may be used for other purposes; ask your health care provider or pharmacist if you have questions.  COMMON BRAND NAME(S): Kyleena, LILETTA, Mirena, Skyla  What should I tell my health care provider before I take this medicine?  They need to know if you have any of these conditions:  -abnormal Pap  smear  -cancer of the breast, uterus, or cervix  -diabetes  -endometritis  -genital or pelvic infection now or in the past  -have more than one sexual partner or your partner has more than one partner  -heart disease  -history of an ectopic or tubal pregnancy  -immune system problems  -IUD in place  -liver disease or tumor  -problems with blood clots or take blood-thinners  -seizures  -use intravenous drugs  -uterus of unusual shape  -vaginal bleeding that has not been explained  -an unusual or allergic reaction to levonorgestrel, other hormones, silicone, or polyethylene, medicines, foods, dyes, or preservatives  -pregnant or trying to get pregnant  -breast-feeding  How should I use this medicine?  This device is placed inside the uterus by a health care professional.  Talk to your pediatrician regarding the use of this medicine   in children. Special care may be needed.  Overdosage: If you think you have taken too much of this medicine contact a poison control center or emergency room at once.  NOTE: This medicine is only for you. Do not share this medicine with others.  What if I miss a dose?  This does not apply. Depending on the brand of device you have inserted, the device will need to be replaced every 3 to 5 years if you wish to continue using this type of birth control.  What may interact with this medicine?  Do not take this medicine with any of the following medications:  -amprenavir  -bosentan  -fosamprenavir  This medicine may also interact with the following medications:  -aprepitant  -armodafinil  -barbiturate medicines for inducing sleep or treating seizures  -bexarotene  -boceprevir  -griseofulvin  -medicines to treat seizures like carbamazepine, ethotoin, felbamate, oxcarbazepine, phenytoin, topiramate  -modafinil  -pioglitazone  -rifabutin  -rifampin  -rifapentine  -some medicines to treat HIV infection like atazanavir, efavirenz, indinavir, lopinavir, nelfinavir, tipranavir, ritonavir  -St. John's  wort  -warfarin  This list may not describe all possible interactions. Give your health care provider a list of all the medicines, herbs, non-prescription drugs, or dietary supplements you use. Also tell them if you smoke, drink alcohol, or use illegal drugs. Some items may interact with your medicine.  What should I watch for while using this medicine?  Visit your doctor or health care professional for regular check ups. See your doctor if you or your partner has sexual contact with others, becomes HIV positive, or gets a sexual transmitted disease.  This product does not protect you against HIV infection (AIDS) or other sexually transmitted diseases.  You can check the placement of the IUD yourself by reaching up to the top of your vagina with clean fingers to feel the threads. Do not pull on the threads. It is a good habit to check placement after each menstrual period. Call your doctor right away if you feel more of the IUD than just the threads or if you cannot feel the threads at all.  The IUD may come out by itself. You may become pregnant if the device comes out. If you notice that the IUD has come out use a backup birth control method like condoms and call your health care provider.  Using tampons will not change the position of the IUD and are okay to use during your period.  This IUD can be safely scanned with magnetic resonance imaging (MRI) only under specific conditions. Before you have an MRI, tell your healthcare provider that you have an IUD in place, and which type of IUD you have in place.  What side effects may I notice from receiving this medicine?  Side effects that you should report to your doctor or health care professional as soon as possible:  -allergic reactions like skin rash, itching or hives, swelling of the face, lips, or tongue  -fever, flu-like symptoms  -genital sores  -high blood pressure  -no menstrual period for 6 weeks during use  -pain, swelling, warmth in the leg  -pelvic pain  or tenderness  -severe or sudden headache  -signs of pregnancy  -stomach cramping  -sudden shortness of breath  -trouble with balance, talking, or walking  -unusual vaginal bleeding, discharge  -yellowing of the eyes or skin  Side effects that usually do not require medical attention (report to your doctor or health care professional if they continue or are

## 2018-08-31 ENCOUNTER — Ambulatory Visit (INDEPENDENT_AMBULATORY_CARE_PROVIDER_SITE_OTHER): Payer: Medicaid Other | Admitting: Family Medicine

## 2018-08-31 ENCOUNTER — Encounter: Payer: Self-pay | Admitting: Family Medicine

## 2018-08-31 ENCOUNTER — Other Ambulatory Visit: Payer: Self-pay

## 2018-08-31 ENCOUNTER — Ambulatory Visit (HOSPITAL_COMMUNITY)
Admission: RE | Admit: 2018-08-31 | Discharge: 2018-08-31 | Disposition: A | Discharge status: Home / Self Care | Payer: Medicaid Other | Source: Ambulatory Visit | Attending: Family Medicine | Admitting: Family Medicine

## 2018-08-31 VITALS — BP 120/100 | Ht 69.0 in | Wt 309.5 lb

## 2018-08-31 DIAGNOSIS — R079 Chest pain, unspecified: Secondary | ICD-10-CM

## 2018-08-31 DIAGNOSIS — I10 Essential (primary) hypertension: Secondary | ICD-10-CM | POA: Diagnosis not present

## 2018-08-31 DIAGNOSIS — I498 Other specified cardiac arrhythmias: Secondary | ICD-10-CM | POA: Diagnosis not present

## 2018-08-31 DIAGNOSIS — R0789 Other chest pain: Secondary | ICD-10-CM | POA: Insufficient documentation

## 2018-08-31 MED ORDER — HYDROCHLOROTHIAZIDE 12.5 MG PO CAPS: 12.5000 mg | ORAL_CAPSULE | Freq: Every day | ORAL | 2 refills | Status: DC

## 2018-08-31 NOTE — Assessment & Plan Note (Addendum)
Patient presents with substernal chest pain with radiation to left arm.  EKG showed normal sinus rhythm no signs of ST elevation concerning for myocardial infarction.  No lower extremity swelling or significant shortness of breath that would be concerning for possible pulmonary embolus.  Oxygen saturation within normal limits.  Initial blood pressure was 140/100 however on repeat it was 120/100.  Patient is not taking verapamil which was initially prescribed for migraine.  Our differential diagnosis would include chest pain secondary to uncontrolled hypertension versus possible hypothyroidism versus musculoskeletal pain (currently nursing 14 lb baby) however less likely given lack of pain on palpation.  Chest pain symptoms are atypical, however given substernal location with pressure-like pain and radiation to left arm there is a concern for possible CAD versus postpartum cardiomyopathy versus poorly controlled blood pressure.  Given symptoms and comorbidities we will further investigate chest pain to rule out CAD/MI. --Order stat troponin --Patient scheduled for complete echo on 5/29 we will follow-up on results --Follow-up on TSH, CBC, CMP and d-dimer. --We will discontinue verapamil and start patient on HCTZ 12.5 mg daily.  Patient will need repeat BMP in 2 to 3 weeks.  She will continue to monitor her symptoms and will go to the ED for further evaluation if symptoms worsen.

## 2018-08-31 NOTE — Patient Instructions (Addendum)
It was great seeing you today! We have addressed the following issues today  1. I am starting you on HCTZ for your high blood pressure, since you are not breastfeeding and only doing formula it should be fine. You will need repeat lab in two weeks. 2. I ordered blood work for you and will follow up on the results. 3. You EKG is reassuring but given your symptoms we will do an echocardiogram. 4. If chest pain do not subside please go to the ED or return to clinic  If we did any lab work today, and the results require attention, either me or my nurse will get in touch with you. If everything is normal, you will get a letter in mail and a message via . If you don't hear from [place] in two weeks, please give [place] a call. Otherwise, we look forward to seeing you again at your next visit. If you have any questions or concerns before then, please call the clinic at [phone].  Please bring all your medications to every doctors visit  Sign up for My Chart to have easy access to your labs results, and communication with your Primary care physician. Please ask Front Desk for some assistance.   Please check-out at the front desk before leaving the clinic.    Take Care,   Dr. Sydnee Cabal

## 2018-08-31 NOTE — Progress Notes (Signed)
Subjective:    Patient ID: Kylie Bailey, female    DOB: 10/27/1990, 28 y.o.   MRN: [id number]   CC: Chest pain  HPI: Patient is a 28 year old female who presents today complaining of chest pain.  Patient reports that pain is located in the middle of her chest and feels like pressure.  Patient states that pain radiates to her left arm.  Symptoms initially started 4 weeks ago but have gradually worsened in the past week.  Patient has denied any significant shortness of breath or lower extremity swelling.  Patient has stopped taking her verapamil for migraine/high blood pressure.  Pain does not worsen with exertion.  Smoking status reviewed   ROS: all other systems were reviewed and are negative other than in the HPI   Past Medical History:  Diagnosis Date  . Anxiety   . Bipolar disorder (HCC)   . Bulimia   . Depression   . GERD (gastroesophageal reflux disease)   . Hypertension   . Migraines   . Panic attacks   . Pregnancy induced hypertension   . Seasonal allergies     Past Surgical History:  Procedure Laterality Date  . TONSILLECTOMY    . TYMPANOSTOMY TUBE PLACEMENT Bilateral     Past medical history, surgical, family, and social history reviewed and updated in the EMR as appropriate.  Objective:  BP (!) 120/100   Ht 5\' 9"  (1.753 m)   Wt (!) 309 lb 8 oz (140.4 kg)   LMP 08/31/2018   BMI 45.71 kg/m   Vitals and nursing note reviewed  General: NAD, pleasant, able to participate in exam Cardiac: RRR, normal heart sounds, no murmurs. 2+ radial and PT pulses bilaterally, no tenderness associated with palpation. Respiratory: CTAB, normal effort, No wheezes, rales or rhonchi Abdomen: soft, nontender, nondistended, no hepatic or splenomegaly, +BS Extremities: no edema or cyanosis. WWP.  No cord palpated. Skin: warm and dry, no rashes noted Neuro: alert and oriented x4, no focal deficits Psych: Normal affect and mood   Assessment & Plan:   Other chest pain  Patient presents with substernal chest pain with radiation to left arm.  EKG showed normal sinus rhythm no signs of ST elevation concerning for myocardial infarction.  No lower extremity swelling or significant shortness of breath that would be concerning for possible pulmonary embolus.  Oxygen saturation within normal limits.  Initial blood pressure was 140/100 however on repeat it was 120/100.  Patient is not taking verapamil which was initially prescribed for migraine.  Our differential diagnosis would include chest pain secondary to uncontrolled hypertension versus possible hypothyroidism versus musculoskeletal pain (currently nursing 14 lb baby) however less likely given lack of pain on palpation.  Chest pain symptoms are atypical, however given substernal location with pressure-like pain and radiation to left arm there is a concern for possible CAD versus postpartum cardiomyopathy versus poorly controlled blood pressure.  Given symptoms and comorbidities we will further investigate chest pain to rule out CAD/MI. --Order stat troponin --Patient scheduled for complete echo on 5/29 we will follow-up on results --Follow-up on TSH, CBC, CMP and d-dimer. --We will discontinue verapamil and start patient on HCTZ 12.5 mg daily.  Patient will need repeat BMP in 2 to 3 weeks.  She will continue to monitor her symptoms and will go to the ED for further evaluation if symptoms worsen.  Essential hypertension Patient has had elevated blood pressure intermittently over the past few months and during her pregnancy.  Patient was on verapamil  for migraine but she has discontinued it since giving birth.  Blood pressure in the past few weeks has been elevated on multiple occasion.  Symptoms of chest pain could be secondary to uncontrolled hypertension.  Will start patient on HCTZ 12.5 mg and discontinue verapamil for optimal BP control.  Patient will follow-up in the next few weeks for repeat BMP.   Lovena Neighbours,  MD [facility] PGY-3

## 2018-08-31 NOTE — Assessment & Plan Note (Signed)
Patient has had elevated blood pressure intermittently over the past few months and during her pregnancy.  Patient was on verapamil for migraine but she has discontinued it since giving birth.  Blood pressure in the past few weeks has been elevated on multiple occasion.  Symptoms of chest pain could be secondary to uncontrolled hypertension.  Will start patient on HCTZ 12.5 mg and discontinue verapamil for optimal BP control.  Patient will follow-up in the next few weeks for repeat BMP.

## 2018-09-01 ENCOUNTER — Ambulatory Visit (HOSPITAL_COMMUNITY)
Admission: RE | Admit: 2018-09-01 | Discharge: 2018-09-01 | Disposition: A | Discharge status: Home / Self Care | Payer: Medicaid Other | Source: Ambulatory Visit | Attending: Family Medicine | Admitting: Family Medicine

## 2018-09-01 DIAGNOSIS — I10 Essential (primary) hypertension: Secondary | ICD-10-CM | POA: Diagnosis not present

## 2018-09-01 DIAGNOSIS — R079 Chest pain, unspecified: Secondary | ICD-10-CM | POA: Diagnosis not present

## 2018-09-01 LAB — CMP14+EGFR
ALT: 63 IU/L — ABNORMAL HIGH (ref 0–32)
AST: 37 IU/L (ref 0–40)
Albumin/Globulin Ratio: 2.7 — ABNORMAL HIGH (ref 1.2–2.2)
Albumin: 4.3 g/dL (ref 3.9–5.0)
Alkaline Phosphatase: 76 IU/L (ref 39–117)
BUN/Creatinine Ratio: 11 (ref 9–23)
BUN: 11 mg/dL (ref 6–20)
Bilirubin Total: 0.3 mg/dL (ref 0.0–1.2)
CO2: 22 mmol/L (ref 20–29)
Calcium: 8.9 mg/dL (ref 8.7–10.2)
Chloride: 107 mmol/L — ABNORMAL HIGH (ref 96–106)
Creatinine, Ser: 0.97 mg/dL (ref 0.57–1.00)
GFR calc Af Amer: 93 mL/min/{1.73_m2} (ref >59)
GFR calc non Af Amer: 81 mL/min/{1.73_m2} (ref >59)
Globulin, Total: 1.6 g/dL (ref 1.5–4.5)
Glucose: 89 mg/dL (ref 65–99)
Potassium: 4.5 mmol/L (ref 3.5–5.2)
Sodium: 143 mmol/L (ref 134–144)
Total Protein: 5.9 g/dL — ABNORMAL LOW (ref 6.0–8.5)

## 2018-09-01 LAB — CBC WITH DIFFERENTIAL/PLATELET
Basophils Absolute: 0 10*3/uL (ref 0.0–0.2)
Basos: 1 %
EOS (ABSOLUTE): 0.1 10*3/uL (ref 0.0–0.4)
Eos: 2 %
Hematocrit: 41.3 % (ref 34.0–46.6)
Hemoglobin: 14.7 g/dL (ref 11.1–15.9)
Immature Grans (Abs): 0 10*3/uL (ref 0.0–0.1)
Immature Granulocytes: 0 %
Lymphocytes Absolute: 1.8 10*3/uL (ref 0.7–3.1)
Lymphs: 30 %
MCH: 32.5 pg (ref 26.6–33.0)
MCHC: 35.6 g/dL (ref 31.5–35.7)
MCV: 91 fL (ref 79–97)
Monocytes Absolute: 0.5 10*3/uL (ref 0.1–0.9)
Monocytes: 8 %
Neutrophils Absolute: 3.6 10*3/uL (ref 1.4–7.0)
Neutrophils: 59 %
Platelets: 262 10*3/uL (ref 150–450)
RBC: 4.53 x10E6/uL (ref 3.77–5.28)
RDW: 12.9 % (ref 11.7–15.4)
WBC: 6 10*3/uL (ref 3.4–10.8)

## 2018-09-01 LAB — LIPID PANEL
Chol/HDL Ratio: 4.5 ratio — ABNORMAL HIGH (ref 0.0–4.4)
Cholesterol, Total: 186 mg/dL (ref 100–199)
HDL: 41 mg/dL (ref >39)
LDL Calculated: 133 mg/dL — ABNORMAL HIGH (ref 0–99)
Triglycerides: 61 mg/dL (ref 0–149)
VLDL Cholesterol Cal: 12 mg/dL (ref 5–40)

## 2018-09-01 LAB — TROPONIN I: Troponin I: <0.01 ng/mL (ref 0.00–0.04)

## 2018-09-01 LAB — D-DIMER, QUANTITATIVE: D-DIMER: 0.25 mg/L FEU (ref 0.00–0.49)

## 2018-09-01 LAB — TSH: TSH: 1.57 u[IU]/mL (ref 0.450–4.500)

## 2018-09-01 NOTE — Progress Notes (Signed)
  Echocardiogram 2D Echocardiogram has been performed.  Gerda Diss 09/01/2018, 12:05 PM

## 2018-09-02 ENCOUNTER — Other Ambulatory Visit: Payer: Self-pay

## 2018-09-02 ENCOUNTER — Telehealth: Payer: Self-pay | Admitting: Family Medicine

## 2018-09-02 ENCOUNTER — Ambulatory Visit (HOSPITAL_COMMUNITY)
Admission: EM | Admit: 2018-09-02 | Discharge: 2018-09-02 | Disposition: A | Discharge status: Home / Self Care | Payer: Medicaid Other

## 2018-09-02 ENCOUNTER — Emergency Department (HOSPITAL_COMMUNITY)
Admission: EM | Admit: 2018-09-02 | Discharge: 2018-09-02 | Disposition: A | Discharge status: Home / Self Care | Payer: Medicaid Other | Attending: Emergency Medicine | Admitting: Emergency Medicine

## 2018-09-02 DIAGNOSIS — R0789 Other chest pain: Secondary | ICD-10-CM

## 2018-09-02 DIAGNOSIS — I10 Essential (primary) hypertension: Secondary | ICD-10-CM

## 2018-09-02 DIAGNOSIS — Z79899 Other long term (current) drug therapy: Secondary | ICD-10-CM | POA: Insufficient documentation

## 2018-09-02 DIAGNOSIS — R0600 Dyspnea, unspecified: Secondary | ICD-10-CM | POA: Diagnosis not present

## 2018-09-02 DIAGNOSIS — Z87891 Personal history of nicotine dependence: Secondary | ICD-10-CM | POA: Insufficient documentation

## 2018-09-02 LAB — URINALYSIS, ROUTINE W REFLEX MICROSCOPIC
Bacteria, UA: NONE SEEN
Bilirubin Urine: NEGATIVE
Glucose, UA: NEGATIVE mg/dL
Ketones, ur: 5 mg/dL — AB
Leukocytes,Ua: NEGATIVE
Nitrite: NEGATIVE
Protein, ur: NEGATIVE mg/dL
Specific Gravity, Urine: 1.02 (ref 1.005–1.030)
pH: 5 (ref 5.0–8.0)

## 2018-09-02 LAB — I-STAT BETA HCG BLOOD, ED (MC, WL, AP ONLY): I-stat hCG, quantitative: <5 m[IU]/mL (ref <5)

## 2018-09-02 LAB — I-STAT CHEM 8, ED
BUN: 15 mg/dL (ref 6–20)
Calcium, Ion: 1.12 mmol/L — ABNORMAL LOW (ref 1.15–1.40)
Chloride: 106 mmol/L (ref 98–111)
Creatinine, Ser: 1 mg/dL (ref 0.44–1.00)
Glucose, Bld: 92 mg/dL (ref 70–99)
HCT: 40 % (ref 36.0–46.0)
Hemoglobin: 13.6 g/dL (ref 12.0–15.0)
Potassium: 3.8 mmol/L (ref 3.5–5.1)
Sodium: 139 mmol/L (ref 135–145)
TCO2: 24 mmol/L (ref 22–32)

## 2018-09-02 LAB — RAPID URINE DRUG SCREEN, HOSP PERFORMED
Amphetamines: NOT DETECTED
Barbiturates: NOT DETECTED
Benzodiazepines: NOT DETECTED
Cocaine: NOT DETECTED
Opiates: NOT DETECTED
Tetrahydrocannabinol: NOT DETECTED

## 2018-09-02 LAB — D-DIMER, QUANTITATIVE: D-Dimer, Quant: <0.27 ug/mL-FEU (ref 0.00–0.50)

## 2018-09-02 MED ORDER — FAMOTIDINE 20 MG PO TABS: 20.0000 mg | ORAL_TABLET | Freq: Two times a day (BID) | ORAL | 0 refills | Status: DC

## 2018-09-02 NOTE — Telephone Encounter (Signed)
**  After Hours/ Emergency Line Call**  Received a call to report that Kylie Bailey was prescribed HCTZ on 08/31/2018. Patient has taken it twice. Endorsing HA, "feeling chills", N.  Denying V, SOB, rash, change in vision.  Recommended that patient stop taking medication for now. Red flags discussed. Patient to go to ED if developing red flag symptoms. Patient to make an appointment ASAP for re-evaluation of continuing HCTZ as cannot rule out allergy on the phone. Will forward to PCP.      Garnette Gunner, MD PGY-2, [facility] 09/02/2018 2:23 AM

## 2018-09-02 NOTE — ED Notes (Signed)
Patient verbalizes understanding of discharge instructions. Opportunity for questioning and answering were provided, patient discharged from ED. 

## 2018-09-02 NOTE — ED Triage Notes (Signed)
Pt has been seen by her primary MD and has been placed on BP meds, had ECHO done here as well.  Has had CP since Thursday intermittently. Woke this AM with chills and Pain starte last PM radiating from Chest to L arm.  No SOB noted.

## 2018-09-02 NOTE — Discharge Instructions (Signed)
1.  Your echocardiogram done yesterday was normal.  Your labs today do not suggest a blood clot to the lungs.  At this time, your chest pain appears less likely to be due to a heart or lung problem.  Continue to see your doctor for further evaluation and management.  Turn to the emergency department if things change or worsen significantly. 2.  Sometimes chest pain can be caused by gastric reflux disease.  Try taking Pepcid twice daily for the next 2 weeks.  See discharge instructions regarding reflux disease. 3.  Chest pain can be caused by anxiety.  It is important that all other causes be eliminated first.  Talk to your doctor if you think you would benefit from counseling or treatment.

## 2018-09-02 NOTE — ED Notes (Signed)
Pt needed to be seen in ED for further evaluation

## 2018-09-02 NOTE — ED Provider Notes (Signed)
[facility] EMERGENCY DEPARTMENT Provider Note   CSN: [id number] Arrival date & time: 09/02/18  1015    History   Chief Complaint No chief complaint on file.   HPI Kylie Bailey is a 28 y.o. female.     HPI Patient reports that for about the past week and a half she has been getting chest pains have a pressure and tight quality.  Sometimes there is a burning quality.  They have been waxing and waning.  No syncope.  Patient has been treated for diastolic hypertension since her pregnancy.  Patient is not breast-feeding.  Delivery was [phone].  She has not noted pain or swelling in her legs.  Patient does smoke a small amount.  She is working on quitting.  She denies she is had any fevers but sometimes she gets chills.  She reports that she was very restless last night and had difficulty sleeping. Past Medical History:  Diagnosis Date  . Anxiety   . Bipolar disorder (HCC)   . Bulimia   . Depression   . GERD (gastroesophageal reflux disease)   . Hypertension   . Migraines   . Panic attacks   . Pregnancy induced hypertension   . Seasonal allergies     Patient Active Problem List   Diagnosis Date Noted  . Other chest pain 08/31/2018  . Chronic hypertension affecting pregnancy 06/27/2018  . Excessive weight gain during pregnancy in second trimester 03/16/2018  . Chronic hypertension during pregnancy, antepartum 12/23/2017  . Supervision of other normal pregnancy, antepartum 10/31/2017  . Bipolar disorder (HCC) 10/07/2017  . Skin tag 10/07/2017  . Seasonal allergies 08/18/2017  . Essential hypertension 12/03/2016  . Stress 12/11/2015  . Panic attacks 12/11/2015  . Obesity 12/11/2015  . Elevated BP without diagnosis of hypertension 12/11/2015    Past Surgical History:  Procedure Laterality Date  . TONSILLECTOMY    . TYMPANOSTOMY TUBE PLACEMENT Bilateral      OB History    Gravida  2   Para  2   Term  2   Preterm      AB      Living   2     SAB      TAB      Ectopic      Multiple  0   Live Births  2            Home Medications    Prior to Admission medications   Medication Sig Start Date End Date Taking? Authorizing Provider  acetaminophen (TYLENOL) 325 MG tablet Take 650 mg by mouth every 6 (six) hours as needed for moderate pain.   Yes [provider]  albuterol (PROVENTIL HFA;VENTOLIN HFA) 108 (90 Base) MCG/ACT inhaler Inhale 1-2 puffs into the lungs every 6 (six) hours as needed for wheezing or shortness of breath. 05/15/18  Yes Donette Larry, CNM  calcium carbonate (TUMS - DOSED IN MG ELEMENTAL CALCIUM) 500 MG chewable tablet Chew 2 tablets by mouth 2 (two) times daily as needed for indigestion or heartburn.   Yes [provider]  hydrochlorothiazide (MICROZIDE) 12.5 MG capsule Take 1 capsule (12.5 mg total) by mouth daily. 08/31/18  Yes Diallo, Abdoulaye, MD  ibuprofen (ADVIL,MOTRIN) 800 MG tablet Take 1 tablet (800 mg total) by mouth 3 (three) times daily. 06/29/18  Yes Rhett Bannister S, DO  loratadine (CLARITIN) 10 MG tablet Take 1 tablet (10 mg total) by mouth daily. 06/21/18  Yes [name], [place], DO  Prenatal MV &  Min w/FA-DHA (PRENATAL ADULT GUMMY/DHA/FA) 0.4-25 MG CHEW Chew 1 tablet by mouth daily. 10/31/17  Yes Freddrick MarchAmin, Yashika, MD  citalopram (CELEXA) 20 MG tablet Take 1 tablet (20 mg total) by mouth daily. Patient not taking: Reported on 09/02/2018 06/29/18   Tamera StandsWallace, Laurel S, DO  famotidine (PEPCID) 20 MG tablet Take 1 tablet (20 mg total) by mouth 2 (two) times daily. 09/02/18   Arby BarrettePfeiffer, Enriqueta Augusta, MD  fluticasone (FLONASE) 50 MCG/ACT nasal spray Place 1 spray into both nostrils daily. Patient not taking: Reported on 09/02/2018 06/21/18   [name], [place], DO  verapamil (CALAN-SR) 120 MG CR tablet Take 1 tablet (120 mg total) by mouth daily. 06/29/18 08/28/18  Tamera StandsWallace, Laurel S, DO    Family History Family History  Problem Relation Age of Onset  . Hypertension Mother   . Heart attack  Mother   . Drug abuse Father   . High Cholesterol Brother   . Diabetes Paternal Grandmother   . High blood pressure Paternal Grandmother   . Heart disease Paternal Grandmother   . Cancer Paternal Grandmother   . Heart disease Maternal Uncle     Social History Social History   Tobacco Use  . Smoking status: Former Smoker    Packs/day: 0.25    Years: 3.00    Pack years: 0.75    Types: Cigarettes  . Smokeless tobacco: Never Used  . Tobacco comment: working on quitting, at 4/day now - 11/16/17. Trying to quit, 1 cig/day  Substance Use Topics  . Alcohol use: No    Comment: occ  . Drug use: No     Allergies   Augmentin [amoxicillin-pot clavulanate]   Review of Systems Review of Systems 10 Systems reviewed and are negative for acute change except as noted in the HPI. ]  Physical Exam Updated Vital Signs BP (!) 146/94 (BP Location: Right Arm)   Pulse 91   Temp 98.4 F (36.9 C) (Oral)   Resp 18   Ht 5\' 9"  (1.753 m)   Wt (!) 138.3 kg   LMP 08/31/2018   SpO2 99%   BMI 45.04 kg/m   Physical Exam Constitutional:      Appearance: She is well-developed.  HENT:     Head: Normocephalic and atraumatic.  Neck:     Musculoskeletal: Neck supple.  Cardiovascular:     Rate and Rhythm: Normal rate and regular rhythm.     Heart sounds: Normal heart sounds.  Pulmonary:     Effort: Pulmonary effort is normal.     Breath sounds: Normal breath sounds.  Abdominal:     General: Bowel sounds are normal. There is no distension.     Palpations: Abdomen is soft.     Tenderness: There is no abdominal tenderness.  Musculoskeletal: Normal range of motion.  Skin:    General: Skin is warm and dry.  Neurological:     Mental Status: She is alert and oriented to person, place, and time.     GCS: GCS eye subscore is 4. GCS verbal subscore is 5. GCS motor subscore is 6.     Coordination: Coordination normal.      ED Treatments / Results  Labs (all labs ordered are listed, but only  abnormal results are displayed) Labs Reviewed  URINALYSIS, ROUTINE W REFLEX MICROSCOPIC - Abnormal; Notable for the following components:      Result Value   Hgb urine dipstick SMALL (*)    Ketones, ur 5 (*)    All other components within normal limits  I-STAT CHEM 8, ED - Abnormal; Notable for the following components:   Calcium, Ion 1.12 (*)    All other components within normal limits  D-DIMER, QUANTITATIVE (NOT AT [facility])  RAPID URINE DRUG SCREEN, HOSP PERFORMED  I-STAT BETA HCG BLOOD, ED ([facility], [facility], [facility] ONLY)    EKG EKG Interpretation  Date/Time:  Saturday Sep 02 2018 10:27:21 EDT Ventricular Rate:  83 PR Interval:    QRS Duration: 105 QT Interval:  392 QTC Calculation: 461 R Axis:   75 Text Interpretation:  Sinus rhythm normal, no change from previous Confirmed by Arby Barrette [phone]) on 09/02/2018 12:31:01 PM   Radiology No results found.  Procedures Procedures (including critical care time)  Medications Ordered in ED Medications - No data to display   Initial Impression / Assessment and Plan / ED Course  I have reviewed the triage vital signs and the nursing notes.  Pertinent labs & imaging results that were available during my care of the patient were reviewed by me and considered in my medical decision making (see chart for details).        Patient is about 2 months postpartum.  She is not breast-feeding.  He has been having chest discomfort and pressure with sensation of shortness of breath.  Outpatient echo was done yesterday.  This was a normal study.  She does not have respiratory distress objectively.  D-dimer obtained and normal.  Patient is clinically well in appearance.  No signs of infectious illness.  Advised that she needs to continue to follow-up with her PCP and GYN.  Suggest a trial of 2 weeks of Pepcid for possible atypical GERD symptoms.  Return precautions reviewed.  Patient has diastolic hypertension that is being managed by her PCP.  No signs of  endorgan damage or acute complications.  Final Clinical Impressions(s) / ED Diagnoses   Final diagnoses:  Other chest pain  Dyspnea, unspecified type  Diastolic hypertension    ED Discharge Orders         Ordered    famotidine (PEPCID) 20 MG tablet  2 times daily     09/02/18 1223           Arby Barrette, MD 09/02/18 1232

## 2018-09-05 ENCOUNTER — Ambulatory Visit (INDEPENDENT_AMBULATORY_CARE_PROVIDER_SITE_OTHER): Payer: Medicaid Other | Admitting: Family Medicine

## 2018-09-05 ENCOUNTER — Other Ambulatory Visit: Payer: Self-pay

## 2018-09-05 DIAGNOSIS — I10 Essential (primary) hypertension: Secondary | ICD-10-CM | POA: Diagnosis not present

## 2018-09-05 NOTE — Progress Notes (Signed)
   Subjective:    Patient ID: Kylie Bailey, female    DOB: November 09, 1990, 28 y.o.   MRN: [id number]   CC: Follow up for new medication  HPI: Patient is a 28 year old female who is here today to follow-up on recent initiation of HCTZ for high blood pressure.  Patient was seen in clinic about a week ago for substernal chest pain concerning for ACS.  Work-up was negative.  Symptoms were thought to be secondary to uncontrolled hypertension.  Patient was started on low-dose HCTZ, but reports nausea, cold and not feeling like himself since initiation.  Patient stopped taking medication on Saturday.  She is currently feeling much better has been more active and eating better. She reports at times she does have sone stress and anxiety related to caring for her kids because she does not have a lot of support but reports she is doing much better from that standpoint. She has Celexa but has not felt the need to use it. She currently denies any chest pain, SOB, abdominal pain, nausea, vomiting, headache, dizziness.  Smoking status reviewed   ROS: all other systems were reviewed and are negative other than in the HPI   Past Medical History:  Diagnosis Date  . Anxiety   . Bipolar disorder (HCC)   . Bulimia   . Depression   . GERD (gastroesophageal reflux disease)   . Hypertension   . Migraines   . Panic attacks   . Pregnancy induced hypertension   . Seasonal allergies     Past Surgical History:  Procedure Laterality Date  . TONSILLECTOMY    . TYMPANOSTOMY TUBE PLACEMENT Bilateral     Past medical history, surgical, family, and social history reviewed and updated in the EMR as appropriate.  Objective:  BP 120/74   Pulse 88   Ht 5\' 9"  (1.753 m)   Wt (!) 305 lb 8 oz (138.6 kg)   LMP 08/24/2018   SpO2 99%   BMI 45.11 kg/m   Vitals and nursing note reviewed  General: NAD, pleasant, able to participate in exam Cardiac: RRR, normal heart sounds, no murmurs. 2+ radial and PT pulses  bilaterally Respiratory: CTAB, normal effort, No wheezes, rales or rhonchi Abdomen: soft, nontender, nondistended, no hepatic or splenomegaly, +BS Extremities: no edema or cyanosis. WWP. Skin: warm and dry, no rashes noted Neuro: alert and oriented x4, no focal deficits Psych: Normal affect and mood   Assessment & Plan:   Essential hypertension BP today 120/74. BP appears to be well controlled today. Given reaction to HCTZ, will discontinue. Continue to be active and working on lifestyle change. Will monitor BP and reassess need for medications at next office visit.     Lovena Neighbours, MD [facility] PGY-3

## 2018-09-05 NOTE — Assessment & Plan Note (Signed)
BP today 120/74. BP appears to be well controlled today. Given reaction to HCTZ, will discontinue. Continue to be active and working on lifestyle change. Will monitor BP and reassess need for medications at next office visit.

## 2018-09-14 ENCOUNTER — Ambulatory Visit: Payer: Medicaid Other | Admitting: Obstetrics and Gynecology

## 2018-09-25 NOTE — Telephone Encounter (Signed)
albuterol (VENTOLIN HFA) 108 (90 Base) MCG/ACT inhaler       Sig: Inhale 1-2 puffs into the lungs every 6 (six) hours as needed for wheezing or shortness of breath.   Disp:  1 Inhaler  Refills:  0   Start: 07/25/2018   Class: Normal   Non-formulary For: Acute bronchitis, unspecified organism   Last ordered: 4 months ago by Julianne Handler, CNM      To be filled at: [facility] [phone] - [place], [place] - [place]

## 2018-12-04 ENCOUNTER — Encounter: Payer: Self-pay | Admitting: Family Medicine

## 2018-12-04 ENCOUNTER — Telehealth (INDEPENDENT_AMBULATORY_CARE_PROVIDER_SITE_OTHER): Payer: Medicaid Other | Admitting: Family Medicine

## 2018-12-04 ENCOUNTER — Other Ambulatory Visit: Payer: Self-pay

## 2018-12-04 DIAGNOSIS — J989 Respiratory disorder, unspecified: Secondary | ICD-10-CM | POA: Diagnosis not present

## 2018-12-04 MED ORDER — ALBUTEROL SULFATE HFA 108 (90 BASE) MCG/ACT IN AERS: 2.0000 | INHALATION_SPRAY | RESPIRATORY_TRACT | 0 refills | Status: DC | PRN

## 2018-12-04 NOTE — Progress Notes (Signed)
[place] Telemedicine Visit  Patient consented to have virtual visit. Method of visit: Telephone  Encounter participants: Patient: Kylie Bailey - located at home Provider: [place] Cait Locust - located at [facility]  Others (if applicable): none  Chief Complaint: cough, facial pain  HPI:  Patient reports she caught something last week from her kids, about 5 days ago. She said it just settled in her chest she reports some shortness of breath and cough. She reports a productive cough. She has tried mucinex. She has no thermometer but has felt hot. She denies any body aches, reports her face has been hurting as well for the 5 days, just on side. SOB is only with exertion. She says the mucinex helps some but she still feels pressure in her chest. Her children were sick last week but no other sick. No known contact with anyone with covid.   ROS: per HPI  Pertinent PMHx: none  Exam:  Respiratory: able to speak in complete sentences without issue, occasional cough  Assessment/Plan:  [id number] for COVID testing placed.  Patient advised of 3 locations for drive up testing including:  [facility] (old [facility] in [place]) [place], [place] [address]  Patient advised of hours 8am-4pm and that they should be in line by 3 PM. -ED precautions discussed and patient expressed good understanding - continue Tylenol/ Motrin as needed for discomfort, nasal saline to help with his nasal congestion, Honey for cough, Stressed hydration -Concerned that patient could also have developing sinus infection.  If she worsens or does not improve within the next 5 days patient is to call the office back for possible need of antibiotics given her facial pain and pressure -Patient counseled on wearing a mask, washing hands -Patient instructed to avoid others until she meets criteria for ending isolation after any suspected COVID, which are:  -3 days with no fever  and  -Respiratory symptoms have improved (e.g. cough, shortness of breath) or  -10 days since symptoms first appeared   Time spent during visit with patient: 15 minutes  [place] Shaylie Eklund, DO PGY-3, [place]

## 2018-12-05 ENCOUNTER — Encounter: Payer: Self-pay | Admitting: Family Medicine

## 2018-12-05 ENCOUNTER — Other Ambulatory Visit: Payer: Self-pay | Admitting: Emergency Medicine

## 2018-12-05 DIAGNOSIS — R6889 Other general symptoms and signs: Secondary | ICD-10-CM | POA: Diagnosis not present

## 2018-12-05 DIAGNOSIS — Z20822 Contact with and (suspected) exposure to covid-19: Secondary | ICD-10-CM

## 2018-12-06 ENCOUNTER — Telehealth (INDEPENDENT_AMBULATORY_CARE_PROVIDER_SITE_OTHER): Payer: Medicaid Other | Admitting: Family Medicine

## 2018-12-06 ENCOUNTER — Other Ambulatory Visit: Payer: Self-pay

## 2018-12-06 DIAGNOSIS — N3000 Acute cystitis without hematuria: Secondary | ICD-10-CM

## 2018-12-06 DIAGNOSIS — N390 Urinary tract infection, site not specified: Secondary | ICD-10-CM | POA: Insufficient documentation

## 2018-12-06 LAB — NOVEL CORONAVIRUS, NAA: SARS-CoV-2, NAA: NOT DETECTED

## 2018-12-06 MED ORDER — NITROFURANTOIN MONOHYD MACRO 100 MG PO CAPS: 100.0000 mg | ORAL_CAPSULE | Freq: Two times a day (BID) | ORAL | 0 refills | Status: AC

## 2018-12-06 NOTE — Progress Notes (Signed)
[place] Telemedicine Visit I connected with  Jerolyn Center on 12/06/18 by a video enabled telemedicine application and verified that I am speaking with the correct person using two identifiers.   I discussed the limitations of evaluation and management by telemedicine. The patient expressed understanding and agreed to proceed.   Patient consented to have virtual visit. Method of visit: Telephone  Encounter participants: Patient: Kylie Bailey - located at Home Provider: Caroline More - located at [facility] Others (if applicable): None  Chief Complaint: Concern for UTI  HPI: Concern for UTI Patient reports she thinks she has a UTI. Has issues when going to the bathroom. Reports urinary urgency. Reports that she also has burning on urination. Reports urinary frequency. Denies blood. Reports "a little bit" of back pain after she urinates, but it is lower towards her sacrum. Denies fevers. Reports pelvic pain. Patient is sexually active, but denies vaginal discharge. Denies exposure to STD.   ROS: per HPI  Pertinent PMHx: allergy to augmentin   Exam:  Respiratory: Speaking full sentences, no respiratory distress,  Assessment/Plan:  UTI (urinary tract infection) Patient with likely uncomplicated UTI.  Reports "back pain" but states this only occurs when she urinates and is only a little bit.  Back pain is also towards her sacrum so less likely CVA tenderness.  No fevers which is also reassuring.  Patient has an allergy to Augmentin so there may be a cross reaction to cephalosporins.  Patient reports she thinks she may have taken Keflex in the past but is nervous to try again as she had a poor reaction to Augmentin.  I discussed Macrobid with patient, she states she recently took this and did well with it.  Will prescribe Macrobid x5 days.  Strict return precautions given.  Follow-up if no improvement.  Patient could not do in person visit today because  she had congestion.  Did get a COVID test today and states if it is negative and she continues to have UTI symptoms she will follow-up in clinic.    Time spent during visit with patient: 10 minutes

## 2018-12-06 NOTE — Assessment & Plan Note (Signed)
Patient with likely uncomplicated UTI.  Reports "back pain" but states this only occurs when she urinates and is only a little bit.  Back pain is also towards her sacrum so less likely CVA tenderness.  No fevers which is also reassuring.  Patient has an allergy to Augmentin so there may be a cross reaction to cephalosporins.  Patient reports she thinks she may have taken Keflex in the past but is nervous to try again as she had a poor reaction to Augmentin.  I discussed Macrobid with patient, she states she recently took this and did well with it.  Will prescribe Macrobid x5 days.  Strict return precautions given.  Follow-up if no improvement.  Patient could not do in person visit today because she had congestion.  Did get a COVID test today and states if it is negative and she continues to have UTI symptoms she will follow-up in clinic.

## 2019-01-05 DIAGNOSIS — H1013 Acute atopic conjunctivitis, bilateral: Secondary | ICD-10-CM | POA: Diagnosis not present

## 2019-03-08 ENCOUNTER — Other Ambulatory Visit: Payer: Self-pay

## 2019-03-08 ENCOUNTER — Ambulatory Visit
Admission: EM | Admit: 2019-03-08 | Discharge: 2019-03-08 | Disposition: A | Discharge status: Home / Self Care | Payer: Medicaid Other | Attending: Emergency Medicine | Admitting: Emergency Medicine

## 2019-03-08 DIAGNOSIS — Z20828 Contact with and (suspected) exposure to other viral communicable diseases: Secondary | ICD-10-CM

## 2019-03-08 DIAGNOSIS — Z20822 Contact with and (suspected) exposure to covid-19: Secondary | ICD-10-CM

## 2019-03-08 NOTE — Discharge Instructions (Addendum)
COVID testing ordered.  It will take between 5-7 days for test results.  Someone will contact you regarding abnormal results.   ° °In the meantime: °You should remain isolated in your home for 10 days from symptom onset AND greater than 72 hours after symptoms resolution (absence of fever without the use of fever-reducing medication and improvement in respiratory symptoms), whichever is longer °OR 14 days from exposure °Get plenty of rest and push fluids °Use OTC zyrtec for nasal congestion, runny nose, and/or sore throat °Use OTC flonase for nasal congestion and runny nose °Use medications daily for symptom relief °Use OTC medications like ibuprofen or tylenol as needed fever or pain °Call or go to the ED if you have any new or worsening symptoms such as fever, worsening cough, shortness of breath, chest tightness, chest pain, turning blue, changes in mental status, etc...  °

## 2019-03-08 NOTE — ED Provider Notes (Signed)
[facility]   [id number] 03/08/19 Arrival Time: 1210   CC: COVID exposure; covid test  SUBJECTIVE: History from: patient.  Kylie Bailey is a 28 y.o. female who presents for COVID testing.  Positive secondary exposure 5 days ago.  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  Denies recent travel.  Denies aggravating or alleviating symptoms.  Denies previous COVID infection.   Denies fever, chills, fatigue, nasal congestion, rhinorrhea, sore throat, cough, SOB, wheezing, chest pain, nausea, vomiting, changes in bowel or bladder habits.     ROS: As per HPI.  All other pertinent ROS negative.     Past Medical History:  Diagnosis Date  . Anxiety   . Bipolar disorder (HCC)   . Bulimia   . Depression   . GERD (gastroesophageal reflux disease)   . Hypertension   . Migraines   . Panic attacks   . Pregnancy induced hypertension   . Seasonal allergies    Past Surgical History:  Procedure Laterality Date  . TONSILLECTOMY    . TYMPANOSTOMY TUBE PLACEMENT Bilateral    Allergies  Allergen Reactions  . Augmentin [Amoxicillin-Pot Clavulanate] Rash   No current facility-administered medications on file prior to encounter.    Current Outpatient Medications on File Prior to Encounter  Medication Sig Dispense Refill  . acetaminophen (TYLENOL) 325 MG tablet Take 650 mg by mouth every 6 (six) hours as needed for moderate pain.    Marland Kitchen albuterol (PROVENTIL HFA;VENTOLIN HFA) 108 (90 Base) MCG/ACT inhaler Inhale 1-2 puffs into the lungs every 6 (six) hours as needed for wheezing or shortness of breath. 1 Inhaler 0  . albuterol (VENTOLIN HFA) 108 (90 Base) MCG/ACT inhaler Inhale 2 puffs into the lungs every 4 (four) hours as needed for wheezing or shortness of breath. 18 g 0  . calcium carbonate (TUMS - DOSED IN MG ELEMENTAL CALCIUM) 500 MG chewable tablet Chew 2 tablets by mouth 2 (two) times daily as needed for indigestion or heartburn.    . citalopram (CELEXA) 20  MG tablet Take 1 tablet (20 mg total) by mouth daily. (Patient not taking: Reported on 09/02/2018) 30 tablet 1  . fluticasone (FLONASE) 50 MCG/ACT nasal spray Place 1 spray into both nostrils daily. (Patient not taking: Reported on 09/02/2018) 16 g 2  . ibuprofen (ADVIL,MOTRIN) 800 MG tablet Take 1 tablet (800 mg total) by mouth 3 (three) times daily. 30 tablet 0  . Prenatal MV & Min w/FA-DHA (PRENATAL ADULT GUMMY/DHA/FA) 0.4-25 MG CHEW Chew 1 tablet by mouth daily. 90 tablet 3  . [DISCONTINUED] famotidine (PEPCID) 20 MG tablet Take 1 tablet (20 mg total) by mouth 2 (two) times daily. 30 tablet 0  . [DISCONTINUED] hydrochlorothiazide (MICROZIDE) 12.5 MG capsule Take 1 capsule (12.5 mg total) by mouth daily. 30 capsule 2  . [DISCONTINUED] loratadine (CLARITIN) 10 MG tablet Take 1 tablet (10 mg total) by mouth daily. 30 tablet 11  . [DISCONTINUED] verapamil (CALAN-SR) 120 MG CR tablet Take 1 tablet (120 mg total) by mouth daily. 30 tablet 1   Social History   Socioeconomic History  . Marital status: Married    Spouse name: Travanti  . Number of children: 1  . Years of education: [employer]  . Highest education level: 12th grade  Occupational History  . Occupation: Stay Home Mom  Social Needs  . Financial resource strain: Not very hard  . Food insecurity    Worry: Sometimes true    Inability: Sometimes true  . Transportation needs  Medical: Yes    Non-medical: Yes  Tobacco Use  . Smoking status: Former Smoker    Packs/day: 0.25    Years: 3.00    Pack years: 0.75    Types: Cigarettes  . Smokeless tobacco: Never Used  . Tobacco comment: working on quitting, at 4/day now - 11/16/17. Trying to quit, 1 cig/day  Substance and Sexual Activity  . Alcohol use: No    Comment: occ  . Drug use: No  . Sexual activity: Yes    Comment: last IC one week ago  Lifestyle  . Physical activity    Days per week: 0 days    Minutes per session: 0 min  . Stress: Only a little  Relationships  .  Social connections    Talks on phone: More than three times a week    Gets together: More than three times a week    Attends religious service: Never    Active member of club or organization: No    Attends meetings of clubs or organizations: Never    Relationship status: Married  . Intimate partner violence    Fear of current or ex partner: No    Emotionally abused: No    Physically abused: No    Forced sexual activity: No  Other Topics Concern  . Not on file  Social History Narrative   Lack of transportation   Family History  Problem Relation Age of Onset  . Hypertension Mother   . Heart attack Mother   . Drug abuse Father   . High Cholesterol Brother   . Diabetes Paternal Grandmother   . High blood pressure Paternal Grandmother   . Heart disease Paternal Grandmother   . Cancer Paternal Grandmother   . Heart disease Maternal Uncle     OBJECTIVE:  Vitals:   03/08/19 1220  BP: 126/86  Pulse: 99  Resp: 16  Temp: 98.9 F (37.2 C)  TempSrc: Oral  SpO2: 97%     General appearance: alert; well-appearing, nontoxic; speaking in full sentences and tolerating own secretions HEENT: NCAT; Ears: EACs clear, TMs pearly gray; Eyes: PERRL.  EOM grossly intact. Nose: nares patent without rhinorrhea, Throat: oropharynx clear, tonsils non erythematous or enlarged, uvula midline  Neck: supple without LAD Lungs: unlabored respirations, symmetrical air entry; cough: absent; no respiratory distress; CTAB Heart: regular rate and rhythm.   Skin: warm and dry Psychological: alert and cooperative; normal mood and affect  ASSESSMENT & PLAN:  1. Exposure to COVID-19 virus   2. Encounter for laboratory testing for COVID-19 virus     COVID testing ordered.  It will take between 5-7 days for test results.  Someone will contact you regarding abnormal results.    In the meantime: You should remain isolated in your home for 10 days from symptom onset AND greater than 72 hours after symptoms  resolution (absence of fever without the use of fever-reducing medication and improvement in respiratory symptoms), whichever is longer Get plenty of rest and push fluids Use OTC zyrtec for nasal congestion, runny nose, and/or sore throat Use OTC flonase for nasal congestion and runny nose Use medications daily for symptom relief Use OTC medications like ibuprofen or tylenol as needed fever or pain Call or go to the ED if you have any new or worsening symptoms such as fever, worsening cough, shortness of breath, chest tightness, chest pain, turning blue, changes in mental status, etc...   Reviewed expectations re: course of current medical issues. Questions answered. Outlined signs and  symptoms indicating need for more acute intervention. Patient verbalized understanding. After Visit Summary given.         Rennis HardingWurst, Octavio Matheney, PA-C 03/08/19 1251

## 2019-03-08 NOTE — ED Triage Notes (Signed)
Pt presents to UC stating she has had a positive covid exposure 5 days ago. Pt denies symptoms.

## 2019-03-09 ENCOUNTER — Emergency Department (HOSPITAL_COMMUNITY)
Admission: EM | Admit: 2019-03-09 | Discharge: 2019-03-09 | Disposition: A | Discharge status: Home / Self Care | Payer: Medicaid Other | Attending: Emergency Medicine | Admitting: Emergency Medicine

## 2019-03-09 ENCOUNTER — Other Ambulatory Visit: Payer: Self-pay

## 2019-03-09 ENCOUNTER — Encounter (HOSPITAL_COMMUNITY): Payer: Self-pay

## 2019-03-09 DIAGNOSIS — R202 Paresthesia of skin: Secondary | ICD-10-CM | POA: Insufficient documentation

## 2019-03-09 DIAGNOSIS — Z87891 Personal history of nicotine dependence: Secondary | ICD-10-CM | POA: Diagnosis not present

## 2019-03-09 DIAGNOSIS — T7411XA Adult physical abuse, confirmed, initial encounter: Secondary | ICD-10-CM | POA: Diagnosis not present

## 2019-03-09 DIAGNOSIS — Y929 Unspecified place or not applicable: Secondary | ICD-10-CM | POA: Insufficient documentation

## 2019-03-09 DIAGNOSIS — Z79899 Other long term (current) drug therapy: Secondary | ICD-10-CM | POA: Insufficient documentation

## 2019-03-09 DIAGNOSIS — Y939 Activity, unspecified: Secondary | ICD-10-CM | POA: Diagnosis not present

## 2019-03-09 DIAGNOSIS — S0990XA Unspecified injury of head, initial encounter: Secondary | ICD-10-CM | POA: Diagnosis present

## 2019-03-09 DIAGNOSIS — S0083XA Contusion of other part of head, initial encounter: Secondary | ICD-10-CM | POA: Insufficient documentation

## 2019-03-09 DIAGNOSIS — R42 Dizziness and giddiness: Secondary | ICD-10-CM | POA: Diagnosis not present

## 2019-03-09 DIAGNOSIS — Y999 Unspecified external cause status: Secondary | ICD-10-CM | POA: Insufficient documentation

## 2019-03-09 DIAGNOSIS — I10 Essential (primary) hypertension: Secondary | ICD-10-CM | POA: Insufficient documentation

## 2019-03-09 MED ORDER — KETOROLAC TROMETHAMINE 60 MG/2ML IM SOLN
60.0000 mg | Freq: Once | INTRAMUSCULAR | Status: AC
  Administered 2019-03-09: 02:00:00 60 mg via INTRAMUSCULAR
  Filled 2019-03-09: qty 2
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
## 2019-03-10 LAB — NOVEL CORONAVIRUS, NAA: SARS-CoV-2, NAA: NOT DETECTED

## 2019-04-28 DIAGNOSIS — H40033 Anatomical narrow angle, bilateral: Secondary | ICD-10-CM | POA: Diagnosis not present

## 2019-04-28 DIAGNOSIS — H16223 Keratoconjunctivitis sicca, not specified as Sjogren's, bilateral: Secondary | ICD-10-CM | POA: Diagnosis not present

## 2019-04-29 ENCOUNTER — Other Ambulatory Visit: Payer: Self-pay

## 2019-04-29 ENCOUNTER — Ambulatory Visit
Admission: EM | Admit: 2019-04-29 | Discharge: 2019-04-29 | Disposition: A | Discharge status: Home / Self Care | Payer: Medicaid Other | Attending: Emergency Medicine | Admitting: Emergency Medicine

## 2019-04-29 DIAGNOSIS — Z20828 Contact with and (suspected) exposure to other viral communicable diseases: Secondary | ICD-10-CM | POA: Diagnosis not present

## 2019-04-29 DIAGNOSIS — Z20822 Contact with and (suspected) exposure to covid-19: Secondary | ICD-10-CM | POA: Diagnosis not present

## 2019-04-29 NOTE — ED Provider Notes (Signed)
[facility]   [id number] 04/29/19 Arrival Time: 1105   CC: COVID exposure  SUBJECTIVE: History from: patient.  Kylie Bailey is a 29 y.o. female who presents for COVID testing.  Complains of COVID exposure last week.  Denies recent travel.  Denies aggravating or alleviating symptoms.  Denies previous COVID infection.   Denies fever, chills, fatigue, nasal congestion, rhinorrhea, sore throat, cough, SOB, wheezing, chest pain, nausea, vomiting, changes in bowel or bladder habits.    ROS: As per HPI.  All other pertinent ROS negative.     Past Medical History:  Diagnosis Date  . Anxiety   . Bipolar disorder (HCC)   . Bulimia   . Depression   . GERD (gastroesophageal reflux disease)   . Hypertension   . Migraines   . Panic attacks   . Pregnancy induced hypertension   . Seasonal allergies    Past Surgical History:  Procedure Laterality Date  . TONSILLECTOMY    . TYMPANOSTOMY TUBE PLACEMENT Bilateral    Allergies  Allergen Reactions  . Augmentin [Amoxicillin-Pot Clavulanate] Rash   No current facility-administered medications on file prior to encounter.   Current Outpatient Medications on File Prior to Encounter  Medication Sig Dispense Refill  . acetaminophen (TYLENOL) 325 MG tablet Take 650 mg by mouth every 6 (six) hours as needed for moderate pain.    Marland Kitchen albuterol (PROVENTIL HFA;VENTOLIN HFA) 108 (90 Base) MCG/ACT inhaler Inhale 1-2 puffs into the lungs every 6 (six) hours as needed for wheezing or shortness of breath. 1 Inhaler 0  . albuterol (VENTOLIN HFA) 108 (90 Base) MCG/ACT inhaler Inhale 2 puffs into the lungs every 4 (four) hours as needed for wheezing or shortness of breath. 18 g 0  . calcium carbonate (TUMS - DOSED IN MG ELEMENTAL CALCIUM) 500 MG chewable tablet Chew 2 tablets by mouth 2 (two) times daily as needed for indigestion or heartburn.    . citalopram (CELEXA) 20 MG tablet Take 1 tablet (20 mg total) by mouth daily. (Patient not taking:  Reported on 09/02/2018) 30 tablet 1  . fluticasone (FLONASE) 50 MCG/ACT nasal spray Place 1 spray into both nostrils daily. (Patient not taking: Reported on 09/02/2018) 16 g 2  . ibuprofen (ADVIL,MOTRIN) 800 MG tablet Take 1 tablet (800 mg total) by mouth 3 (three) times daily. 30 tablet 0  . Prenatal MV & Min w/FA-DHA (PRENATAL ADULT GUMMY/DHA/FA) 0.4-25 MG CHEW Chew 1 tablet by mouth daily. 90 tablet 3  . [DISCONTINUED] famotidine (PEPCID) 20 MG tablet Take 1 tablet (20 mg total) by mouth 2 (two) times daily. 30 tablet 0  . [DISCONTINUED] hydrochlorothiazide (MICROZIDE) 12.5 MG capsule Take 1 capsule (12.5 mg total) by mouth daily. 30 capsule 2  . [DISCONTINUED] loratadine (CLARITIN) 10 MG tablet Take 1 tablet (10 mg total) by mouth daily. 30 tablet 11  . [DISCONTINUED] verapamil (CALAN-SR) 120 MG CR tablet Take 1 tablet (120 mg total) by mouth daily. 30 tablet 1   Social History   Socioeconomic History  . Marital status: Married    Spouse name: Travanti  . Number of children: 1  . Years of education: [employer]  . Highest education level: 12th grade  Occupational History  . Occupation: Stay Home Mom  Tobacco Use  . Smoking status: Former Smoker    Packs/day: 0.25    Years: 3.00    Pack years: 0.75    Types: Cigarettes  . Smokeless tobacco: Never Used  . Tobacco comment: working on quitting, at 4/day  now - 11/16/17. Trying to quit, 1 cig/day  Substance and Sexual Activity  . Alcohol use: No    Comment: occ  . Drug use: No  . Sexual activity: Yes    Comment: last IC one week ago  Other Topics Concern  . Not on file  Social History Narrative   Lack of transportation   Social Determinants of Health   Financial Resource Strain:   . Difficulty of Paying Living Expenses: Not on file  Food Insecurity:   . Worried About Programme researcher, broadcasting/film/video in the Last Year: Not on file  . Ran Out of Food in the Last Year: Not on file  Transportation Needs:   . Lack of Transportation (Medical):  Not on file  . Lack of Transportation (Non-Medical): Not on file  Physical Activity:   . Days of Exercise per Week: Not on file  . Minutes of Exercise per Session: Not on file  Stress:   . Feeling of Stress : Not on file  Social Connections:   . Frequency of Communication with Friends and Family: Not on file  . Frequency of Social Gatherings with Friends and Family: Not on file  . Attends Religious Services: Not on file  . Active Member of Clubs or Organizations: Not on file  . Attends Banker Meetings: Not on file  . Marital Status: Not on file  Intimate Partner Violence:   . Fear of Current or Ex-Partner: Not on file  . Emotionally Abused: Not on file  . Physically Abused: Not on file  . Sexually Abused: Not on file   Family History  Problem Relation Age of Onset  . Hypertension Mother   . Heart attack Mother   . Drug abuse Father   . High Cholesterol Brother   . Diabetes Paternal Grandmother   . High blood pressure Paternal Grandmother   . Heart disease Paternal Grandmother   . Cancer Paternal Grandmother   . Heart disease Maternal Uncle     OBJECTIVE:  Vitals:   04/29/19 1128  BP: (!) 142/90  Pulse: 85  Resp: 18  Temp: 98.3 F (36.8 C)  SpO2: 97%     General appearance: alert; well-appearing, nontoxic; speaking in full sentences and tolerating own secretions HEENT: NCAT; Ears: EACs clear, TMs pearly gray; Eyes: PERRL.  EOM grossly intact. Nose: nares patent without rhinorrhea, Throat: oropharynx clear, tonsils non erythematous or enlarged, uvula midline  Neck: supple without LAD Lungs: unlabored respirations, symmetrical air entry; cough: absent; no respiratory distress; CTAB Heart: regular rate and rhythm.  Skin: warm and dry Psychological: alert and cooperative; normal mood and affect  ASSESSMENT & PLAN:  1. Exposure to COVID-19 virus     COVID testing ordered.  It will take between 5-7 days for test results.  Someone will contact you  regarding abnormal results.    In the meantime: You should remain isolated in your home for 10 days from symptom onset AND greater than 72 hours after symptoms resolution (absence of fever without the use of fever-reducing medication and improvement in respiratory symptoms), whichever is longer OR 14 days from exposure Get plenty of rest and push fluids Use OTC zyrtec for nasal congestion, runny nose, and/or sore throat Use OTC flonase for nasal congestion and runny nose Use medications daily for symptom relief Use OTC medications like ibuprofen or tylenol as needed fever or pain Call or go to the ED if you have any new or worsening symptoms such as fever, cough, shortness  of breath, chest tightness, chest pain, turning blue, changes in mental status, etc...   Reviewed expectations re: course of current medical issues. Questions answered. Outlined signs and symptoms indicating need for more acute intervention. Patient verbalized understanding. After Visit Summary given.         Lestine Box, PA-C 04/29/19 1130

## 2019-04-29 NOTE — Discharge Instructions (Signed)

## 2019-04-29 NOTE — ED Triage Notes (Signed)
Pt here for covid test after posiitve exposure last week  

## 2019-04-30 LAB — NOVEL CORONAVIRUS, NAA: SARS-CoV-2, NAA: NOT DETECTED

## 2019-05-09 ENCOUNTER — Ambulatory Visit
Admission: EM | Admit: 2019-05-09 | Discharge: 2019-05-09 | Disposition: A | Discharge status: Home / Self Care | Payer: Medicaid Other | Attending: Emergency Medicine | Admitting: Emergency Medicine

## 2019-05-09 ENCOUNTER — Other Ambulatory Visit: Payer: Self-pay

## 2019-05-09 DIAGNOSIS — Z20822 Contact with and (suspected) exposure to covid-19: Secondary | ICD-10-CM

## 2019-05-09 NOTE — ED Triage Notes (Signed)
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

## 2019-05-09 NOTE — ED Provider Notes (Addendum)
[facility]    CSN: [id number] Arrival date & time: 05/09/19  1653      History   Chief Complaint Covid exposure  HPI Kylie Bailey is a 29 y.o. female.   who presents for COVID testing after Covid exposure.  Denies sick exposure to  flu or strep.  Denies recent travel.  Denies aggravating or alleviating symptoms.  Denies previous COVID infection.   Denies fever, chills, fatigue, nasal congestion, rhinorrhea, sore throat, cough, SOB, wheezing, chest pain, nausea, vomiting, changes in bowel or bladder habits.       Past Medical History:  Diagnosis Date  . Anxiety   . Bipolar disorder (HCC)   . Bulimia   . Depression   . GERD (gastroesophageal reflux disease)   . Hypertension   . Migraines   . Panic attacks   . Pregnancy induced hypertension   . Seasonal allergies     Patient Active Problem List   Diagnosis Date Noted  . UTI (urinary tract infection) 12/06/2018  . Other chest pain 08/31/2018  . Chronic hypertension affecting pregnancy 06/27/2018  . Excessive weight gain during pregnancy in second trimester 03/16/2018  . Chronic hypertension during pregnancy, antepartum 12/23/2017  . Supervision of other normal pregnancy, antepartum 10/31/2017  . Bipolar disorder (HCC) 10/07/2017  . Skin tag 10/07/2017  . Seasonal allergies 08/18/2017  . Essential hypertension 12/03/2016  . Stress 12/11/2015  . Panic attacks 12/11/2015  . Obesity 12/11/2015  . Elevated BP without diagnosis of hypertension 12/11/2015    Past Surgical History:  Procedure Laterality Date  . TONSILLECTOMY    . TYMPANOSTOMY TUBE PLACEMENT Bilateral     OB History    Gravida  2   Para  2   Term  2   Preterm      AB      Living  2     SAB      TAB      Ectopic      Multiple  0   Live Births  2            Home Medications    Prior to Admission medications   Medication Sig Start Date End Date Taking? Authorizing Provider  acetaminophen (TYLENOL) 325 MG  tablet Take 650 mg by mouth every 6 (six) hours as needed for moderate pain.    [provider]  albuterol (PROVENTIL HFA;VENTOLIN HFA) 108 (90 Base) MCG/ACT inhaler Inhale 1-2 puffs into the lungs every 6 (six) hours as needed for wheezing or shortness of breath. 05/15/18   Donette Larry, CNM  albuterol (VENTOLIN HFA) 108 (90 Base) MCG/ACT inhaler Inhale 2 puffs into the lungs every 4 (four) hours as needed for wheezing or shortness of breath. 12/04/18   [name], [place], DO  calcium carbonate (TUMS - DOSED IN MG ELEMENTAL CALCIUM) 500 MG chewable tablet Chew 2 tablets by mouth 2 (two) times daily as needed for indigestion or heartburn.    [provider]  citalopram (CELEXA) 20 MG tablet Take 1 tablet (20 mg total) by mouth daily. Patient not taking: Reported on 09/02/2018 06/29/18   Tamera Stands, DO  fluticasone [facility]) 50 MCG/ACT nasal spray Place 1 spray into both nostrils daily. Patient not taking: Reported on 09/02/2018 06/21/18   [name], [place], DO  ibuprofen (ADVIL,MOTRIN) 800 MG tablet Take 1 tablet (800 mg total) by mouth 3 (three) times daily. 06/29/18   Tamera Stands, DO  Prenatal MV & Min w/FA-DHA (PRENATAL ADULT GUMMY/DHA/FA) 0.4-25 MG  CHEW Chew 1 tablet by mouth daily. 10/31/17   Lovenia Kim, MD  famotidine (PEPCID) 20 MG tablet Take 1 tablet (20 mg total) by mouth 2 (two) times daily. 09/02/18 03/08/19  Charlesetta Shanks, MD  hydrochlorothiazide (MICROZIDE) 12.5 MG capsule Take 1 capsule (12.5 mg total) by mouth daily. 08/31/18 03/08/19  Diallo, Earna Coder, MD  loratadine (CLARITIN) 10 MG tablet Take 1 tablet (10 mg total) by mouth daily. 06/21/18 03/08/19  [name], [place], DO  verapamil (CALAN-SR) 120 MG CR tablet Take 1 tablet (120 mg total) by mouth daily. 06/29/18 03/08/19  Glenice Bow, DO    Family History Family History  Problem Relation Age of Onset  . Hypertension Mother   . Heart attack Mother   . Drug abuse Father   . High Cholesterol Brother   .  Diabetes Paternal Grandmother   . High blood pressure Paternal Grandmother   . Heart disease Paternal Grandmother   . Cancer Paternal Grandmother   . Heart disease Maternal Uncle     Social History Social History   Tobacco Use  . Smoking status: Former Smoker    Packs/day: 0.25    Years: 3.00    Pack years: 0.75    Types: Cigarettes  . Smokeless tobacco: Never Used  . Tobacco comment: working on quitting, at 4/day now - 11/16/17. Trying to quit, 1 cig/day  Substance Use Topics  . Alcohol use: No    Comment: occ  . Drug use: No     Allergies   Augmentin [amoxicillin-pot clavulanate]   Review of Systems Review of Systems  Constitutional: Negative.   HENT: Negative.   Respiratory: Negative.   Cardiovascular: Negative.   Gastrointestinal: Negative.   Neurological: Negative.      Physical Exam Triage Vital Signs ED Triage Vitals  Enc Vitals Group     BP      Pulse      Resp      Temp      Temp src      SpO2      Weight      Height      Head Circumference      Peak Flow      Pain Score      Pain Loc      Pain Edu?      Excl. in [place]?    No data found.  Updated Vital Signs LMP 04/22/2019   Visual Acuity Right Eye Distance:   Left Eye Distance:   Bilateral Distance:    Right Eye Near:   Left Eye Near:    Bilateral Near:     Physical Exam Vitals and nursing note reviewed.  Constitutional:      General: She is not in acute distress.    Appearance: Normal appearance. She is normal weight. She is not ill-appearing or toxic-appearing.  HENT:     Head: Normocephalic.     Right Ear: Tympanic membrane, ear canal and external ear normal. There is no impacted cerumen.     Left Ear: Tympanic membrane, ear canal and external ear normal. There is no impacted cerumen.     Nose: Nose normal. No congestion.     Mouth/Throat:     Mouth: Mucous membranes are moist.     Pharynx: Oropharynx is clear. No oropharyngeal exudate or posterior oropharyngeal erythema.    Cardiovascular:     Rate and Rhythm: Normal rate and regular rhythm.     Pulses: Normal pulses.     Heart sounds:  Normal heart sounds. No murmur.  Pulmonary:     Effort: Pulmonary effort is normal. No respiratory distress.     Breath sounds: Normal breath sounds. No wheezing or rhonchi.  Chest:     Chest wall: No tenderness.  Abdominal:     General: Abdomen is flat. Bowel sounds are normal. There is no distension.     Palpations: There is no mass.     Tenderness: There is no abdominal tenderness.  Skin:    Capillary Refill: Capillary refill takes less than 2 seconds.  Neurological:     General: No focal deficit present.     Mental Status: She is alert and oriented to person, place, and time.      UC Treatments / Results  Labs (all labs ordered are listed, but only abnormal results are displayed) Labs Reviewed - No data to display  EKG   Radiology No results found.  Procedures Procedures (including critical care time)  Medications Ordered in UC Medications - No data to display  Initial Impression / Assessment and Plan / UC Course  I have reviewed the triage vital signs and the nursing notes.  Pertinent labs & imaging results that were available during my care of the patient were reviewed by me and considered in my medical decision making (see chart for details).    COVID-19 Exposure  COVID-19 test was ordered Advised patient to quarantine Work note was given To go to ED for worsening of symptoms  Final Clinical Impressions(s) / UC Diagnoses   Final diagnoses:  Exposure to COVID-19 virus     Discharge Instructions     COVID testing ordered.  It will take between 2-7 days for test results.  Someone will contact you regarding abnormal results.    In the meantime: You should remain isolated in your home for 10 days from symptom onset AND greater than 72 hours after symptoms resolution (absence of fever without the use of fever-reducing medication and  improvement in respiratory symptoms), whichever is longer Get plenty of rest and push fluids Use medications daily for symptom relief Use OTC medications like ibuprofen or tylenol as needed fever or pain Call or go to the ED if you have any new or worsening symptoms such as fever, worsening cough, shortness of breath, chest tightness, chest pain, turning blue, changes in mental status, etc...     ED Prescriptions    None     PDMP not reviewed this encounter.   Durward Parcel, FNP 05/09/19 1719    Durward Parcel, FNP 05/09/19 1723

## 2019-05-09 NOTE — Discharge Instructions (Addendum)

## 2019-05-10 LAB — NOVEL CORONAVIRUS, NAA: SARS-CoV-2, NAA: NOT DETECTED

## 2019-07-11 ENCOUNTER — Ambulatory Visit: Payer: Medicaid Other | Admitting: Family Medicine

## 2019-07-17 ENCOUNTER — Encounter (HOSPITAL_COMMUNITY): Payer: Self-pay | Admitting: Emergency Medicine

## 2019-07-17 ENCOUNTER — Other Ambulatory Visit: Payer: Self-pay

## 2019-07-17 ENCOUNTER — Encounter: Payer: Self-pay | Admitting: General Practice

## 2019-07-17 DIAGNOSIS — R0789 Other chest pain: Secondary | ICD-10-CM | POA: Insufficient documentation

## 2019-07-17 DIAGNOSIS — Z5321 Procedure and treatment not carried out due to patient leaving prior to being seen by health care provider: Secondary | ICD-10-CM | POA: Diagnosis not present

## 2019-07-17 NOTE — ED Triage Notes (Addendum)
Patient states chest pain that has been ongoing x 3 weeks with no known injury.

## 2019-07-18 ENCOUNTER — Emergency Department (HOSPITAL_COMMUNITY)
Admission: EM | Admit: 2019-07-18 | Discharge: 2019-07-18 | Disposition: A | Discharge status: Home / Self Care | Payer: Medicaid Other | Attending: Emergency Medicine | Admitting: Emergency Medicine

## 2019-07-18 NOTE — ED Notes (Signed)
Pt currently denying any chest pain.

## 2019-07-18 NOTE — ED Notes (Signed)
Pt LWBS after triage and being placed in a room. Pt states she cannot wait any longer due to her ride. This RN stated that it was up to her and the risks of leaving without seeing the doctor. Pt stated she understood and would come back if she started having pain again.

## 2019-08-30 DIAGNOSIS — H04123 Dry eye syndrome of bilateral lacrimal glands: Secondary | ICD-10-CM | POA: Diagnosis not present

## 2019-10-19 ENCOUNTER — Other Ambulatory Visit: Payer: Self-pay

## 2019-10-19 ENCOUNTER — Ambulatory Visit
Admission: EM | Admit: 2019-10-19 | Discharge: 2019-10-19 | Disposition: A | Discharge status: Home / Self Care | Payer: Medicaid Other | Attending: Emergency Medicine | Admitting: Emergency Medicine

## 2019-10-19 DIAGNOSIS — F419 Anxiety disorder, unspecified: Secondary | ICD-10-CM

## 2019-10-19 DIAGNOSIS — R0789 Other chest pain: Secondary | ICD-10-CM

## 2019-10-19 MED ORDER — HYDROXYZINE HCL 50 MG PO TABS: 50.0000 mg | ORAL_TABLET | Freq: Three times a day (TID) | ORAL | 0 refills | Status: DC | PRN

## 2019-10-19 NOTE — Discharge Instructions (Addendum)
EKG showed normal sinus rhythm Rest and drink fluids Eat a well-balanced diet, and avoid excessive caffeine intake Continue with hydroxyzine nightly for symptom relief Some things you may try doing to help alleviate your symptoms include: keeping a journal, exercise, talking to a friend or relative, listening to music, going for a walk or hike outside, or other activities that you may find enjoyable PCP assistance initiated Recommending further evaluation and management with PCP Return or go to ER if you have any new or worsening symptoms such as fever, chills, fatigue, worsening shortness of breath, wheezing, chest pain, nausea, vomiting, abdominal pain, changes in bowel or bladder habits, etc..Marland Kitchen

## 2019-10-19 NOTE — ED Provider Notes (Signed)
[facility]   [id number] 10/19/19 Arrival Time: 1745   Chief Complaint  Patient presents with  . Chest Pain     SUBJECTIVE: History from: patient.  Kylie Bailey is a 29 y.o. female with history of anxiety presented to the urgent care with a complaint of intermittent chest pain that radiated to left arm for the past 2 weeks.he denies chest pain.  Denies a precipitating event, or recent stresor.  Describes anxiety as heart palpitation and chest pain. Currently not taking any medication, but denies relief in symptoms.  Currently denies alleviating or aggravating factors.  Reports similar symptoms in the past.  Denies HI or SI.  Denies fever, chills, anhedonia, difficulty sleeping, changes in normal activities, nausea, vomiting, chest pain, SOB, abdominal pain, changes in bowel or bladder habits.    ROS: As per HPI.  All other pertinent ROS negative.     Past Medical History:  Diagnosis Date  . Anxiety   . Bipolar disorder (HCC)   . Bulimia   . Depression   . GERD (gastroesophageal reflux disease)   . Hypertension   . Migraines   . Panic attacks   . Pregnancy induced hypertension   . Seasonal allergies    Past Surgical History:  Procedure Laterality Date  . TONSILLECTOMY    . TYMPANOSTOMY TUBE PLACEMENT Bilateral    Allergies  Allergen Reactions  . Augmentin [Amoxicillin-Pot Clavulanate] Rash   No current facility-administered medications on file prior to encounter.   Current Outpatient Medications on File Prior to Encounter  Medication Sig Dispense Refill  . acetaminophen (TYLENOL) 325 MG tablet Take 650 mg by mouth every 6 (six) hours as needed for moderate pain.    Marland Kitchen albuterol (PROVENTIL HFA;VENTOLIN HFA) 108 (90 Base) MCG/ACT inhaler Inhale 1-2 puffs into the lungs every 6 (six) hours as needed for wheezing or shortness of breath. 1 Inhaler 0  . albuterol (VENTOLIN HFA) 108 (90 Base) MCG/ACT inhaler Inhale 2 puffs into the lungs every 4 (four) hours  as needed for wheezing or shortness of breath. 18 g 0  . calcium carbonate (TUMS - DOSED IN MG ELEMENTAL CALCIUM) 500 MG chewable tablet Chew 2 tablets by mouth 2 (two) times daily as needed for indigestion or heartburn.    . citalopram (CELEXA) 20 MG tablet Take 1 tablet (20 mg total) by mouth daily. (Patient not taking: Reported on 09/02/2018) 30 tablet 1  . fluticasone (FLONASE) 50 MCG/ACT nasal spray Place 1 spray into both nostrils daily. (Patient not taking: Reported on 09/02/2018) 16 g 2  . ibuprofen (ADVIL,MOTRIN) 800 MG tablet Take 1 tablet (800 mg total) by mouth 3 (three) times daily. 30 tablet 0  . Prenatal MV & Min w/FA-DHA (PRENATAL ADULT GUMMY/DHA/FA) 0.4-25 MG CHEW Chew 1 tablet by mouth daily. 90 tablet 3  . [DISCONTINUED] famotidine (PEPCID) 20 MG tablet Take 1 tablet (20 mg total) by mouth 2 (two) times daily. 30 tablet 0  . [DISCONTINUED] hydrochlorothiazide (MICROZIDE) 12.5 MG capsule Take 1 capsule (12.5 mg total) by mouth daily. 30 capsule 2  . [DISCONTINUED] loratadine (CLARITIN) 10 MG tablet Take 1 tablet (10 mg total) by mouth daily. 30 tablet 11  . [DISCONTINUED] verapamil (CALAN-SR) 120 MG CR tablet Take 1 tablet (120 mg total) by mouth daily. 30 tablet 1   Social History   Socioeconomic History  . Marital status: Married    Spouse name: Travanti  . Number of children: 1  . Years of education: [employer]  . Highest  education level: 12th grade  Occupational History  . Occupation: Stay Home Mom  Tobacco Use  . Smoking status: Current Every Day Smoker    Packs/day: 1.00    Years: 3.00    Pack years: 3.00    Types: Cigarettes  . Smokeless tobacco: Never Used  . Tobacco comment: working on quitting, at 4/day now - 11/16/17. Trying to quit, 1 cig/day  Vaping Use  . Vaping Use: Never used  Substance and Sexual Activity  . Alcohol use: No    Comment: occ  . Drug use: No  . Sexual activity: Yes    Comment: last IC one week ago  Other Topics Concern  . Not on  file  Social History Narrative   Lack of transportation   Social Determinants of Health   Financial Resource Strain:   . Difficulty of Paying Living Expenses:   Food Insecurity:   . Worried About Programme researcher, broadcasting/film/video in the Last Year:   . Barista in the Last Year:   Transportation Needs:   . Freight forwarder (Medical):   Marland Kitchen Lack of Transportation (Non-Medical):   Physical Activity:   . Days of Exercise per Week:   . Minutes of Exercise per Session:   Stress:   . Feeling of Stress :   Social Connections:   . Frequency of Communication with Friends and Family:   . Frequency of Social Gatherings with Friends and Family:   . Attends Religious Services:   . Active Member of Clubs or Organizations:   . Attends Banker Meetings:   Marland Kitchen Marital Status:   Intimate Partner Violence:   . Fear of Current or Ex-Partner:   . Emotionally Abused:   Marland Kitchen Physically Abused:   . Sexually Abused:    Family History  Problem Relation Age of Onset  . Hypertension Mother   . Heart attack Mother   . Drug abuse Father   . High Cholesterol Brother   . Diabetes Paternal Grandmother   . High blood pressure Paternal Grandmother   . Heart disease Paternal Grandmother   . Cancer Paternal Grandmother   . Heart disease Maternal Uncle     OBJECTIVE:  Vitals:   10/19/19 1817  BP: 125/75  Pulse: 100  Resp: 14  Temp: 98.6 F (37 C)  TempSrc: Oral  SpO2: 97%     Physical Exam Vitals and nursing note reviewed.  Constitutional:      General: She is not in acute distress.    Appearance: Normal appearance. She is normal weight. She is not ill-appearing, toxic-appearing or diaphoretic.  Cardiovascular:     Rate and Rhythm: Normal rate and regular rhythm.     Pulses: Normal pulses.     Heart sounds: Normal heart sounds. No murmur heard.  No friction rub. No gallop.   Pulmonary:     Effort: Pulmonary effort is normal. No respiratory distress.     Breath sounds: Normal  breath sounds. No stridor. No wheezing, rhonchi or rales.  Chest:     Chest wall: No tenderness.  Neurological:     Mental Status: She is alert.  Psychiatric:        Attention and Perception: Attention normal.        Mood and Affect: Mood normal. Mood is not anxious.        Speech: Speech normal.        Behavior: Behavior normal. Behavior is not agitated.  Thought Content: Thought content normal.        Judgment: Judgment normal.     LABS:  No results found for this or any previous visit (from the past 24 hour(s)).   ASSESSMENT & PLAN:  1. Anxiety   2. Atypical chest pain     Meds ordered this encounter  Medications  . hydrOXYzine (ATARAX/VISTARIL) 50 MG tablet    Sig: Take 1 tablet (50 mg total) by mouth 3 (three) times daily as needed.    Dispense:  30 tablet    Refill:  0    Patient is stable at discharge.  ED EKG was completed and showed normal sinus rhythm with no ST abnormality.  Left arm chest pain was reproducible with range of motion.  Therefore more likely related to muscle pain.  Hydroxyzine was prescribed for anxiety.  She was advised to follow PCP.   Discharge Instructions  EKG showed normal sinus rhythm Rest and drink fluids Eat a well-balanced diet, and avoid excessive caffeine intake Continue with hydroxyzine nightly for symptom relief Some things you may try doing to help alleviate your symptoms include: keeping a journal, exercise, talking to a friend or relative, listening to music, going for a walk or hike outside, or other activities that you may find enjoyable PCP assistance initiated Recommending further evaluation and management with PCP Return or go to ER if you have any new or worsening symptoms such as fever, chills, fatigue, worsening shortness of breath, wheezing, chest pain, nausea, vomiting, abdominal pain, changes in bowel or bladder habits, etc...  Reviewed expectations re: course of current medical issues. Questions  answered. Outlined signs and symptoms indicating need for more acute intervention. Patient verbalized understanding. After Visit Summary given.     Note: This document was prepared using Dragon voice recognition software and may include unintentional dictation errors.     Durward Parcel, FNP 10/19/19 1848

## 2019-10-19 NOTE — ED Triage Notes (Signed)
Patient reporting 2 weeks of intermittent chest pain radiating into the left arm/breast. Currently rates pain 0 out of 10. Denies recent injuries, illnesses. States she has recently been very anxious and had a lot of stress at home.

## 2019-11-20 ENCOUNTER — Other Ambulatory Visit: Payer: Self-pay

## 2019-11-20 ENCOUNTER — Ambulatory Visit
Admission: EM | Admit: 2019-11-20 | Discharge: 2019-11-20 | Disposition: A | Discharge status: Home / Self Care | Payer: Medicaid Other | Attending: Emergency Medicine | Admitting: Emergency Medicine

## 2019-11-20 DIAGNOSIS — H66001 Acute suppurative otitis media without spontaneous rupture of ear drum, right ear: Secondary | ICD-10-CM

## 2019-11-20 MED ORDER — AZITHROMYCIN 250 MG PO TABS: 250.0000 mg | ORAL_TABLET | Freq: Every day | ORAL | 0 refills | Status: DC

## 2019-11-20 NOTE — Discharge Instructions (Signed)
Rest and drink plenty of fluids Prescribed antibiotic.  Take as directed and to completion Take medications as directed and to completion Continue to use OTC ibuprofen and/ or tylenol as needed for pain control Follow up with PCP if symptoms persists Return here or go to the ER if you have any new or worsening symptoms

## 2019-11-20 NOTE — ED Provider Notes (Signed)
[facility]   [id number] 11/20/19 Arrival Time: 1327  CC:EAR PAIN  SUBJECTIVE: History from: patient.  BRYTANI VOTH is a 29 y.o. female who presents with of bilateral ear pain x 1 week, R>L.  Denies a precipitating event, such as swimming or wearing ear plugs.  Patient states the pain is constant and achy in character.  Denies alleviating or aggravating factors.  Reports similar symptoms in the past with ear infections.    Denies fever, chills, fatigue, sinus pain, rhinorrhea, ear discharge, sore throat, SOB, wheezing, chest pain, nausea, changes in bowel or bladder habits.    ROS: As per HPI.  All other pertinent ROS negative.     Past Medical History:  Diagnosis Date  . Anxiety   . Bipolar disorder (HCC)   . Bulimia   . Depression   . GERD (gastroesophageal reflux disease)   . Hypertension   . Migraines   . Panic attacks   . Pregnancy induced hypertension   . Seasonal allergies    Past Surgical History:  Procedure Laterality Date  . TONSILLECTOMY    . TYMPANOSTOMY TUBE PLACEMENT Bilateral    Allergies  Allergen Reactions  . Augmentin [Amoxicillin-Pot Clavulanate] Rash   No current facility-administered medications on file prior to encounter.   Current Outpatient Medications on File Prior to Encounter  Medication Sig Dispense Refill  . acetaminophen (TYLENOL) 325 MG tablet Take 650 mg by mouth every 6 (six) hours as needed for moderate pain.    Marland Kitchen albuterol (PROVENTIL HFA;VENTOLIN HFA) 108 (90 Base) MCG/ACT inhaler Inhale 1-2 puffs into the lungs every 6 (six) hours as needed for wheezing or shortness of breath. 1 Inhaler 0  . albuterol (VENTOLIN HFA) 108 (90 Base) MCG/ACT inhaler Inhale 2 puffs into the lungs every 4 (four) hours as needed for wheezing or shortness of breath. 18 g 0  . calcium carbonate (TUMS - DOSED IN MG ELEMENTAL CALCIUM) 500 MG chewable tablet Chew 2 tablets by mouth 2 (two) times daily as needed for indigestion or heartburn.    .  citalopram (CELEXA) 20 MG tablet Take 1 tablet (20 mg total) by mouth daily. (Patient not taking: Reported on 09/02/2018) 30 tablet 1  . fluticasone (FLONASE) 50 MCG/ACT nasal spray Place 1 spray into both nostrils daily. (Patient not taking: Reported on 09/02/2018) 16 g 2  . hydrOXYzine (ATARAX/VISTARIL) 50 MG tablet Take 1 tablet (50 mg total) by mouth 3 (three) times daily as needed. 30 tablet 0  . ibuprofen (ADVIL,MOTRIN) 800 MG tablet Take 1 tablet (800 mg total) by mouth 3 (three) times daily. 30 tablet 0  . Prenatal MV & Min w/FA-DHA (PRENATAL ADULT GUMMY/DHA/FA) 0.4-25 MG CHEW Chew 1 tablet by mouth daily. 90 tablet 3  . [DISCONTINUED] famotidine (PEPCID) 20 MG tablet Take 1 tablet (20 mg total) by mouth 2 (two) times daily. 30 tablet 0  . [DISCONTINUED] hydrochlorothiazide (MICROZIDE) 12.5 MG capsule Take 1 capsule (12.5 mg total) by mouth daily. 30 capsule 2  . [DISCONTINUED] loratadine (CLARITIN) 10 MG tablet Take 1 tablet (10 mg total) by mouth daily. 30 tablet 11  . [DISCONTINUED] verapamil (CALAN-SR) 120 MG CR tablet Take 1 tablet (120 mg total) by mouth daily. 30 tablet 1   Social History   Socioeconomic History  . Marital status: Married    Spouse name: Travanti  . Number of children: 1  . Years of education: [employer]  . Highest education level: 12th grade  Occupational History  . Occupation: Stay Home  Mom  Tobacco Use  . Smoking status: Current Every Day Smoker    Packs/day: 1.00    Years: 3.00    Pack years: 3.00    Types: Cigarettes  . Smokeless tobacco: Never Used  . Tobacco comment: working on quitting, at 4/day now - 11/16/17. Trying to quit, 1 cig/day  Vaping Use  . Vaping Use: Never used  Substance and Sexual Activity  . Alcohol use: No    Comment: occ  . Drug use: No  . Sexual activity: Yes    Comment: last IC one week ago  Other Topics Concern  . Not on file  Social History Narrative   Lack of transportation   Social Determinants of Health    Financial Resource Strain:   . Difficulty of Paying Living Expenses:   Food Insecurity:   . Worried About Programme researcher, broadcasting/film/video in the Last Year:   . Barista in the Last Year:   Transportation Needs:   . Freight forwarder (Medical):   Marland Kitchen Lack of Transportation (Non-Medical):   Physical Activity:   . Days of Exercise per Week:   . Minutes of Exercise per Session:   Stress:   . Feeling of Stress :   Social Connections:   . Frequency of Communication with Friends and Family:   . Frequency of Social Gatherings with Friends and Family:   . Attends Religious Services:   . Active Member of Clubs or Organizations:   . Attends Banker Meetings:   Marland Kitchen Marital Status:   Intimate Partner Violence:   . Fear of Current or Ex-Partner:   . Emotionally Abused:   Marland Kitchen Physically Abused:   . Sexually Abused:    Family History  Problem Relation Age of Onset  . Hypertension Mother   . Heart attack Mother   . Drug abuse Father   . High Cholesterol Brother   . Diabetes Paternal Grandmother   . High blood pressure Paternal Grandmother   . Heart disease Paternal Grandmother   . Cancer Paternal Grandmother   . Heart disease Maternal Uncle     OBJECTIVE:  There were no vitals filed for this visit.   General appearance: alert; well-appearing, nontoxic; speaking in full sentences and tolerating own secretions HEENT: NCAT; Ears: EACs clear, LT TM pearly gray, RT TM mildly erythematous; Eyes: PERRL.  EOM grossly intact.Nose: nares patent without rhinorrhea, Throat: oropharynx clear, tonsils non erythematous or enlarged, uvula midline  Neck: supple without LAD Lungs: unlabored respirations, symmetrical air entry; cough: absent; no respiratory distress; CTAB Heart: regular rate and rhythm.  Skin: warm and dry Psychological: alert and cooperative; normal mood and affect   ASSESSMENT & PLAN:  1. Non-recurrent acute suppurative otitis media of right ear without spontaneous  rupture of tympanic membrane     Meds ordered this encounter  Medications  . azithromycin (ZITHROMAX) 250 MG tablet    Sig: Take 1 tablet (250 mg total) by mouth daily. Take first 2 tablets together, then 1 every day until finished.    Dispense:  6 tablet    Refill:  0    Order Specific Question:   Supervising Provider    Answer:   Eustace Moore [[id number]]    Rest and drink plenty of fluids Prescribed antibiotic.  Take as directed and to completion Take medications as directed and to completion Continue to use OTC ibuprofen and/ or tylenol as needed for pain control Follow up with PCP if symptoms persists Return  here or go to the ER if you have any new or worsening symptoms   Reviewed expectations re: course of current medical issues. Questions answered. Outlined signs and symptoms indicating need for more acute intervention. Patient verbalized understanding. After Visit Summary given.         Rennis Harding, PA-C 11/20/19 1426

## 2019-11-20 NOTE — ED Triage Notes (Signed)
Pt presents with bilateral ear pain for past couple days

## 2019-12-24 ENCOUNTER — Other Ambulatory Visit: Payer: Self-pay

## 2019-12-24 ENCOUNTER — Ambulatory Visit
Admission: EM | Admit: 2019-12-24 | Discharge: 2019-12-24 | Disposition: A | Discharge status: Home / Self Care | Payer: Medicaid Other | Attending: Emergency Medicine | Admitting: Emergency Medicine

## 2019-12-24 ENCOUNTER — Telehealth: Payer: Self-pay

## 2019-12-24 DIAGNOSIS — J069 Acute upper respiratory infection, unspecified: Secondary | ICD-10-CM

## 2019-12-24 MED ORDER — BENZONATATE 100 MG PO CAPS: 100.0000 mg | ORAL_CAPSULE | Freq: Three times a day (TID) | ORAL | 0 refills | Status: DC | PRN

## 2019-12-24 NOTE — Discharge Instructions (Signed)
Take Tessalon Perles as needed for cough.    Your COVID test is pending.  You should self quarantine until the test result is back.    Take Tylenol as needed for fever or discomfort.  Rest and keep yourself hydrated.    Go to the emergency department if you develop acute worsening symptoms.

## 2019-12-24 NOTE — ED Triage Notes (Signed)
Pt is here with sinus pressure with teeth pain for 4 days now, pt has taken Mucinex to reileve discomfort.

## 2019-12-24 NOTE — ED Provider Notes (Signed)
Kylie Bailey    CSN: [id number] Arrival date & time: 12/24/19  1320      History   Chief Complaint Chief Complaint  Patient presents with  . Sinus Problem    HPI Kylie Bailey is a 29 y.o. female.   Patient presents with nonproductive cough, sinus congestion and pressure x4 days.  She has been taking Mucinex at home for her symptoms.  She denies fever, chills, shortness of breath, vomiting, diarrhea, rash, or other symptoms.  Was seen at [facility] urgent care on 11/20/2019; diagnosed with right otitis media; treated with Zithromax.  The history is provided by the patient.    Past Medical History:  Diagnosis Date  . Anxiety   . Bipolar disorder (HCC)   . Bulimia   . Depression   . GERD (gastroesophageal reflux disease)   . Hypertension   . Migraines   . Panic attacks   . Pregnancy induced hypertension   . Seasonal allergies     Patient Active Problem List   Diagnosis Date Noted  . UTI (urinary tract infection) 12/06/2018  . Other chest pain 08/31/2018  . Chronic hypertension affecting pregnancy 06/27/2018  . Excessive weight gain during pregnancy in second trimester 03/16/2018  . Chronic hypertension during pregnancy, antepartum 12/23/2017  . Supervision of other normal pregnancy, antepartum 10/31/2017  . Bipolar disorder (HCC) 10/07/2017  . Skin tag 10/07/2017  . Seasonal allergies 08/18/2017  . Essential hypertension 12/03/2016  . Stress 12/11/2015  . Panic attacks 12/11/2015  . Obesity 12/11/2015  . Elevated BP without diagnosis of hypertension 12/11/2015    Past Surgical History:  Procedure Laterality Date  . TONSILLECTOMY    . TYMPANOSTOMY TUBE PLACEMENT Bilateral     OB History    Gravida  2   Para  2   Term  2   Preterm      AB      Living  2     SAB      TAB      Ectopic      Multiple  0   Live Births  2            Home Medications    Prior to Admission medications   Medication Sig Start Date End Date  Taking? Authorizing Provider  acetaminophen (TYLENOL) 325 MG tablet Take 650 mg by mouth every 6 (six) hours as needed for moderate pain.    [provider]  albuterol (PROVENTIL HFA;VENTOLIN HFA) 108 (90 Base) MCG/ACT inhaler Inhale 1-2 puffs into the lungs every 6 (six) hours as needed for wheezing or shortness of breath. 05/15/18   Donette Larry, CNM  albuterol (VENTOLIN HFA) 108 (90 Base) MCG/ACT inhaler Inhale 2 puffs into the lungs every 4 (four) hours as needed for wheezing or shortness of breath. 12/04/18   [name], [place], DO  azithromycin (ZITHROMAX) 250 MG tablet Take 1 tablet (250 mg total) by mouth daily. Take first 2 tablets together, then 1 every day until finished. 11/20/19   Wurst, [place], PA-C  benzonatate (TESSALON) 100 MG capsule Take 1 capsule (100 mg total) by mouth 3 (three) times daily as needed for cough. 12/24/19   Mickie Bail, NP  calcium carbonate (TUMS - DOSED IN MG ELEMENTAL CALCIUM) 500 MG chewable tablet Chew 2 tablets by mouth 2 (two) times daily as needed for indigestion or heartburn.    [provider]  citalopram (CELEXA) 20 MG tablet Take 1 tablet (20 mg total) by mouth daily. Patient not  taking: Reported on 09/02/2018 06/29/18   Tamera Stands, DO  fluticasone [facility]) 50 MCG/ACT nasal spray Place 1 spray into both nostrils daily. Patient not taking: Reported on 09/02/2018 06/21/18   [name], [place], DO  hydrOXYzine (ATARAX/VISTARIL) 50 MG tablet Take 1 tablet (50 mg total) by mouth 3 (three) times daily as needed. 10/19/19   Avegno, Zachery Dakins, FNP  ibuprofen (ADVIL,MOTRIN) 800 MG tablet Take 1 tablet (800 mg total) by mouth 3 (three) times daily. 06/29/18   Tamera Stands, DO  Prenatal MV & Min w/FA-DHA (PRENATAL ADULT GUMMY/DHA/FA) 0.4-25 MG CHEW Chew 1 tablet by mouth daily. 10/31/17   Freddrick March, MD  famotidine (PEPCID) 20 MG tablet Take 1 tablet (20 mg total) by mouth 2 (two) times daily. 09/02/18 03/08/19  Arby Barrette, MD    hydrochlorothiazide (MICROZIDE) 12.5 MG capsule Take 1 capsule (12.5 mg total) by mouth daily. 08/31/18 03/08/19  Diallo, Lilia Argue, MD  loratadine (CLARITIN) 10 MG tablet Take 1 tablet (10 mg total) by mouth daily. 06/21/18 03/08/19  [name], [place], DO  verapamil (CALAN-SR) 120 MG CR tablet Take 1 tablet (120 mg total) by mouth daily. 06/29/18 03/08/19  Tamera Stands, DO    Family History Family History  Problem Relation Age of Onset  . Hypertension Mother   . Heart attack Mother   . Drug abuse Father   . High Cholesterol Brother   . Diabetes Paternal Grandmother   . High blood pressure Paternal Grandmother   . Heart disease Paternal Grandmother   . Cancer Paternal Grandmother   . Heart disease Maternal Uncle     Social History Social History   Tobacco Use  . Smoking status: Current Every Day Smoker    Packs/day: 1.00    Years: 3.00    Pack years: 3.00    Types: Cigarettes  . Smokeless tobacco: Never Used  Vaping Use  . Vaping Use: Never used  Substance Use Topics  . Alcohol use: Yes  . Drug use: No     Allergies   Augmentin [amoxicillin-pot clavulanate]   Review of Systems Review of Systems  Constitutional: Negative for chills and fever.  HENT: Positive for congestion and sinus pressure. Negative for ear pain and sore throat.   Eyes: Negative for pain and visual disturbance.  Respiratory: Positive for cough. Negative for shortness of breath.   Cardiovascular: Negative for chest pain and palpitations.  Gastrointestinal: Negative for abdominal pain, diarrhea and vomiting.  Genitourinary: Negative for dysuria and hematuria.  Musculoskeletal: Negative for arthralgias and back pain.  Skin: Negative for color change and rash.  Neurological: Negative for seizures and syncope.  All other systems reviewed and are negative.    Physical Exam Triage Vital Signs ED Triage Vitals  Enc Vitals Group     BP      Pulse      Resp      Temp      Temp src      SpO2       Weight      Height      Head Circumference      Peak Flow      Pain Score      Pain Loc      Pain Edu?      Excl. in GC?    No data found.  Updated Vital Signs BP 134/76 (BP Location: Right Arm)   Pulse (!) 110   Temp 99.5 F (37.5 C) (Oral)   Resp 19  SpO2 94%   Visual Acuity Right Eye Distance:   Left Eye Distance:   Bilateral Distance:    Right Eye Near:   Left Eye Near:    Bilateral Near:     Physical Exam Vitals and nursing note reviewed.  Constitutional:      General: She is not in acute distress.    Appearance: She is well-developed. She is obese. She is not ill-appearing.  HENT:     Head: Normocephalic and atraumatic.     Right Ear: Tympanic membrane normal.     Left Ear: Tympanic membrane normal.     Nose: Congestion present.     Mouth/Throat:     Mouth: Mucous membranes are moist.     Pharynx: Oropharynx is clear.  Eyes:     Conjunctiva/sclera: Conjunctivae normal.  Cardiovascular:     Rate and Rhythm: Normal rate and regular rhythm.     Heart sounds: No murmur heard.   Pulmonary:     Effort: Pulmonary effort is normal. No respiratory distress.     Breath sounds: Normal breath sounds.  Abdominal:     Palpations: Abdomen is soft.     Tenderness: There is no abdominal tenderness. There is no guarding or rebound.  Musculoskeletal:     Cervical back: Neck supple.  Skin:    General: Skin is warm and dry.     Findings: No rash.  Neurological:     General: No focal deficit present.     Mental Status: She is alert and oriented to person, place, and time.     Gait: Gait normal.  Psychiatric:        Mood and Affect: Mood normal.        Behavior: Behavior normal.      UC Treatments / Results  Labs (all labs ordered are listed, but only abnormal results are displayed) Labs Reviewed - No data to display  EKG   Radiology No results found.  Procedures Procedures (including critical care time)  Medications Ordered in UC Medications - No  data to display  Initial Impression / Assessment and Plan / UC Course  I have reviewed the triage vital signs and the nursing notes.  Pertinent labs & imaging results that were available during my care of the patient were reviewed by me and considered in my medical decision making (see chart for details).   URI.  Treating with Tessalon Perles.  PCR COVID pending.  Instructed patient to self quarantine until the test result is back.  Discussed symptomatic treatment including Tylenol, rest, hydration.  Instructed patient to go to the ED if she has acute worsening symptoms.  Patient agrees to plan of care.    Final Clinical Impressions(s) / UC Diagnoses   Final diagnoses:  Upper respiratory tract infection, unspecified type     Discharge Instructions     Take Tessalon Perles as needed for cough.    Your COVID test is pending.  You should self quarantine until the test result is back.    Take Tylenol as needed for fever or discomfort.  Rest and keep yourself hydrated.    Go to the emergency department if you develop acute worsening symptoms.        ED Prescriptions    Medication Sig Dispense Auth. Provider   benzonatate (TESSALON) 100 MG capsule Take 1 capsule (100 mg total) by mouth 3 (three) times daily as needed for cough. 21 capsule Mickie Bailate, Mauro Arps H, NP     PDMP not reviewed this  encounter.   Mickie Bail, NP 12/24/19 1455

## 2019-12-27 LAB — NOVEL CORONAVIRUS, NAA: SARS-CoV-2, NAA: NOT DETECTED

## 2019-12-27 LAB — SARS-COV-2, NAA 2 DAY TAT

## 2020-04-02 ENCOUNTER — Ambulatory Visit (INDEPENDENT_AMBULATORY_CARE_PROVIDER_SITE_OTHER): Payer: Medicaid Other | Admitting: Nurse Practitioner

## 2020-04-02 ENCOUNTER — Encounter: Payer: Self-pay | Admitting: Nurse Practitioner

## 2020-04-02 ENCOUNTER — Other Ambulatory Visit: Payer: Self-pay

## 2020-04-02 VITALS — BP 144/100 | HR 98 | Temp 98.2°F | Resp 20 | Ht 69.0 in | Wt 341.0 lb

## 2020-04-02 DIAGNOSIS — J209 Acute bronchitis, unspecified: Secondary | ICD-10-CM | POA: Diagnosis not present

## 2020-04-02 DIAGNOSIS — E669 Obesity, unspecified: Secondary | ICD-10-CM

## 2020-04-02 DIAGNOSIS — I10 Essential (primary) hypertension: Secondary | ICD-10-CM

## 2020-04-02 DIAGNOSIS — R0789 Other chest pain: Secondary | ICD-10-CM | POA: Diagnosis not present

## 2020-04-02 DIAGNOSIS — F419 Anxiety disorder, unspecified: Secondary | ICD-10-CM

## 2020-04-02 DIAGNOSIS — F32A Depression, unspecified: Secondary | ICD-10-CM

## 2020-04-02 DIAGNOSIS — Z3009 Encounter for other general counseling and advice on contraception: Secondary | ICD-10-CM | POA: Insufficient documentation

## 2020-04-02 DIAGNOSIS — Z7689 Persons encountering health services in other specified circumstances: Secondary | ICD-10-CM | POA: Diagnosis not present

## 2020-04-02 DIAGNOSIS — Z139 Encounter for screening, unspecified: Secondary | ICD-10-CM | POA: Diagnosis not present

## 2020-04-02 DIAGNOSIS — F3177 Bipolar disorder, in partial remission, most recent episode mixed: Secondary | ICD-10-CM

## 2020-04-02 DIAGNOSIS — J302 Other seasonal allergic rhinitis: Secondary | ICD-10-CM

## 2020-04-02 MED ORDER — CITALOPRAM HYDROBROMIDE 20 MG PO TABS: 20.0000 mg | ORAL_TABLET | Freq: Every day | ORAL | 3 refills | Status: DC

## 2020-04-02 MED ORDER — ALBUTEROL SULFATE HFA 108 (90 BASE) MCG/ACT IN AERS: 1.0000 | INHALATION_SPRAY | Freq: Four times a day (QID) | RESPIRATORY_TRACT | 0 refills | Status: DC | PRN

## 2020-04-02 NOTE — Progress Notes (Signed)
New Patient Office Visit  Subjective:  Patient ID: Kylie Bailey, female    DOB: 06-29-1990  Age: 29 y.o. MRN: [id number]  CC:  Chief Complaint  Patient presents with  . New Patient (Initial Visit)    HPI Kylie Bailey presents for new patient visit. Transferring care from [facility]. No recent medical care. No labs or physical in the last year.   She has had bad anxiety lately. Her GERD has been worse lately as well.  Past Medical History:  Diagnosis Date  . Anxiety   . Bipolar disorder (HCC)   . Bulimia   . Chronic hypertension affecting pregnancy 06/27/2018  . Chronic hypertension during pregnancy, antepartum 12/23/2017   [X]  Aspirin 81 mg daily after 12 weeks Current antihypertensives:  None   Baseline and surveillance labs (pulled in from [facility], refresh links as needed)  Lab Results Component Value Date  PLT 233 12/14/2017  CREATININE 0.97 08/10/2017  AST 19 06/04/2016  ALT 20 06/04/2016   Antenatal Testing CHTN - O10.919  Group I  BP < 140/90, no preeclampsia, AGA,  nml AFV, +/- meds    Group II BP > 140/90, on m  . Depression   . Elevated BP without diagnosis of hypertension 12/11/2015  . Excessive weight gain during pregnancy in second trimester 03/16/2018   14# weight gain in 1 month  . GERD (gastroesophageal reflux disease)   . Hypertension   . Migraines   . Panic attacks   . Pregnancy induced hypertension   . Seasonal allergies   . Supervision of other normal pregnancy, antepartum 10/31/2017    Nursing Staff Provider Office Location  [facility] Transferred to [facility] Dating   LMP Language   English  Anatomy [facility]   Flu Vaccine   declined - 12/22/17 Genetic Screen  NIPS:   AFP:   First Screen:  Quad:   TDaP vaccine   04/26/2018 Hgb A1C or  GTT Early 4.8 (12/14/17) Third trimester  Rhogam     LAB RESULTS  Feeding Plan  Breast  Blood Type A/Positive/-- (08/14 1219)  Contraception  Tubal? Antibody     Past Surgical History:  Procedure Laterality Date   . TONSILLECTOMY    . TYMPANOSTOMY TUBE PLACEMENT Bilateral     Family History  Problem Relation Age of Onset  . Hypertension Mother   . Heart attack Mother   . Drug abuse Father   . High Cholesterol Brother   . Diabetes Paternal Grandmother   . High blood pressure Paternal Grandmother   . Heart disease Paternal Grandmother   . Cancer Paternal Grandmother   . Heart disease Maternal Uncle     Social History   Socioeconomic History  . Marital status: Married    Spouse name: Travanti  . Number of children: 1  . Years of education: 1220  . Highest education level: 12th grade  Occupational History  . Occupation: Stay Home Mom  Tobacco Use  . Smoking status: Current Every Day Smoker    Packs/day: 1.00    Years: 5.00    Pack years: 5.00    Types: Cigarettes  . Smokeless tobacco: Never Used  Vaping Use  . Vaping Use: Never used  Substance and Sexual Activity  . Alcohol use: Yes    Comment: 1 glass of wine 2 nights per week  . Drug use: No  . Sexual activity: Yes    Birth control/protection: None  Other Topics Concern  . Not on file  Social  History Narrative   Lack of transportation   Social Determinants of Health   Financial Resource Strain: Not on file  Food Insecurity: Not on file  Transportation Needs: Not on file  Physical Activity: Not on file  Stress: Not on file  Social Connections: Not on file  Intimate Partner Violence: Not on file    ROS Review of Systems  Constitutional: Negative.   Respiratory: Negative.   Cardiovascular: Negative for palpitations and leg swelling.       She has occasional chest pain on her left side, but no SOB with this and it comes and goes; not occurring today  Neurological: Negative.   Psychiatric/Behavioral: Positive for dysphoric mood. Negative for self-injury and suicidal ideas. The patient is nervous/anxious.     Objective:   Today's Vitals: BP (!) 144/100   Pulse 98   Temp 98.2 F (36.8 C)   Resp 20   Ht  5\' 9"  (1.753 m)   Wt (!) 341 lb (154.7 kg)   SpO2 95%   BMI 50.36 kg/m   Physical Exam Constitutional:      Appearance: She is obese.  Cardiovascular:     Rate and Rhythm: Normal rate and regular rhythm.     Pulses: Normal pulses.     Heart sounds: Normal heart sounds.  Pulmonary:     Effort: Pulmonary effort is normal.     Breath sounds: Normal breath sounds.  Musculoskeletal:        General: Normal range of motion.  Neurological:     Mental Status: She is alert.  Psychiatric:        Mood and Affect: Mood normal.        Behavior: Behavior normal.        Thought Content: Thought content normal.        Judgment: Judgment normal.     Assessment & Plan:   Problem List Items Addressed This Visit      Cardiovascular and Mediastinum   Essential hypertension    -BP slightly elevated today at 144/11 -she has some anxiety about coming to the office today -previously took HCTZ and verapamil for her BP, but is no longer taking these -will recheck BP at time of physical        Other   Obesity    -weight 341# -likely affecting her BP      Seasonal allergies    -no issues today -has used claritin and flonase in the past -she states she is out of her albuterol inhaler; refilled today      Bipolar disorder (HCC)    -Was diagnosed with type 2 bipolar in the past by [facility] -was on lamictal in the past -if issues continue, would consider psych referral      Other chest pain    -had previous EKG and troponins that were negative -not occurring today -her verapamil was stopped by previous provider and she was started on HCTZ; no longer taking HCTZ d/t failure to f/u with provider      Anxiety and depression    -has mild anxiety on GAD-7 today as well as mild depression -she has a daughter that is currently receiving chemotherapy for cancer (?bone marrow cancer) -Rx. Citalopram as she ahs used this in the past with good effect -if no improvement in a month, will  consider psych referral with [facility] so she can get counseling as well to process her stressors      Relevant Medications   citalopram (CELEXA) 20  MG tablet    Other Visit Diagnoses    Acute bronchitis, unspecified organism       Relevant Medications   albuterol (VENTOLIN HFA) 108 (90 Base) MCG/ACT inhaler      Outpatient Encounter Medications as of 04/02/2020  Medication Sig  . acetaminophen (TYLENOL) 325 MG tablet Take 650 mg by mouth every 6 (six) hours as needed for moderate pain.  . citalopram (CELEXA) 20 MG tablet Take 1 tablet (20 mg total) by mouth daily.  Marland Kitchen ibuprofen (ADVIL,MOTRIN) 800 MG tablet Take 1 tablet (800 mg total) by mouth 3 (three) times daily.  . Prenatal MV & Min w/FA-DHA (PRENATAL ADULT GUMMY/DHA/FA) 0.4-25 MG CHEW Chew 1 tablet by mouth daily.  Marland Kitchen albuterol (VENTOLIN HFA) 108 (90 Base) MCG/ACT inhaler Inhale 1-2 puffs into the lungs every 6 (six) hours as needed for wheezing or shortness of breath.  . calcium carbonate (TUMS - DOSED IN MG ELEMENTAL CALCIUM) 500 MG chewable tablet Chew 2 tablets by mouth 2 (two) times daily as needed for indigestion or heartburn. (Patient not taking: Reported on 04/02/2020)  . [DISCONTINUED] albuterol (PROVENTIL HFA;VENTOLIN HFA) 108 (90 Base) MCG/ACT inhaler Inhale 1-2 puffs into the lungs every 6 (six) hours as needed for wheezing or shortness of breath. (Patient not taking: Reported on 04/02/2020)  . [DISCONTINUED] albuterol (VENTOLIN HFA) 108 (90 Base) MCG/ACT inhaler Inhale 2 puffs into the lungs every 4 (four) hours as needed for wheezing or shortness of breath. (Patient not taking: Reported on 04/02/2020)  . [DISCONTINUED] azithromycin (ZITHROMAX) 250 MG tablet Take 1 tablet (250 mg total) by mouth daily. Take first 2 tablets together, then 1 every day until finished. (Patient not taking: Reported on 04/02/2020)  . [DISCONTINUED] benzonatate (TESSALON) 100 MG capsule Take 1 capsule (100 mg total) by mouth 3 (three) times daily  as needed for cough. (Patient not taking: Reported on 04/02/2020)  . [DISCONTINUED] citalopram (CELEXA) 20 MG tablet Take 1 tablet (20 mg total) by mouth daily. (Patient not taking: No sig reported)  . [DISCONTINUED] famotidine (PEPCID) 20 MG tablet Take 1 tablet (20 mg total) by mouth 2 (two) times daily.  . [DISCONTINUED] fluticasone (FLONASE) 50 MCG/ACT nasal spray Place 1 spray into both nostrils daily. (Patient not taking: No sig reported)  . [DISCONTINUED] hydrochlorothiazide (MICROZIDE) 12.5 MG capsule Take 1 capsule (12.5 mg total) by mouth daily.  . [DISCONTINUED] hydrOXYzine (ATARAX/VISTARIL) 50 MG tablet Take 1 tablet (50 mg total) by mouth 3 (three) times daily as needed. (Patient not taking: Reported on 04/02/2020)  . [DISCONTINUED] loratadine (CLARITIN) 10 MG tablet Take 1 tablet (10 mg total) by mouth daily.  . [DISCONTINUED] verapamil (CALAN-SR) 120 MG CR tablet Take 1 tablet (120 mg total) by mouth daily.   No facility-administered encounter medications on file as of 04/02/2020.    Follow-up: Return in about 1 month (around 05/03/2020) for Annual Physical.   Heather Roberts, NP

## 2020-04-02 NOTE — Assessment & Plan Note (Signed)
-  weight 341# -likely affecting her BP

## 2020-04-02 NOTE — Assessment & Plan Note (Addendum)
-  BP slightly elevated today at 144/11 -she has some anxiety about coming to the office today -previously took HCTZ and verapamil for her BP, but is no longer taking these -will recheck BP at time of physical

## 2020-04-02 NOTE — Patient Instructions (Signed)
Your BP is elevated today. No medication was added due to your current stressors and anxiety. I sent in a prescription for citalopram to help with the anxiety and we will re-evaluate the BP issue in a month at the time of your physical.   Please get fasting lab work 2-3 days prior to your physical exam.

## 2020-04-02 NOTE — Assessment & Plan Note (Addendum)
-  has mild anxiety on GAD-7 today as well as mild depression -she has a daughter that is currently receiving chemotherapy for cancer (?bone marrow cancer) -Rx. Citalopram as she ahs used this in the past with good effect -if no improvement in a month, will consider psych referral with Dr. Betti Cruz so she can get counseling as well to process her stressors

## 2020-04-02 NOTE — Assessment & Plan Note (Signed)
-  will check routine labs and TSH -screen for HCV

## 2020-04-02 NOTE — Assessment & Plan Note (Signed)
-  Was diagnosed with type 2 bipolar in the past by [facility] -was on lamictal in the past -if issues continue, would consider psych referral

## 2020-04-02 NOTE — Assessment & Plan Note (Signed)
-  no issues today -has used claritin and flonase in the past -she states she is out of her albuterol inhaler; refilled today

## 2020-04-02 NOTE — Assessment & Plan Note (Signed)
-  had previous EKG and troponins that were negative -not occurring today -her verapamil was stopped by previous provider and she was started on HCTZ; no longer taking HCTZ d/t failure to f/u with provider

## 2020-04-12 ENCOUNTER — Encounter: Payer: Self-pay | Admitting: Emergency Medicine

## 2020-04-12 ENCOUNTER — Ambulatory Visit
Admission: EM | Admit: 2020-04-12 | Discharge: 2020-04-12 | Disposition: A | Discharge status: Home / Self Care | Payer: Medicaid Other | Attending: Family Medicine | Admitting: Family Medicine

## 2020-04-12 ENCOUNTER — Other Ambulatory Visit: Payer: Self-pay

## 2020-04-12 DIAGNOSIS — Z9189 Other specified personal risk factors, not elsewhere classified: Secondary | ICD-10-CM

## 2020-04-12 DIAGNOSIS — R519 Headache, unspecified: Secondary | ICD-10-CM

## 2020-04-12 DIAGNOSIS — R0981 Nasal congestion: Secondary | ICD-10-CM

## 2020-04-12 DIAGNOSIS — H66001 Acute suppurative otitis media without spontaneous rupture of ear drum, right ear: Secondary | ICD-10-CM | POA: Diagnosis not present

## 2020-04-12 DIAGNOSIS — R6889 Other general symptoms and signs: Secondary | ICD-10-CM | POA: Diagnosis not present

## 2020-04-12 MED ORDER — CEFUROXIME AXETIL 250 MG PO TABS: 250.0000 mg | ORAL_TABLET | Freq: Two times a day (BID) | ORAL | 0 refills | Status: AC

## 2020-04-12 NOTE — Discharge Instructions (Signed)
I have sent in Ceftin twice a day for 7 days  Your COVID and Flu tests are pending.  You should self quarantine until the test results are back.    Take Tylenol or ibuprofen as needed for fever or discomfort.  Rest and keep yourself hydrated.    Follow-up with your primary care provider if your symptoms are not improving.

## 2020-04-12 NOTE — ED Triage Notes (Signed)
Sinus pressure and congestion ear hurting wants to be tested for covid.

## 2020-04-14 NOTE — ED Provider Notes (Signed)
[facility]   [id number] 04/12/20 Arrival Time: 1531   CC: COVID symptoms  SUBJECTIVE: History from: patient.  Kylie Bailey is a 30 y.o. female who presents with sinus pressure, left ear pain x 2 days. Denies sick exposure to COVID, flu or strep. Denies recent travel. Has negative history of Covid. Has not completed Covid vaccines. Has taken OTC medications for this with little benefit. There are no aggravating or alleviating factors. Denies previous symptoms in the past. Denies fever, chills, fatigue, rhinorrhea, sore throat, SOB, wheezing, chest pain, nausea, changes in bowel or bladder habits.    ROS: As per HPI.  All other pertinent ROS negative.     Past Medical History:  Diagnosis Date  . Anxiety   . Bipolar disorder (HCC)   . Bulimia   . Chronic hypertension affecting pregnancy 06/27/2018  . Chronic hypertension during pregnancy, antepartum 12/23/2017   [X]  Aspirin 81 mg daily after 12 weeks Current antihypertensives:  None   Baseline and surveillance labs (pulled in from [facility], refresh links as needed)  Lab Results Component Value Date  PLT 233 12/14/2017  CREATININE 0.97 08/10/2017  AST 19 06/04/2016  ALT 20 06/04/2016   Antenatal Testing CHTN - O10.919  Group I  BP < 140/90, no preeclampsia, AGA,  nml AFV, +/- meds    Group II BP > 140/90, on m  . Depression   . Elevated BP without diagnosis of hypertension 12/11/2015  . Excessive weight gain during pregnancy in second trimester 03/16/2018   14# weight gain in 1 month  . GERD (gastroesophageal reflux disease)   . Hypertension   . Migraines   . Panic attacks   . Pregnancy induced hypertension   . Seasonal allergies   . Supervision of other normal pregnancy, antepartum 10/31/2017    Nursing Staff Provider Office Location  [facility] Transferred to [facility]ating   LMP Language   English  Anatomy [facility]   Flu Vaccine   declined - 12/22/17 Genetic Screen  NIPS:   AFP:   First Screen:  Quad:   TDaP vaccine   04/26/2018 Hgb  A1C or  GTT Early 4.8 (12/14/17) Third trimester  Rhogam     LAB RESULTS  Feeding Plan  Breast  Blood Type A/Positive/-- (08/14 1219)  Contraception  Tubal? Antibody    Past Surgical History:  Procedure Laterality Date  . TONSILLECTOMY    . TYMPANOSTOMY TUBE PLACEMENT Bilateral    Allergies  Allergen Reactions  . Augmentin [Amoxicillin-Pot Clavulanate] Rash   No current facility-administered medications on file prior to encounter.   Current Outpatient Medications on File Prior to Encounter  Medication Sig Dispense Refill  . acetaminophen (TYLENOL) 325 MG tablet Take 650 mg by mouth every 6 (six) hours as needed for moderate pain.    1220 albuterol (VENTOLIN HFA) 108 (90 Base) MCG/ACT inhaler Inhale 1-2 puffs into the lungs every 6 (six) hours as needed for wheezing or shortness of breath. 1 each 0  . calcium carbonate (TUMS - DOSED IN MG ELEMENTAL CALCIUM) 500 MG chewable tablet Chew 2 tablets by mouth 2 (two) times daily as needed for indigestion or heartburn. (Patient not taking: Reported on 04/02/2020)    . citalopram (CELEXA) 20 MG tablet Take 1 tablet (20 mg total) by mouth daily. 30 tablet 3  . ibuprofen (ADVIL,MOTRIN) 800 MG tablet Take 1 tablet (800 mg total) by mouth 3 (three) times daily. 30 tablet 0  . Prenatal MV & Min w/FA-DHA (PRENATAL ADULT GUMMY/DHA/FA) 0.4-25 MG  CHEW Chew 1 tablet by mouth daily. 90 tablet 3  . [DISCONTINUED] famotidine (PEPCID) 20 MG tablet Take 1 tablet (20 mg total) by mouth 2 (two) times daily. 30 tablet 0  . [DISCONTINUED] hydrochlorothiazide (MICROZIDE) 12.5 MG capsule Take 1 capsule (12.5 mg total) by mouth daily. 30 capsule 2  . [DISCONTINUED] loratadine (CLARITIN) 10 MG tablet Take 1 tablet (10 mg total) by mouth daily. 30 tablet 11  . [DISCONTINUED] verapamil (CALAN-SR) 120 MG CR tablet Take 1 tablet (120 mg total) by mouth daily. 30 tablet 1   Social History   Socioeconomic History  . Marital status: Married    Spouse name: Travanti  . Number of  children: 1  . Years of education: [employer]  . Highest education level: 12th grade  Occupational History  . Occupation: Stay Home Mom  Tobacco Use  . Smoking status: Current Every Day Smoker    Packs/day: 1.00    Years: 5.00    Pack years: 5.00    Types: Cigarettes  . Smokeless tobacco: Never Used  Vaping Use  . Vaping Use: Never used  Substance and Sexual Activity  . Alcohol use: Yes    Comment: 1 glass of wine 2 nights per week  . Drug use: No  . Sexual activity: Yes    Birth control/protection: None  Other Topics Concern  . Not on file  Social History Narrative   Lack of transportation   Social Determinants of Health   Financial Resource Strain: Not on file  Food Insecurity: Not on file  Transportation Needs: Not on file  Physical Activity: Not on file  Stress: Not on file  Social Connections: Not on file  Intimate Partner Violence: Not on file   Family History  Problem Relation Age of Onset  . Hypertension Mother   . Heart attack Mother   . Drug abuse Father   . High Cholesterol Brother   . Diabetes Paternal Grandmother   . High blood pressure Paternal Grandmother   . Heart disease Paternal Grandmother   . Cancer Paternal Grandmother   . Heart disease Maternal Uncle     OBJECTIVE:  Vitals:   04/12/20 1552 04/12/20 1553 04/12/20 1613  BP: (!) 136/107  (!) 136/100  Pulse: 99    Resp: 18    Temp: 97.9 F (36.6 C)    TempSrc: Oral    SpO2: 97%    Weight:  (!) 340 lb (154.2 kg)   Height:  5\' 9"  (1.753 m)      General appearance: alert; appears fatigued, but nontoxic; speaking in full sentences and tolerating own secretions HEENT: NCAT; Ears: EACs clear, R TMs erythematous, bulging, with effusion; Eyes: PERRL.  EOM grossly intact. Sinuses: nontender; Nose: nares patent with clear rhinorrhea, Throat: oropharynx erythematous, cobblestoning present, tonsils non erythematous or enlarged, uvula midline  Neck: supple without LAD Lungs: unlabored  respirations, symmetrical air entry; cough: absent; no respiratory distress; CTAB Heart: regular rate and rhythm.  Radial pulses 2+ symmetrical bilaterally Skin: warm and dry Psychological: alert and cooperative; normal mood and affect  LABS:  No results found for this or any previous visit (from the past 24 hour(s)).   ASSESSMENT & PLAN:  1. Non-recurrent acute suppurative otitis media of right ear without spontaneous rupture of tympanic membrane   2. At increased risk of exposure to COVID-19 virus   3. Nasal congestion   4. Nonintractable headache, unspecified chronicity pattern, unspecified headache type     Meds ordered this encounter  Medications  . cefUROXime (CEFTIN) 250 MG tablet    Sig: Take 1 tablet (250 mg total) by mouth 2 (two) times daily with a meal for 7 days.    Dispense:  14 tablet    Refill:  0    Order Specific Question:   Supervising Provider    Answer:   Merrilee Jansky [id number]   Prescribed Ceftin for R OM, take BID x 7 days Continue supportive care at home COVID and flu testing ordered.  It will take between 2-3 days for test results. Someone will contact you regarding abnormal results.   Work note provided Patient should remain in quarantine until they have received Covid results.  If negative you may resume normal activities (go back to work/school) while practicing hand hygiene, social distance, and mask wearing.  If positive, patient should remain in quarantine for at least 5 days from symptom onset AND greater than 72 hours after symptoms resolution (absence of fever without the use of fever-reducing medication and improvement in respiratory symptoms), whichever is longer Get plenty of rest and push fluids Use OTC zyrtec for nasal congestion, runny nose, and/or sore throat Use OTC flonase for nasal congestion and runny nose Use medications daily for symptom relief Use OTC medications like ibuprofen or tylenol as needed fever or pain Call or go to  the ED if you have any new or worsening symptoms such as fever, worsening cough, shortness of breath, chest tightness, chest pain, turning blue, changes in mental status.  Reviewed expectations re: course of current medical issues. Questions answered. Outlined signs and symptoms indicating need for more acute intervention. Patient verbalized understanding. After Visit Summary given.         Moshe Cipro, NP 04/14/20 2110

## 2020-04-15 ENCOUNTER — Telehealth (INDEPENDENT_AMBULATORY_CARE_PROVIDER_SITE_OTHER): Payer: Medicaid Other | Admitting: Nurse Practitioner

## 2020-04-15 ENCOUNTER — Other Ambulatory Visit: Payer: Self-pay

## 2020-04-15 ENCOUNTER — Encounter: Payer: Self-pay | Admitting: Nurse Practitioner

## 2020-04-15 DIAGNOSIS — U071 COVID-19: Secondary | ICD-10-CM

## 2020-04-15 LAB — COVID-19, FLU A+B NAA
Influenza A, NAA: NOT DETECTED
Influenza B, NAA: NOT DETECTED
SARS-CoV-2, NAA: DETECTED — AB

## 2020-04-15 MED ORDER — ACETAMINOPHEN 500 MG PO TABS: 500.0000 mg | ORAL_TABLET | Freq: Four times a day (QID) | ORAL | 0 refills | Status: DC | PRN

## 2020-04-15 MED ORDER — CHLORPHENIRAMINE-DM 4-30 MG PO TABS: 1.0000 | ORAL_TABLET | Freq: Four times a day (QID) | ORAL | 0 refills | Status: DC | PRN

## 2020-04-15 NOTE — Assessment & Plan Note (Addendum)
-  diagnosed with COVID on 04/10/20 -still symptomatic, but these are mild; no SOB -Rx. Tylenol and chlorpheniramine-DM (d/t high BP no Norel) -f/u PRN -discussed red flags -if CP or SOB develop, go to the ED

## 2020-04-15 NOTE — Progress Notes (Signed)
Acute Office Visit  Subjective:    Patient ID: Kylie Bailey, female    DOB: November 03, 1990, 30 y.o.   MRN: [id number]  Chief Complaint  Patient presents with  . Covid Positive    Cough, sinus congestion, body aches    HPI Patient is in today for sick visit.  She tested positive for COVID on 04/10/20. She is still experiencing sinus pressure, myalgias, and cough. She has been taking mucinex and nyquil at night.  Past Medical History:  Diagnosis Date  . Anxiety   . Bipolar disorder (HCC)   . Bulimia   . Chronic hypertension affecting pregnancy 06/27/2018  . Chronic hypertension during pregnancy, antepartum 12/23/2017   [X]  Aspirin 81 mg daily after 12 weeks Current antihypertensives:  None   Baseline and surveillance labs (pulled in from [facility], refresh links as needed)  Lab Results Component Value Date  PLT 233 12/14/2017  CREATININE 0.97 08/10/2017  AST 19 06/04/2016  ALT 20 06/04/2016   Antenatal Testing CHTN - O10.919  Group I  BP < 140/90, no preeclampsia, AGA,  nml AFV, +/- meds    Group II BP > 140/90, on m  . Depression   . Elevated BP without diagnosis of hypertension 12/11/2015  . Excessive weight gain during pregnancy in second trimester 03/16/2018   14# weight gain in 1 month  . GERD (gastroesophageal reflux disease)   . Hypertension   . Migraines   . Panic attacks   . Pregnancy induced hypertension   . Seasonal allergies   . Supervision of other normal pregnancy, antepartum 10/31/2017    Nursing Staff Provider Office Location  [facility] Transferred to [facility]ating   LMP Language   English  Anatomy [facility]   Flu Vaccine   declined - 12/22/17 Genetic Screen  NIPS:   AFP:   First Screen:  Quad:   TDaP vaccine   04/26/2018 Hgb A1C or  GTT Early 4.8 (12/14/17) Third trimester  Rhogam     LAB RESULTS  Feeding Plan  Breast  Blood Type A/Positive/-- (08/14 1219)  Contraception  Tubal? Antibody     Past Surgical History:  Procedure Laterality Date  . TONSILLECTOMY    . TYMPANOSTOMY  TUBE PLACEMENT Bilateral     Family History  Problem Relation Age of Onset  . Hypertension Mother   . Heart attack Mother   . Drug abuse Father   . High Cholesterol Brother   . Diabetes Paternal Grandmother   . High blood pressure Paternal Grandmother   . Heart disease Paternal Grandmother   . Cancer Paternal Grandmother   . Heart disease Maternal Uncle     Social History   Socioeconomic History  . Marital status: Married    Spouse name: Travanti  . Number of children: 1  . Years of education: 1220  . Highest education level: 12th grade  Occupational History  . Occupation: Stay Home Mom  Tobacco Use  . Smoking status: Current Every Day Smoker    Packs/day: 1.00    Years: 5.00    Pack years: 5.00    Types: Cigarettes  . Smokeless tobacco: Never Used  Vaping Use  . Vaping Use: Never used  Substance and Sexual Activity  . Alcohol use: Yes    Comment: 1 glass of wine 2 nights per week  . Drug use: No  . Sexual activity: Yes    Birth control/protection: None  Other Topics Concern  . Not on file  Social History Narrative  Lack of transportation   Social Determinants of Health   Financial Resource Strain: Not on file  Food Insecurity: Not on file  Transportation Needs: Not on file  Physical Activity: Not on file  Stress: Not on file  Social Connections: Not on file  Intimate Partner Violence: Not on file    Outpatient Medications Prior to Visit  Medication Sig Dispense Refill  . albuterol (VENTOLIN HFA) 108 (90 Base) MCG/ACT inhaler Inhale 1-2 puffs into the lungs every 6 (six) hours as needed for wheezing or shortness of breath. 1 each 0  . calcium carbonate (TUMS - DOSED IN MG ELEMENTAL CALCIUM) 500 MG chewable tablet Chew 2 tablets by mouth 2 (two) times daily as needed for indigestion or heartburn.    . cefUROXime (CEFTIN) 250 MG tablet Take 1 tablet (250 mg total) by mouth 2 (two) times daily with a meal for 7 days. 14 tablet 0  . citalopram  (CELEXA) 20 MG tablet Take 1 tablet (20 mg total) by mouth daily. 30 tablet 3  . ibuprofen (ADVIL,MOTRIN) 800 MG tablet Take 1 tablet (800 mg total) by mouth 3 (three) times daily. 30 tablet 0  . Prenatal MV & Min w/FA-DHA (PRENATAL ADULT GUMMY/DHA/FA) 0.4-25 MG CHEW Chew 1 tablet by mouth daily. 90 tablet 3  . acetaminophen (TYLENOL) 325 MG tablet Take 650 mg by mouth every 6 (six) hours as needed for moderate pain.     No facility-administered medications prior to visit.    Allergies  Allergen Reactions  . Augmentin [Amoxicillin-Pot Clavulanate] Rash    Review of Systems  Constitutional: Positive for chills and fatigue.  HENT: Positive for congestion, sinus pressure, sinus pain and sore throat.   Respiratory: Positive for cough. Negative for chest tightness, shortness of breath and wheezing.   Cardiovascular: Negative.   Gastrointestinal: Negative for abdominal pain, constipation and diarrhea.  Neurological: Positive for headaches.       Objective:    Physical Exam  LMP 03/18/2020  Wt Readings from Last 3 Encounters:  04/12/20 (!) 340 lb (154.2 kg)  04/02/20 (!) 341 lb (154.7 kg)  07/17/19 (!) 320 lb (145.2 kg)    Health Maintenance Due  Topic Date Due  . Hepatitis C Screening  Never done    There are no preventive care reminders to display for this patient.   Lab Results  Component Value Date   TSH 1.570 08/31/2018   Lab Results  Component Value Date   WBC 6.0 08/31/2018   HGB 13.6 09/02/2018   HCT 40.0 09/02/2018   MCV 91 08/31/2018   PLT 262 08/31/2018   Lab Results  Component Value Date   NA 139 09/02/2018   K 3.8 09/02/2018   CO2 22 08/31/2018   GLUCOSE 92 09/02/2018   BUN 15 09/02/2018   CREATININE 1.00 09/02/2018   BILITOT 0.3 08/31/2018   ALKPHOS 76 08/31/2018   AST 37 08/31/2018   ALT 63 (H) 08/31/2018   PROT 5.9 (L) 08/31/2018   ALBUMIN 4.3 08/31/2018   CALCIUM 8.9 08/31/2018   ANIONGAP 4 (L) 06/27/2018   Lab Results  Component Value  Date   CHOL 186 08/31/2018   Lab Results  Component Value Date   HDL 41 08/31/2018   Lab Results  Component Value Date   LDLCALC 133 (H) 08/31/2018   Lab Results  Component Value Date   TRIG 61 08/31/2018   Lab Results  Component Value Date   CHOLHDL 4.5 (H) 08/31/2018   Lab Results  Component  Value Date   HGBA1C 4.8 12/14/2017       Assessment & Plan:   Problem List Items Addressed This Visit      Other   COVID-19    -diagnosed with COVID on 04/10/20 -still symptomatic, but these are mild; no SOB -Rx. Tylenol and chlorpheniramine-DM (d/t high BP no Norel) -f/u PRN -discussed red flags -if CP or SOB develop, go to the ED          Meds ordered this encounter  Medications  . acetaminophen (TYLENOL) 500 MG tablet    Sig: Take 1 tablet (500 mg total) by mouth every 6 (six) hours as needed.    Dispense:  30 tablet    Refill:  0  . Chlorpheniramine-DM 4-30 MG TABS    Sig: Take 1 tablet by mouth every 6 (six) hours as needed (cough and congestion).    Dispense:  56 tablet    Refill:  0   Date:  04/15/2020   Location of Patient: Home Location of Provider: Office Consent was obtain for visit to be over via telehealth. I verified that I am speaking with the correct person using two identifiers.  I connected with  [name] on 04/15/20 via telephone and verified that I am speaking with the correct person using two identifiers.   I discussed the limitations of evaluation and management by telemedicine. The patient expressed understanding and agreed to proceed.  Time spent: 12 min   Heather Roberts, NP

## 2020-05-10 ENCOUNTER — Emergency Department (HOSPITAL_COMMUNITY): Payer: Medicaid Other

## 2020-05-10 ENCOUNTER — Encounter (HOSPITAL_COMMUNITY): Payer: Self-pay

## 2020-05-10 ENCOUNTER — Other Ambulatory Visit: Payer: Self-pay

## 2020-05-10 ENCOUNTER — Emergency Department (HOSPITAL_COMMUNITY)
Admission: EM | Admit: 2020-05-10 | Discharge: 2020-05-10 | Disposition: A | Discharge status: Home / Self Care | Payer: Medicaid Other | Attending: Emergency Medicine | Admitting: Emergency Medicine

## 2020-05-10 DIAGNOSIS — H5789 Other specified disorders of eye and adnexa: Secondary | ICD-10-CM | POA: Diagnosis present

## 2020-05-10 DIAGNOSIS — I10 Essential (primary) hypertension: Secondary | ICD-10-CM | POA: Insufficient documentation

## 2020-05-10 DIAGNOSIS — R200 Anesthesia of skin: Secondary | ICD-10-CM | POA: Diagnosis not present

## 2020-05-10 DIAGNOSIS — Z8673 Personal history of transient ischemic attack (TIA), and cerebral infarction without residual deficits: Secondary | ICD-10-CM | POA: Diagnosis not present

## 2020-05-10 DIAGNOSIS — Z79899 Other long term (current) drug therapy: Secondary | ICD-10-CM | POA: Insufficient documentation

## 2020-05-10 DIAGNOSIS — G43109 Migraine with aura, not intractable, without status migrainosus: Secondary | ICD-10-CM | POA: Diagnosis not present

## 2020-05-10 DIAGNOSIS — G43809 Other migraine, not intractable, without status migrainosus: Secondary | ICD-10-CM | POA: Insufficient documentation

## 2020-05-10 DIAGNOSIS — U071 COVID-19: Secondary | ICD-10-CM | POA: Insufficient documentation

## 2020-05-10 DIAGNOSIS — F17210 Nicotine dependence, cigarettes, uncomplicated: Secondary | ICD-10-CM | POA: Diagnosis not present

## 2020-05-10 DIAGNOSIS — H538 Other visual disturbances: Secondary | ICD-10-CM | POA: Diagnosis not present

## 2020-05-10 DIAGNOSIS — R29818 Other symptoms and signs involving the nervous system: Secondary | ICD-10-CM | POA: Diagnosis not present

## 2020-05-10 LAB — DIFFERENTIAL
Abs Immature Granulocytes: 0.03 10*3/uL (ref 0.00–0.07)
Basophils Absolute: 0.1 10*3/uL (ref 0.0–0.1)
Basophils Relative: 1 %
Eosinophils Absolute: 0.2 10*3/uL (ref 0.0–0.5)
Eosinophils Relative: 2 %
Immature Granulocytes: 0 %
Lymphocytes Relative: 26 %
Lymphs Abs: 2 10*3/uL (ref 0.7–4.0)
Monocytes Absolute: 0.6 10*3/uL (ref 0.1–1.0)
Monocytes Relative: 8 %
Neutro Abs: 4.6 10*3/uL (ref 1.7–7.7)
Neutrophils Relative %: 63 %

## 2020-05-10 LAB — I-STAT CHEM 8, ED
BUN: 14 mg/dL (ref 6–20)
Calcium, Ion: 1.23 mmol/L (ref 1.15–1.40)
Chloride: 108 mmol/L (ref 98–111)
Creatinine, Ser: 0.8 mg/dL (ref 0.44–1.00)
Glucose, Bld: 105 mg/dL — ABNORMAL HIGH (ref 70–99)
HCT: 48 % — ABNORMAL HIGH (ref 36.0–46.0)
Hemoglobin: 16.3 g/dL — ABNORMAL HIGH (ref 12.0–15.0)
Potassium: 3.9 mmol/L (ref 3.5–5.1)
Sodium: 142 mmol/L (ref 135–145)
TCO2: 22 mmol/L (ref 22–32)

## 2020-05-10 LAB — COMPREHENSIVE METABOLIC PANEL
ALT: 39 U/L (ref 0–44)
AST: 24 U/L (ref 15–41)
Albumin: 4.2 g/dL (ref 3.5–5.0)
Alkaline Phosphatase: 58 U/L (ref 38–126)
Anion gap: 6 (ref 5–15)
BUN: 15 mg/dL (ref 6–20)
CO2: 22 mmol/L (ref 22–32)
Calcium: 8.8 mg/dL — ABNORMAL LOW (ref 8.9–10.3)
Chloride: 110 mmol/L (ref 98–111)
Creatinine, Ser: 0.86 mg/dL (ref 0.44–1.00)
GFR, Estimated: >60 mL/min (ref >60)
Glucose, Bld: 110 mg/dL — ABNORMAL HIGH (ref 70–99)
Potassium: 3.7 mmol/L (ref 3.5–5.1)
Sodium: 138 mmol/L (ref 135–145)
Total Bilirubin: 0.7 mg/dL (ref 0.3–1.2)
Total Protein: 7.1 g/dL (ref 6.5–8.1)

## 2020-05-10 LAB — CBC
HCT: 47.1 % — ABNORMAL HIGH (ref 36.0–46.0)
Hemoglobin: 16.2 g/dL — ABNORMAL HIGH (ref 12.0–15.0)
MCH: 32 pg (ref 26.0–34.0)
MCHC: 34.4 g/dL (ref 30.0–36.0)
MCV: 92.9 fL (ref 80.0–100.0)
Platelets: 254 10*3/uL (ref 150–400)
RBC: 5.07 MIL/uL (ref 3.87–5.11)
RDW: 12.9 % (ref 11.5–15.5)
WBC: 7.5 10*3/uL (ref 4.0–10.5)
nRBC: 0 % (ref 0.0–0.2)

## 2020-05-10 LAB — SARS CORONAVIRUS 2 BY RT PCR (HOSPITAL ORDER, PERFORMED IN ~~LOC~~ HOSPITAL LAB): SARS Coronavirus 2: POSITIVE — AB

## 2020-05-10 LAB — ETHANOL: Alcohol, Ethyl (B): <10 mg/dL (ref <10)

## 2020-05-10 LAB — CBG MONITORING, ED: Glucose-Capillary: 106 mg/dL — ABNORMAL HIGH (ref 70–99)

## 2020-05-10 LAB — PROTIME-INR
INR: 1 (ref 0.8–1.2)
Prothrombin Time: 12.4 seconds (ref 11.4–15.2)

## 2020-05-10 LAB — APTT: aPTT: 27 seconds (ref 24–36)

## 2020-05-10 MED ORDER — DEXAMETHASONE SODIUM PHOSPHATE 10 MG/ML IJ SOLN
10.0000 mg | Freq: Once | INTRAMUSCULAR | Status: AC
  Administered 2020-05-10: 10 mg via INTRAVENOUS
  Filled 2020-05-10: qty 1

## 2020-05-10 MED ORDER — SODIUM CHLORIDE 0.9 % IV SOLN: INTRAVENOUS | Status: DC

## 2020-05-10 MED ORDER — DIPHENHYDRAMINE HCL 50 MG/ML IJ SOLN
25.0000 mg | Freq: Once | INTRAMUSCULAR | Status: AC
  Administered 2020-05-10: 25 mg via INTRAVENOUS
  Filled 2020-05-10: qty 1

## 2020-05-10 MED ORDER — METOCLOPRAMIDE HCL 5 MG/ML IJ SOLN
10.0000 mg | Freq: Once | INTRAMUSCULAR | Status: AC
  Administered 2020-05-10: 10 mg via INTRAVENOUS
  Filled 2020-05-10: qty 2

## 2020-05-10 MED ORDER — IOHEXOL 350 MG/ML SOLN
75.0000 mL | Freq: Once | INTRAVENOUS | Status: AC | PRN
  Administered 2020-05-10: 75 mL via INTRAVENOUS

## 2020-05-10 MED ORDER — SODIUM CHLORIDE 0.9 % IV BOLUS
1000.0000 mL | Freq: Once | INTRAVENOUS | Status: AC
  Administered 2020-05-10: 1000 mL via INTRAVENOUS

## 2020-05-10 NOTE — Discharge Instructions (Addendum)
Work-up here today negative.  Symptoms highly suggestive of a complicated migraine.  Go home and rest.  Symptoms should all resolve by tomorrow.  They can appointment follow-up with neurology.  Return for any new or worse symptoms.

## 2020-05-10 NOTE — Consult Note (Signed)
[facility] Telemedicine Consult   Requesting Provider: Darlyn Chamber Consult Participants: Shawna Orleans (RN), patient Location of the provider: [facility] Location of the patient: [facility] emergency department  This consult was provided via telemedicine with 2-way video and audio communication. The patient/family was informed that care would be provided in this way and agreed to receive care in this manner.    Chief Complaint: Left-sided numbness  HPI: Kylie Bailey is a 30 year old female with a history of headaches associated with photophobia who presents with left-sided numbness and visual change that started abruptly at 11 AM.  Though she does get headaches, as well as has spots in her vision from time to time, the symptoms today were significantly different from her routine headaches.  She noticed a small spot in her vision that gradually expanded associated with flashing lights.  At the same time, she noticed some numbness in her hands that spread over the course of 5 to 10 minutes towards her face.  She noticed tingling of her face at that time.  She does complain of a right retro-orbital headache as well as photophobia at the current time.  LKW: 3 AM tpa given?: No, outside of window  Exam: Vitals:   05/10/20 1241 05/10/20 1303  BP: 120/60 (!) 150/98  Pulse: 97 97  Resp: 19 20  Temp: 98.3 F (36.8 C)   SpO2: 99% 100%    General: in bed, nad  1A: Level of Consciousness - 0 1B: Ask Month and Age - 0 1C: 'Blink Eyes' & 'Squeeze Hands' - 0 2: Test Horizontal Extraocular Movements - 0 3: Test Visual Fields - 0 4: Test Facial Palsy - 0 5A: Test Left Arm Motor Drift - 0 5B: Test Right Arm Motor Drift - 0 6A: Test Left Leg Motor Drift - 0 6B: Test Right Leg Motor Drift - 0 7: Test Limb Ataxia - 0 8: Test Sensation - 0 9: Test Language/Aphasia- 0 10: Test Dysarthria - 0 11: Test Extinction/Inattention - 0 NIHSS score:  0   Imaging Reviewed: CT head - negative, MRI brain from 2017 when she had similar symptoms - also negative.   Labs reviewed in epic and pertinent values follow: Cr 0.86, otherwise unremarkable CMP    Assessment: 30 year old female with progressive paresthesia and scotoma associated with positive symptoms (tingling and flashing lights) in the setting of unilateral retro-orbital headache with photophobia.  This is very consistent with migraine with aura.  Given her reported history that this is very different than her typical migraine, I think it would be reasonable to visualize her vessels, but unless there was some significant finding on the CTA, I do not think I would favor further work-up.  Recommendations:  1) could consider CT angiogram of the head and neck 2) migraine cocktail    This patient is receiving care for possible acute neurological changes. There was 45 minutes of care by this provider at the time of service, including time for direct evaluation via telemedicine, review of medical records, imaging studies and discussion of findings with providers, the patient and/or family.  Ritta Slot, MD [facility] [phone]  If 7pm- 7am, please page neurology on call as listed in AMION.

## 2020-05-10 NOTE — ED Provider Notes (Signed)
[facility] EMERGENCY DEPARTMENT Provider Note   CSN: [id number] Arrival date & time: 05/10/20  1228  An emergency department physician performed an initial assessment on this suspected stroke patient at 1250.  History Chief Complaint  Patient presents with  . Code Stroke    Kylie Bailey is a 30 y.o. female.  Patient felt fine yesterday.  Patient went to bed about 3 in the morning.  Patient awoke at about 9 this morning with some left eye visual problems.  And some mild discomfort above her right eye forehead area.  Patient has a history of chronic headaches.  She went to urgent care on the way to urgent care her left hand went numb.  And then she got numbness around her mouth and tongue.  Patient states that something similar happened many years ago.  Similar symptoms but not as severe as what she is experiencing today.  She was evaluated for that.  But was unclear on exactly what they thought the cause was.  Based on presentation somewhat suggestive of complicated migraine.  We will go ahead and initiate code stroke since she has been having some developing symptoms since awaking this morning.  No speech problems.  No motor weakness problems.        Past Medical History:  Diagnosis Date  . Anxiety   . Bipolar disorder (HCC)   . Bulimia   . Chronic hypertension affecting pregnancy 06/27/2018  . Chronic hypertension during pregnancy, antepartum 12/23/2017   [X]  Aspirin 81 mg daily after 12 weeks Current antihypertensives:  None   Baseline and surveillance labs (pulled in from [facility], refresh links as needed)  Lab Results Component Value Date  PLT 233 12/14/2017  CREATININE 0.97 08/10/2017  AST 19 06/04/2016  ALT 20 06/04/2016   Antenatal Testing CHTN - O10.919  Group I  BP < 140/90, no preeclampsia, AGA,  nml AFV, +/- meds    Group II BP > 140/90, on m  . Depression   . Elevated BP without diagnosis of hypertension 12/11/2015  . Excessive weight gain during pregnancy in second  trimester 03/16/2018   14# weight gain in 1 month  . GERD (gastroesophageal reflux disease)   . Hypertension   . Migraines   . Panic attacks   . Pregnancy induced hypertension   . Seasonal allergies   . Supervision of other normal pregnancy, antepartum 10/31/2017    Nursing Staff Provider Office Location  [facility] Transferred to [facility] Dating   LMP Language   English  Anatomy US   Flu Vaccine   declined - 12/22/17 Genetic Screen  NIPS:   AFP:   First Screen:  Quad:   TDaP vaccine   04/26/2018 Hgb A1C or  GTT Early 4.8 (12/14/17) Third trimester  Rhogam     LAB RESULTS  Feeding Plan  Breast  Blood Type A/Positive/-- (08/14 1219)  Contraception  Tubal? Antibody     Patient Active Problem List   Diagnosis Date Noted  . COVID-19 04/15/2020  . Anxiety and depression 04/02/2020  . Encounter to establish care 04/02/2020  . Other chest pain 08/31/2018  . Bipolar disorder (HCC) 10/07/2017  . Skin tag 10/07/2017  . Seasonal allergies 08/18/2017  . Essential hypertension 12/03/2016  . Stress 12/11/2015  . Panic attacks 12/11/2015  . Obesity 12/11/2015    Past Surgical History:  Procedure Laterality Date  . TONSILLECTOMY    . TYMPANOSTOMY TUBE PLACEMENT Bilateral      OB History    Gravida  2  Para  2   Term  2   Preterm      AB      Living  2     SAB      IAB      Ectopic      Multiple  0   Live Births  2           Family History  Problem Relation Age of Onset  . Hypertension Mother   . Heart attack Mother   . Drug abuse Father   . High Cholesterol Brother   . Diabetes Paternal Grandmother   . High blood pressure Paternal Grandmother   . Heart disease Paternal Grandmother   . Cancer Paternal Grandmother   . Heart disease Maternal Uncle     Social History   Tobacco Use  . Smoking status: Current Every Day Smoker    Packs/day: 1.00    Years: 5.00    Pack years: 5.00    Types: Cigarettes  . Smokeless tobacco: Never Used  Vaping Use  . Vaping Use:  Never used  Substance Use Topics  . Alcohol use: Yes    Comment: 1 glass of wine 2 nights per week  . Drug use: No    Home Medications Prior to Admission medications   Medication Sig Start Date End Date Taking? Authorizing Provider  acetaminophen (TYLENOL) 500 MG tablet Take 1 tablet (500 mg total) by mouth every 6 (six) hours as needed. 04/15/20   Heather Roberts, NP  albuterol (VENTOLIN HFA) 108 (90 Base) MCG/ACT inhaler Inhale 1-2 puffs into the lungs every 6 (six) hours as needed for wheezing or shortness of breath. 04/02/20   Heather Roberts, NP  calcium carbonate (TUMS - DOSED IN MG ELEMENTAL CALCIUM) 500 MG chewable tablet Chew 2 tablets by mouth 2 (two) times daily as needed for indigestion or heartburn.    [provider]  Chlorpheniramine-DM 4-30 MG TABS Take 1 tablet by mouth every 6 (six) hours as needed (cough and congestion). 04/15/20   Heather Roberts, NP  citalopram (CELEXA) 20 MG tablet Take 1 tablet (20 mg total) by mouth daily. 04/02/20   Heather Roberts, NP  ibuprofen (ADVIL,MOTRIN) 800 MG tablet Take 1 tablet (800 mg total) by mouth 3 (three) times daily. 06/29/18   Tamera Stands, DO  Prenatal MV & Min w/FA-DHA (PRENATAL ADULT GUMMY/DHA/FA) 0.4-25 MG CHEW Chew 1 tablet by mouth daily. 10/31/17   Freddrick March, MD  famotidine (PEPCID) 20 MG tablet Take 1 tablet (20 mg total) by mouth 2 (two) times daily. 09/02/18 03/08/19  Arby Barrette, MD  hydrochlorothiazide (MICROZIDE) 12.5 MG capsule Take 1 capsule (12.5 mg total) by mouth daily. 08/31/18 03/08/19  Diallo, Lilia Argue, MD  loratadine (CLARITIN) 10 MG tablet Take 1 tablet (10 mg total) by mouth daily. 06/21/18 03/08/19  [name], [place], DO  verapamil (CALAN-SR) 120 MG CR tablet Take 1 tablet (120 mg total) by mouth daily. 06/29/18 03/08/19  Tamera Stands, DO    Allergies    Augmentin [amoxicillin-pot clavulanate]  Review of Systems   Review of Systems  Constitutional: Negative for chills and fever.  HENT:  Negative for congestion, rhinorrhea and sore throat.   Eyes: Positive for visual disturbance.  Respiratory: Negative for cough and shortness of breath.   Cardiovascular: Negative for chest pain and leg swelling.  Gastrointestinal: Negative for abdominal pain, diarrhea, nausea and vomiting.  Genitourinary: Negative for dysuria.  Musculoskeletal: Negative for back pain and neck pain.  Skin: Negative for rash.  Neurological: Positive for numbness and headaches. Negative for dizziness, tremors, seizures, syncope, facial asymmetry, speech difficulty, weakness and light-headedness.  Hematological: Does not bruise/bleed easily.  Psychiatric/Behavioral: Negative for confusion.    Physical Exam Updated Vital Signs BP 110/65   Pulse 60   Temp 98.3 F (36.8 C) (Oral)   Resp (!) 23   Ht 1.753 m (5\' 9" )   Wt 136.1 kg   LMP 04/29/2020   SpO2 100%   BMI 44.30 kg/m   Physical Exam Vitals and nursing note reviewed.  Constitutional:      General: She is not in acute distress.    Appearance: Normal appearance. She is well-developed and well-nourished.  HENT:     Head: Normocephalic and atraumatic.  Eyes:     Extraocular Movements: Extraocular movements intact.     Conjunctiva/sclera: Conjunctivae normal.     Pupils: Pupils are equal, round, and reactive to light.  Cardiovascular:     Rate and Rhythm: Normal rate and regular rhythm.     Heart sounds: No murmur heard.   Pulmonary:     Effort: Pulmonary effort is normal. No respiratory distress.     Breath sounds: Normal breath sounds.  Abdominal:     Palpations: Abdomen is soft.     Tenderness: There is no abdominal tenderness.  Musculoskeletal:        General: No swelling or edema. Normal range of motion.     Cervical back: Normal range of motion and neck supple.  Skin:    General: Skin is warm and dry.     Capillary Refill: Capillary refill takes less than 2 seconds.  Neurological:     Mental Status: She is alert and oriented to  person, place, and time.     Cranial Nerves: Cranial nerve deficit present.     Sensory: No sensory deficit.     Motor: No weakness.     Comments: Patient with some decreased vision in the left eye.  Also with some numbness to the left hand.  And some perioral numbness.  Psychiatric:        Mood and Affect: Mood and affect normal.     ED Results / Procedures / Treatments   Labs (all labs ordered are listed, but only abnormal results are displayed) Labs Reviewed  CBC - Abnormal; Notable for the following components:      Result Value   Hemoglobin 16.2 (*)    HCT 47.1 (*)    All other components within normal limits  COMPREHENSIVE METABOLIC PANEL - Abnormal; Notable for the following components:   Glucose, Bld 110 (*)    Calcium 8.8 (*)    All other components within normal limits  I-STAT CHEM 8, ED - Abnormal; Notable for the following components:   Glucose, Bld 105 (*)    Hemoglobin 16.3 (*)    HCT 48.0 (*)    All other components within normal limits  CBG MONITORING, ED - Abnormal; Notable for the following components:   Glucose-Capillary 106 (*)    All other components within normal limits  SARS CORONAVIRUS 2 BY RT PCR (HOSPITAL ORDER, PERFORMED IN [facility])  ETHANOL  PROTIME-INR  APTT  DIFFERENTIAL    EKG EKG Interpretation  Date/Time:  Saturday May 10 2020 13:03:09 EST Ventricular Rate:  89 PR Interval:    QRS Duration: 105 QT Interval:  380 QTC Calculation: 463 R Axis:   73 Text Interpretation: Sinus rhythm Baseline wander in lead(s)  V3 V5 V6 Confirmed by Vanetta MuldersZackowski, Liylah Najarro [phone]([id number]) on 05/10/2020 1:44:24 PM   Radiology CT Code Stroke CTA Head W/WO contrast  Result Date: 05/10/2020 CLINICAL DATA:  Focal neuro deficit. Left arm numbness and left-sided facial numbness. EXAM: CT ANGIOGRAPHY HEAD AND NECK TECHNIQUE: Multidetector CT imaging of the head and neck was performed using the standard protocol during bolus administration of intravenous  contrast. Multiplanar CT image reconstructions and MIPs were obtained to evaluate the vascular anatomy. Carotid stenosis measurements (when applicable) are obtained utilizing NASCET criteria, using the distal internal carotid diameter as the denominator. CONTRAST:  75mL OMNIPAQUE IOHEXOL 350 MG/ML SOLN COMPARISON:  CT code stroke from the same day. FINDINGS: CTA NECK FINDINGS Aortic arch: Great vessel origins are patent. Right carotid system: No evidence of dissection, stenosis (50% or greater) or occlusion. Left carotid system: No evidence of dissection, stenosis (50% or greater) or occlusion. Vertebral arteries: Left dominant. No evidence of dissection, stenosis (50% or greater) or occlusion. Skeleton: No acute findings. Other neck: No mass or suspicious adenopathy. Upper chest: Imaged lung apices are clear. Review of the MIP images confirms the above findings CTA HEAD FINDINGS Evaluation is limited by venous contamination. Anterior circulation: No large vessel occlusion or proximal hemodynamically significant stenosis, aneurysm, or vascular malformation. Evaluation of the more distal vasculature is limited by venous contamination. Posterior circulation: No large vessel occlusion or proximal hemodynamically significant stenosis, aneurysm, or vascular malformation. Diminutive vertebrobasilar system with bilateral fetal type PCAs. Venous sinuses: As permitted by contrast timing, patent. Review of the MIP images confirms the above findings IMPRESSION: 1. No evidence of emergent large vessel occlusion or hemodynamically significant proximal stenosis in the head or neck. 2. Diminutive vertebrobasilar system with bilateral fetal type PCAs, anatomic variant Electronically Signed   By: Feliberto HartsFrederick S Jones MD   On: 05/10/2020 14:27   CT Code Stroke CTA Neck W/WO contrast  Result Date: 05/10/2020 CLINICAL DATA:  Focal neuro deficit. Left arm numbness and left-sided facial numbness. EXAM: CT ANGIOGRAPHY HEAD AND NECK  TECHNIQUE: Multidetector CT imaging of the head and neck was performed using the standard protocol during bolus administration of intravenous contrast. Multiplanar CT image reconstructions and MIPs were obtained to evaluate the vascular anatomy. Carotid stenosis measurements (when applicable) are obtained utilizing NASCET criteria, using the distal internal carotid diameter as the denominator. CONTRAST:  75mL OMNIPAQUE IOHEXOL 350 MG/ML SOLN COMPARISON:  CT code stroke from the same day. FINDINGS: CTA NECK FINDINGS Aortic arch: Great vessel origins are patent. Right carotid system: No evidence of dissection, stenosis (50% or greater) or occlusion. Left carotid system: No evidence of dissection, stenosis (50% or greater) or occlusion. Vertebral arteries: Left dominant. No evidence of dissection, stenosis (50% or greater) or occlusion. Skeleton: No acute findings. Other neck: No mass or suspicious adenopathy. Upper chest: Imaged lung apices are clear. Review of the MIP images confirms the above findings CTA HEAD FINDINGS Evaluation is limited by venous contamination. Anterior circulation: No large vessel occlusion or proximal hemodynamically significant stenosis, aneurysm, or vascular malformation. Evaluation of the more distal vasculature is limited by venous contamination. Posterior circulation: No large vessel occlusion or proximal hemodynamically significant stenosis, aneurysm, or vascular malformation. Diminutive vertebrobasilar system with bilateral fetal type PCAs. Venous sinuses: As permitted by contrast timing, patent. Review of the MIP images confirms the above findings IMPRESSION: 1. No evidence of emergent large vessel occlusion or hemodynamically significant proximal stenosis in the head or neck. 2. Diminutive vertebrobasilar system with bilateral fetal type PCAs, anatomic variant Electronically  Signed   By: Feliberto Harts MD   On: 05/10/2020 14:27   CT HEAD CODE STROKE WO CONTRAST  Result Date:  05/10/2020 CLINICAL DATA:  Code stroke. Acute onset of left upper extremity and facial numbness. Left visual field blurry. EXAM: CT HEAD WITHOUT CONTRAST TECHNIQUE: Contiguous axial images were obtained from the base of the skull through the vertex without intravenous contrast. COMPARISON:  MR head without and with contrast 09/02/2015 FINDINGS: Brain: No acute infarct, hemorrhage, or mass lesion is present. Basal ganglia are intact. Insular ribbon is normal. No acute or focal cortical abnormalities are present. The ventricles are of normal size. No significant extraaxial fluid collection is present. The brainstem and cerebellum are within normal limits. Vascular: No hyperdense vessel or unexpected calcification. Skull: Mild diffuse calvarial thickening is present. No focal lytic or blastic lesion is present. No fracture is present. No significant extracranial soft tissue lesion is present. Sinuses/Orbits: The paranasal sinuses and mastoid air cells are clear. The globes and orbits are within normal limits. ASPECTS [facility] Stroke Program Early CT Score) - Ganglionic level infarction (caudate, lentiform nuclei, internal capsule, insula, M1-M3 cortex): 7/7 - Supraganglionic infarction (M4-M6 cortex): 3/3 Total score (0-10 with 10 being normal): 10/10 IMPRESSION: 1. Negative CT of the head. 2. ASPECTS is 10/10. These results were called by telephone at the time of interpretation on 05/10/2020 at 1:04 pm to provider Zettie Pho, PA, who verbally acknowledged these results. Electronically Signed   By: Marin Roberts M.D.   On: 05/10/2020 13:08    Procedures Procedures   CRITICAL CARE Performed by: Vanetta Mulders Total critical care time: 45 minutes Critical care time was exclusive of separately billable procedures and treating other patients. Critical care was necessary to treat or prevent imminent or life-threatening deterioration. Critical care was time spent personally by me on the following  activities: development of treatment plan with patient and/or surrogate as well as nursing, discussions with consultants, evaluation of patient's response to treatment, examination of patient, obtaining history from patient or surrogate, ordering and performing treatments and interventions, ordering and review of laboratory studies, ordering and review of radiographic studies, pulse oximetry and re-evaluation of patient's condition.   Medications Ordered in ED Medications  sodium chloride 0.9 % bolus 1,000 mL (0 mLs Intravenous Stopped 05/10/20 1440)  dexamethasone (DECADRON) injection 10 mg (10 mg Intravenous Given 05/10/20 1337)  metoCLOPramide (REGLAN) injection 10 mg (10 mg Intravenous Given 05/10/20 1339)  diphenhydrAMINE (BENADRYL) injection 25 mg (25 mg Intravenous Given 05/10/20 1336)  iohexol (OMNIPAQUE) 350 MG/ML injection 75 mL (75 mLs Intravenous Contrast Given 05/10/20 1352)    ED Course  I have reviewed the triage vital signs and the nursing notes.  Pertinent labs & imaging results that were available during my care of the patient were reviewed by me and considered in my medical decision making (see chart for details).    MDM Rules/Calculators/A&P                          Code stroke initiated.  But historically this sounds like it is probably a complicated migraine.  Because she is got headache pain similar to her past chronic headaches.  And had something similar occur several years ago.  Seen by Dr.Kirkpatrick for teleneurology.  He concluded the same.  But patient very concerned.  So we have ordered CT angio head and neck.  Regulars head CT was negative.  We will give her a migraine cocktail with  Reglan Decadron and Benadryl and some IV fluids.  CT angio head and neck without any acute findings.  Migraine cocktail resolved almost all of her symptoms except for a little bit of the vision still being off but it is almost completely back to normal.  The numbness has resolved.  Neurology  did not feel that MRI was necessary and recommended the CT angio.  Patient will be discharged home with follow-up with neurology.  Patient reassured that this sounds very much like complicated migraine.     Final Clinical Impression(s) / ED Diagnoses Final diagnoses:  Complicated migraine    Rx / DC Orders ED Discharge Orders    None       Vanetta Mulders, MD 05/10/20 1457

## 2020-05-10 NOTE — Progress Notes (Signed)
Code Stroke Time Documentation   1248 Call Time 1247 Beeper Time 1257 Exam Started  1301 Exam Finished 1301 Images sent to [facility] 1301 Exam completed in Epic  1300 [facility] radiology called

## 2020-05-10 NOTE — ED Notes (Signed)
CT reports normal head CT.

## 2020-05-10 NOTE — ED Notes (Signed)
Patient returned to room from CT at this time. Neurologist on screen at this time assessing patient.

## 2020-05-10 NOTE — ED Notes (Addendum)
Neurologist believes that patient has complex migraine. Code stroke cancelled at this time. Patient will not receive TPA.

## 2020-05-10 NOTE — ED Triage Notes (Signed)
Pt presents to ED with complaints of left arm numbness, left sided facial numbess, and tongue numb, left sides blurry since waking up this am at 1100. LKW 0300. Pt states she also has a headache on the right side of her head.

## 2020-05-11 ENCOUNTER — Telehealth (HOSPITAL_COMMUNITY): Payer: Self-pay | Admitting: Family

## 2020-05-11 ENCOUNTER — Telehealth: Payer: Self-pay | Admitting: Physician Assistant

## 2020-05-11 NOTE — Telephone Encounter (Signed)
Called to discuss with patient about COVID-19 symptoms and the use of one of the available treatments for those with mild to moderate Covid symptoms and at a high risk of hospitalization.  Pt appears to qualify for outpatient treatment due to co-morbid conditions and/or a member of an at-risk group in accordance with the FDA Emergency Use Authorization.   Unable to reach pt. Left Voice mail to call back.   [employer]

## 2020-05-11 NOTE — Telephone Encounter (Signed)
Called to discuss with patient about COVID-19 symptoms and the use of one of the available treatments for those with mild to moderate Covid symptoms and at a high risk of hospitalization.  Pt appears to qualify for outpatient treatment due to co-morbid conditions and/or a member of an at-risk group in accordance with the FDA Emergency Use Authorization.     Unable to reach pt - Vm left  [employer]

## 2020-05-12 ENCOUNTER — Telehealth: Payer: Self-pay | Admitting: Physician Assistant

## 2020-05-12 NOTE — Telephone Encounter (Signed)
Called to discuss with [name] about Covid symptoms and the use of sotrovimab, remdisivir or oral therapies for those with mild to moderate Covid symptoms and at a high risk of hospitalization.    Pt does not qualify as pt's symptoms first presented > 10 days prior to timing of infusion. Symptoms tier reviewed as well as criteria for ending isolation. Preventative practices reviewed. Patient verbalized understanding. Pt had covid last month and testing persistently positive. No new symptoms.    Patient Active Problem List   Diagnosis Date Noted  . COVID-19 04/15/2020  . Anxiety and depression 04/02/2020  . Encounter to establish care 04/02/2020  . Other chest pain 08/31/2018  . Bipolar disorder (HCC) 10/07/2017  . Skin tag 10/07/2017  . Seasonal allergies 08/18/2017  . Essential hypertension 12/03/2016  . Stress 12/11/2015  . Panic attacks 12/11/2015  . Obesity 12/11/2015    Cline Crock PA-C

## 2020-05-13 DIAGNOSIS — I10 Essential (primary) hypertension: Secondary | ICD-10-CM | POA: Diagnosis not present

## 2020-05-13 DIAGNOSIS — Z7689 Persons encountering health services in other specified circumstances: Secondary | ICD-10-CM | POA: Diagnosis not present

## 2020-05-13 DIAGNOSIS — Z139 Encounter for screening, unspecified: Secondary | ICD-10-CM | POA: Diagnosis not present

## 2020-05-14 LAB — CMP14+EGFR
ALT: 32 IU/L (ref 0–32)
AST: 20 IU/L (ref 0–40)
Albumin/Globulin Ratio: 2 (ref 1.2–2.2)
Albumin: 4.2 g/dL (ref 3.9–5.0)
Alkaline Phosphatase: 66 IU/L (ref 44–121)
BUN/Creatinine Ratio: 14 (ref 9–23)
BUN: 12 mg/dL (ref 6–20)
Bilirubin Total: 0.4 mg/dL (ref 0.0–1.2)
CO2: 21 mmol/L (ref 20–29)
Calcium: 8.9 mg/dL (ref 8.7–10.2)
Chloride: 105 mmol/L (ref 96–106)
Creatinine, Ser: 0.88 mg/dL (ref 0.57–1.00)
GFR calc Af Amer: 104 mL/min/{1.73_m2} (ref >59)
GFR calc non Af Amer: 90 mL/min/{1.73_m2} (ref >59)
Globulin, Total: 2.1 g/dL (ref 1.5–4.5)
Glucose: 83 mg/dL (ref 65–99)
Potassium: 4.2 mmol/L (ref 3.5–5.2)
Sodium: 140 mmol/L (ref 134–144)
Total Protein: 6.3 g/dL (ref 6.0–8.5)

## 2020-05-14 LAB — CBC WITH DIFFERENTIAL/PLATELET
Basophils Absolute: 0 10*3/uL (ref 0.0–0.2)
Basos: 1 %
EOS (ABSOLUTE): 0.1 10*3/uL (ref 0.0–0.4)
Eos: 1 %
Hematocrit: 45.2 % (ref 34.0–46.6)
Hemoglobin: 15.2 g/dL (ref 11.1–15.9)
Immature Grans (Abs): 0 10*3/uL (ref 0.0–0.1)
Immature Granulocytes: 0 %
Lymphocytes Absolute: 2.3 10*3/uL (ref 0.7–3.1)
Lymphs: 27 %
MCH: 31 pg (ref 26.6–33.0)
MCHC: 33.6 g/dL (ref 31.5–35.7)
MCV: 92 fL (ref 79–97)
Monocytes Absolute: 0.6 10*3/uL (ref 0.1–0.9)
Monocytes: 7 %
Neutrophils Absolute: 5.4 10*3/uL (ref 1.4–7.0)
Neutrophils: 64 %
Platelets: 259 10*3/uL (ref 150–450)
RBC: 4.91 x10E6/uL (ref 3.77–5.28)
RDW: 12.5 % (ref 11.7–15.4)
WBC: 8.4 10*3/uL (ref 3.4–10.8)

## 2020-05-14 LAB — TSH+FREE T4
Free T4: 1.29 ng/dL (ref 0.82–1.77)
TSH: 2.7 u[IU]/mL (ref 0.450–4.500)

## 2020-05-14 LAB — LIPID PANEL WITH LDL/HDL RATIO
Cholesterol, Total: 213 mg/dL — ABNORMAL HIGH (ref 100–199)
HDL: 33 mg/dL — ABNORMAL LOW (ref >39)
LDL Chol Calc (NIH): 156 mg/dL — ABNORMAL HIGH (ref 0–99)
LDL/HDL Ratio: 4.7 ratio — ABNORMAL HIGH (ref 0.0–3.2)
Triglycerides: 130 mg/dL (ref 0–149)
VLDL Cholesterol Cal: 24 mg/dL (ref 5–40)

## 2020-05-14 LAB — HCV INTERPRETATION

## 2020-05-14 LAB — HCV AB W/RFLX TO VERIFICATION: HCV Ab: <0.1 s/co ratio (ref 0.0–0.9)

## 2020-05-14 NOTE — Progress Notes (Signed)
Cholesterol was elevated, but we will discuss that at our next appointment on the 14th. Otherwise, labs look great.

## 2020-05-15 ENCOUNTER — Ambulatory Visit: Payer: Medicaid Other | Admitting: Nurse Practitioner

## 2020-05-19 ENCOUNTER — Ambulatory Visit (INDEPENDENT_AMBULATORY_CARE_PROVIDER_SITE_OTHER): Payer: Medicaid Other | Admitting: Nurse Practitioner

## 2020-05-19 ENCOUNTER — Other Ambulatory Visit: Payer: Self-pay

## 2020-05-19 ENCOUNTER — Encounter: Payer: Self-pay | Admitting: Nurse Practitioner

## 2020-05-19 ENCOUNTER — Ambulatory Visit: Payer: Medicaid Other | Admitting: Nurse Practitioner

## 2020-05-19 DIAGNOSIS — F32A Depression, unspecified: Secondary | ICD-10-CM | POA: Diagnosis not present

## 2020-05-19 DIAGNOSIS — G43009 Migraine without aura, not intractable, without status migrainosus: Secondary | ICD-10-CM

## 2020-05-19 DIAGNOSIS — I10 Essential (primary) hypertension: Secondary | ICD-10-CM

## 2020-05-19 DIAGNOSIS — F419 Anxiety disorder, unspecified: Secondary | ICD-10-CM

## 2020-05-19 MED ORDER — PROPRANOLOL HCL ER 80 MG PO CP24: 80.0000 mg | ORAL_CAPSULE | Freq: Every day | ORAL | 1 refills | Status: DC

## 2020-05-19 NOTE — Assessment & Plan Note (Signed)
-  she states she has had good luck lately and did not pick up the celexa -daughter is getting chemotherapy for cancer, and her last day is at the end of February -she states that her depression comes in spurts, but she is not experiencing that today

## 2020-05-19 NOTE — Progress Notes (Signed)
Established Patient Office Visit  Subjective:  Patient ID: Kylie Bailey, female    DOB: 1990-10-08  Age: 30 y.o. MRN: [id number]  CC:  Chief Complaint  Patient presents with  . Annual Exam    HPI Kylie Bailey presents for physical exam. She states she forgot to pick up celexa, and she has been feeling better.  Past Medical History:  Diagnosis Date  . Anxiety   . Bipolar disorder (HCC)   . Bulimia   . Chronic hypertension affecting pregnancy 06/27/2018  . Chronic hypertension during pregnancy, antepartum 12/23/2017   [X]  Aspirin 81 mg daily after 12 weeks Current antihypertensives:  None   Baseline and surveillance labs (pulled in from [facility], refresh links as needed)  Lab Results Component Value Date  PLT 233 12/14/2017  CREATININE 0.97 08/10/2017  AST 19 06/04/2016  ALT 20 06/04/2016   Antenatal Testing CHTN - O10.919  Group I  BP < 140/90, no preeclampsia, AGA,  nml AFV, +/- meds    Group II BP > 140/90, on m  . Depression   . Elevated BP without diagnosis of hypertension 12/11/2015  . Excessive weight gain during pregnancy in second trimester 03/16/2018   14# weight gain in 1 month  . GERD (gastroesophageal reflux disease)   . Hypertension   . Migraines   . Panic attacks   . Pregnancy induced hypertension   . Seasonal allergies   . Supervision of other normal pregnancy, antepartum 10/31/2017    Nursing Staff Provider Office Location  [facility] Transferred to [facility]ating   LMP Language   English  Anatomy [facility]   Flu Vaccine   declined - 12/22/17 Genetic Screen  NIPS:   AFP:   First Screen:  Quad:   TDaP vaccine   04/26/2018 Hgb A1C or  GTT Early 4.8 (12/14/17) Third trimester  Rhogam     LAB RESULTS  Feeding Plan  Breast  Blood Type A/Positive/-- (08/14 1219)  Contraception  Tubal? Antibody     Past Surgical History:  Procedure Laterality Date  . TONSILLECTOMY    . TYMPANOSTOMY TUBE PLACEMENT Bilateral     Family History  Problem Relation Age of Onset  .  Hypertension Mother   . Heart attack Mother   . Drug abuse Father   . High Cholesterol Brother   . Diabetes Paternal Grandmother   . High blood pressure Paternal Grandmother   . Heart disease Paternal Grandmother   . Cancer Paternal Grandmother   . Heart disease Maternal Uncle     Social History   Socioeconomic History  . Marital status: Married    Spouse name: Travanti  . Number of children: 1  . Years of education: 1220  . Highest education level: 12th grade  Occupational History  . Occupation: Stay Home Mom  Tobacco Use  . Smoking status: Current Every Day Smoker    Packs/day: 1.00    Years: 5.00    Pack years: 5.00    Types: Cigarettes  . Smokeless tobacco: Never Used  Vaping Use  . Vaping Use: Never used  Substance and Sexual Activity  . Alcohol use: Yes    Comment: 1 glass of wine 2 nights per week  . Drug use: No  . Sexual activity: Yes    Birth control/protection: None  Other Topics Concern  . Not on file  Social History Narrative   Lack of transportation   Social Determinants of Health   Financial Resource Strain: Not on file  Food  Insecurity: Not on file  Transportation Needs: Not on file  Physical Activity: Not on file  Stress: Not on file  Social Connections: Not on file  Intimate Partner Violence: Not on file    Outpatient Medications Prior to Visit  Medication Sig Dispense Refill  . acetaminophen (TYLENOL) 500 MG tablet Take 1 tablet (500 mg total) by mouth every 6 (six) hours as needed. 30 tablet 0  . albuterol (VENTOLIN HFA) 108 (90 Base) MCG/ACT inhaler Inhale 1-2 puffs into the lungs every 6 (six) hours as needed for wheezing or shortness of breath. 1 each 0  . calcium carbonate (TUMS - DOSED IN MG ELEMENTAL CALCIUM) 500 MG chewable tablet Chew 2 tablets by mouth 2 (two) times daily as needed for indigestion or heartburn.    Marland Kitchen ibuprofen (ADVIL,MOTRIN) 800 MG tablet Take 1 tablet (800 mg total) by mouth 3 (three) times daily. 30 tablet  0  . Prenatal MV & Min w/FA-DHA (PRENATAL ADULT GUMMY/DHA/FA) 0.4-25 MG CHEW Chew 1 tablet by mouth daily. 90 tablet 3  . Chlorpheniramine-DM 4-30 MG TABS Take 1 tablet by mouth every 6 (six) hours as needed (cough and congestion). 56 tablet 0  . citalopram (CELEXA) 20 MG tablet Take 1 tablet (20 mg total) by mouth daily. 30 tablet 3   No facility-administered medications prior to visit.    Allergies  Allergen Reactions  . Augmentin [Amoxicillin-Pot Clavulanate] Rash  . Amoxicillin Hives, Itching and Rash    ROS Review of Systems  Constitutional: Negative.   HENT: Negative.   Eyes: Negative.   Respiratory: Negative.   Cardiovascular: Negative.   Gastrointestinal: Negative.   Endocrine: Negative.   Genitourinary: Negative.   Musculoskeletal: Negative.   Skin: Negative.   Allergic/Immunologic: Negative.   Neurological: Negative.   Hematological: Negative.   Psychiatric/Behavioral: Negative.       Objective:    Physical Exam Constitutional:      Appearance: She is obese.  HENT:     Head: Normocephalic and atraumatic.     Right Ear: Tympanic membrane, ear canal and external ear normal.     Left Ear: Tympanic membrane, ear canal and external ear normal.     Ears:     Comments: Scarring to TMs, had previous ear tubes    Nose: Nose normal.     Mouth/Throat:     Mouth: Mucous membranes are moist.     Pharynx: Oropharynx is clear.  Eyes:     Extraocular Movements: Extraocular movements intact.     Conjunctiva/sclera: Conjunctivae normal.     Pupils: Pupils are equal, round, and reactive to light.  Cardiovascular:     Rate and Rhythm: Normal rate and regular rhythm.     Pulses: Normal pulses.     Heart sounds: Normal heart sounds.  Pulmonary:     Effort: Pulmonary effort is normal.     Breath sounds: Normal breath sounds.  Abdominal:     General: Bowel sounds are normal.     Palpations: Abdomen is soft.  Musculoskeletal:        General: Normal range of motion.      Cervical back: Normal range of motion and neck supple.  Skin:    General: Skin is warm and dry.     Capillary Refill: Capillary refill takes less than 2 seconds.  Neurological:     General: No focal deficit present.     Mental Status: She is alert and oriented to person, place, and time.  Psychiatric:  Mood and Affect: Mood normal.        Behavior: Behavior normal.        Thought Content: Thought content normal.        Judgment: Judgment normal.     BP (!) 149/85   Pulse 85   Temp 98.9 F (37.2 C)   Resp 18   Ht 5\' 9"  (1.753 m)   Wt (!) 339 lb (153.8 kg)   LMP 04/29/2020   SpO2 98%   BMI 50.06 kg/m  Wt Readings from Last 3 Encounters:  05/19/20 (!) 339 lb (153.8 kg)  05/10/20 300 lb (136.1 kg)  04/12/20 (!) 340 lb (154.2 kg)     There are no preventive care reminders to display for this patient.  There are no preventive care reminders to display for this patient.  Lab Results  Component Value Date   TSH 2.700 05/13/2020   Lab Results  Component Value Date   WBC 8.4 05/13/2020   HGB 15.2 05/13/2020   HCT 45.2 05/13/2020   MCV 92 05/13/2020   PLT 259 05/13/2020   Lab Results  Component Value Date   NA 140 05/13/2020   K 4.2 05/13/2020   CO2 21 05/13/2020   GLUCOSE 83 05/13/2020   BUN 12 05/13/2020   CREATININE 0.88 05/13/2020   BILITOT 0.4 05/13/2020   ALKPHOS 66 05/13/2020   AST 20 05/13/2020   ALT 32 05/13/2020   PROT 6.3 05/13/2020   ALBUMIN 4.2 05/13/2020   CALCIUM 8.9 05/13/2020   ANIONGAP 6 05/10/2020   Lab Results  Component Value Date   CHOL 213 (H) 05/13/2020   Lab Results  Component Value Date   HDL 33 (L) 05/13/2020   Lab Results  Component Value Date   LDLCALC 156 (H) 05/13/2020   Lab Results  Component Value Date   TRIG 130 05/13/2020   Lab Results  Component Value Date   CHOLHDL 4.5 (H) 08/31/2018   Lab Results  Component Value Date   HGBA1C 4.8 12/14/2017      Assessment & Plan:   Problem List Items  Addressed This Visit      Cardiovascular and Mediastinum   Migraine    -she had a migraine that sent her to the ED on 05/10/20 -she states she has daily headaches -Rx. Propranolol; may increase dose to 160 mg/day at next appointment if she is still having migraine      Relevant Medications   propranolol ER (INDERAL LA) 80 MG 24 hr capsule   Essential hypertension    -BP 149/85 today -Rx. Propranolol as she has migraine as well      Relevant Medications   propranolol ER (INDERAL LA) 80 MG 24 hr capsule     Other   Anxiety and depression    -she states she has had good luck lately and did not pick up the celexa -daughter is getting chemotherapy for cancer, and her last day is at the end of February -she states that her depression comes in spurts, but she is not experiencing that today         Meds ordered this encounter  Medications  . propranolol ER (INDERAL LA) 80 MG 24 hr capsule    Sig: Take 1 capsule (80 mg total) by mouth daily.    Dispense:  90 capsule    Refill:  1    Follow-up: Return in about 3 months (around 08/16/2020) for Med check (propranolol) and weight check.    08/18/2020,  NP

## 2020-05-19 NOTE — Assessment & Plan Note (Signed)
-  BP 149/85 today -Rx. Propranolol as she has migraine as well

## 2020-05-19 NOTE — Assessment & Plan Note (Signed)
-  she had a migraine that sent her to the ED on 05/10/20 -she states she has daily headaches -Rx. Propranolol; may increase dose to 160 mg/day at next appointment if she is still having migraine

## 2020-06-23 ENCOUNTER — Other Ambulatory Visit: Payer: Self-pay

## 2020-06-23 ENCOUNTER — Inpatient Hospital Stay (HOSPITAL_COMMUNITY): Payer: Medicaid Other

## 2020-06-23 ENCOUNTER — Encounter: Payer: Self-pay | Admitting: Internal Medicine

## 2020-06-23 ENCOUNTER — Ambulatory Visit (INDEPENDENT_AMBULATORY_CARE_PROVIDER_SITE_OTHER): Payer: Medicaid Other | Admitting: Internal Medicine

## 2020-06-23 ENCOUNTER — Encounter (HOSPITAL_COMMUNITY): Payer: Self-pay | Admitting: Family Medicine

## 2020-06-23 ENCOUNTER — Inpatient Hospital Stay (HOSPITAL_COMMUNITY)
Admission: AD | Admit: 2020-06-23 | Discharge: 2020-06-23 | Disposition: A | Discharge status: Home / Self Care | Payer: Medicaid Other | Attending: Family Medicine | Admitting: Family Medicine

## 2020-06-23 VITALS — BP 155/86 | HR 98 | Temp 98.4°F | Resp 18 | Ht 70.0 in | Wt 332.4 lb

## 2020-06-23 DIAGNOSIS — R102 Pelvic and perineal pain: Secondary | ICD-10-CM | POA: Insufficient documentation

## 2020-06-23 DIAGNOSIS — R109 Unspecified abdominal pain: Secondary | ICD-10-CM | POA: Diagnosis not present

## 2020-06-23 DIAGNOSIS — O99331 Smoking (tobacco) complicating pregnancy, first trimester: Secondary | ICD-10-CM | POA: Diagnosis not present

## 2020-06-23 DIAGNOSIS — Z3A01 Less than 8 weeks gestation of pregnancy: Secondary | ICD-10-CM

## 2020-06-23 DIAGNOSIS — I10 Essential (primary) hypertension: Secondary | ICD-10-CM | POA: Diagnosis not present

## 2020-06-23 DIAGNOSIS — F17210 Nicotine dependence, cigarettes, uncomplicated: Secondary | ICD-10-CM | POA: Insufficient documentation

## 2020-06-23 DIAGNOSIS — R1032 Left lower quadrant pain: Secondary | ICD-10-CM | POA: Diagnosis not present

## 2020-06-23 DIAGNOSIS — Z3201 Encounter for pregnancy test, result positive: Secondary | ICD-10-CM | POA: Insufficient documentation

## 2020-06-23 DIAGNOSIS — O26891 Other specified pregnancy related conditions, first trimester: Secondary | ICD-10-CM

## 2020-06-23 DIAGNOSIS — O3680X0 Pregnancy with inconclusive fetal viability, not applicable or unspecified: Secondary | ICD-10-CM | POA: Diagnosis not present

## 2020-06-23 LAB — URINALYSIS, ROUTINE W REFLEX MICROSCOPIC
Bilirubin Urine: NEGATIVE
Glucose, UA: NEGATIVE mg/dL
Hgb urine dipstick: NEGATIVE
Ketones, ur: NEGATIVE mg/dL
Leukocytes,Ua: NEGATIVE
Nitrite: NEGATIVE
Protein, ur: NEGATIVE mg/dL
Specific Gravity, Urine: >1.03 — ABNORMAL HIGH (ref 1.005–1.030)
pH: 5.5 (ref 5.0–8.0)

## 2020-06-23 LAB — CBC
HCT: 42.9 % (ref 36.0–46.0)
Hemoglobin: 15 g/dL (ref 12.0–15.0)
MCH: 31.6 pg (ref 26.0–34.0)
MCHC: 35 g/dL (ref 30.0–36.0)
MCV: 90.5 fL (ref 80.0–100.0)
Platelets: 259 10*3/uL (ref 150–400)
RBC: 4.74 MIL/uL (ref 3.87–5.11)
RDW: 12.5 % (ref 11.5–15.5)
WBC: 7.7 10*3/uL (ref 4.0–10.5)
nRBC: 0 % (ref 0.0–0.2)

## 2020-06-23 LAB — POCT URINE PREGNANCY: Preg Test, Ur: POSITIVE — AB

## 2020-06-23 LAB — HCG, QUANTITATIVE, PREGNANCY: hCG, Beta Chain, Quant, S: 377 m[IU]/mL — ABNORMAL HIGH (ref <5)

## 2020-06-23 MED ORDER — LABETALOL HCL 100 MG PO TABS: 100.0000 mg | ORAL_TABLET | Freq: Two times a day (BID) | ORAL | 0 refills | Status: DC

## 2020-06-23 NOTE — Patient Instructions (Signed)
Please stop taking Propranolol, Verapamil and HCTZ. Start taking Labetalol instead.  Please do not take Ibuprofen. Okay to take Tylenol for pain.  You are being referred to OB/GYN for further management.

## 2020-06-23 NOTE — Discharge Instructions (Signed)
Return to care  If you have heavier bleeding that soaks through more that 2 pads per hour for an hour or more If you bleed so much that you feel like you might pass out or you do pass out If you have significant abdominal pain that is not improved with Tylenol   

## 2020-06-23 NOTE — MAU Note (Signed)
Presents with c/o abdominal cramping.  States cramping mostly on left side.  Denies VB.  LMP 05/15/2020

## 2020-06-23 NOTE — Progress Notes (Signed)
Acute Office Visit  Subjective:    Patient ID: Kylie Bailey, female    DOB: Jul 14, 1990, 30 y.o.   MRN: [id number]  Chief Complaint  Patient presents with  . Vaginal Pain    Having pain in vagina is sharp at times also has not had period since 05-15-20     HPI Patient is in today for evaluation of missed period and having vaginal area pain. Her LMP was 02/10 and denies any vaginal bleeding, discharge or spotting since then. Her urine pregnancy test was positive in the office today.  She has not been taking her BP medications and Propranolol for the last 2 weeks. She denies any dizziness, chest pain, dyspnea or palpitations. She smokes sometimes, but states that she will not smoke or drink now as she is pregnant now. She prefers to follow up with an OB/GYN in [place].  Past Medical History:  Diagnosis Date  . Anxiety   . Bipolar disorder (HCC)   . Bulimia   . Depression   . GERD (gastroesophageal reflux disease)   . Hypertension   . Migraines   . Panic attacks   . Seasonal allergies     Past Surgical History:  Procedure Laterality Date  . TONSILLECTOMY    . TYMPANOSTOMY TUBE PLACEMENT Bilateral     Family History  Problem Relation Age of Onset  . Hypertension Mother   . Heart attack Mother   . Drug abuse Father   . High Cholesterol Brother   . Diabetes Paternal Grandmother   . High blood pressure Paternal Grandmother   . Heart disease Paternal Grandmother   . Cancer Paternal Grandmother   . Heart disease Maternal Uncle     Social History   Socioeconomic History  . Marital status: Married    Spouse name: Travanti  . Number of children: 1  . Years of education: [employer]  . Highest education level: 12th grade  Occupational History  . Occupation: Stay Home Mom  Tobacco Use  . Smoking status: Current Every Day Smoker    Packs/day: 1.00    Years: 5.00    Pack years: 5.00    Types: Cigarettes  . Smokeless tobacco: Never Used  Vaping Use  .  Vaping Use: Never used  Substance and Sexual Activity  . Alcohol use: Yes    Comment: 1 glass of wine 2 nights per week  . Drug use: No  . Sexual activity: Yes    Birth control/protection: None  Other Topics Concern  . Not on file  Social History Narrative   Lack of transportation   Social Determinants of Health   Financial Resource Strain: Not on file  Food Insecurity: Not on file  Transportation Needs: Not on file  Physical Activity: Not on file  Stress: Not on file  Social Connections: Not on file  Intimate Partner Violence: Not on file    Outpatient Medications Prior to Visit  Medication Sig Dispense Refill  . acetaminophen (TYLENOL) 500 MG tablet Take 1 tablet (500 mg total) by mouth every 6 (six) hours as needed. 30 tablet 0  . albuterol (VENTOLIN HFA) 108 (90 Base) MCG/ACT inhaler Inhale 1-2 puffs into the lungs every 6 (six) hours as needed for wheezing or shortness of breath. 1 each 0  . calcium carbonate (TUMS - DOSED IN MG ELEMENTAL CALCIUM) 500 MG chewable tablet Chew 2 tablets by mouth 2 (two) times daily as needed for indigestion or heartburn.    . Prenatal MV & Min  w/FA-DHA (PRENATAL ADULT GUMMY/DHA/FA) 0.4-25 MG CHEW Chew 1 tablet by mouth daily. 90 tablet 3  . ibuprofen (ADVIL,MOTRIN) 800 MG tablet Take 1 tablet (800 mg total) by mouth 3 (three) times daily. 30 tablet 0  . propranolol ER (INDERAL LA) 80 MG 24 hr capsule Take 1 capsule (80 mg total) by mouth daily. (Patient not taking: Reported on 06/23/2020) 90 capsule 1   No facility-administered medications prior to visit.    Allergies  Allergen Reactions  . Augmentin [Amoxicillin-Pot Clavulanate] Rash  . Amoxicillin Hives, Itching and Rash    Review of Systems  Constitutional: Negative for chills and fever.  Respiratory: Negative for cough and shortness of breath.   Gastrointestinal: Negative for constipation, diarrhea, nausea and vomiting.  Genitourinary: Positive for vaginal pain. Negative for  dysuria, hematuria, vaginal bleeding and vaginal discharge.  Musculoskeletal: Negative for neck pain and neck stiffness.  Skin: Negative for rash.       Objective:    Physical Exam Vitals reviewed.  Constitutional:      General: She is not in acute distress.    Appearance: She is not diaphoretic.  HENT:     Head: Normocephalic and atraumatic.     Nose: Nose normal.     Mouth/Throat:     Mouth: Mucous membranes are moist.  Eyes:     General: No scleral icterus.    Extraocular Movements: Extraocular movements intact.     Pupils: Pupils are equal, round, and reactive to light.  Cardiovascular:     Rate and Rhythm: Normal rate and regular rhythm.     Pulses: Normal pulses.     Heart sounds: No murmur heard.   Pulmonary:     Breath sounds: Normal breath sounds. No wheezing or rales.  Musculoskeletal:     Cervical back: Neck supple. No tenderness.     Right lower leg: No edema.     Left lower leg: No edema.  Skin:    General: Skin is warm.     Findings: No rash.  Neurological:     General: No focal deficit present.     Mental Status: She is alert and oriented to person, place, and time.  Psychiatric:        Mood and Affect: Mood normal.        Behavior: Behavior normal.     BP (!) 155/86 (BP Location: Right Arm, Patient Position: Sitting, Cuff Size: Normal)   Pulse 98   Temp 98.4 F (36.9 C) (Oral)   Resp 18   Ht 5\' 10"  (1.778 m)   Wt (!) 332 lb 6.4 oz (150.8 kg)   LMP 05/15/2020   SpO2 98%   BMI 47.69 kg/m  Wt Readings from Last 3 Encounters:  06/23/20 (!) 332 lb 3.2 oz (150.7 kg)  06/23/20 (!) 332 lb 6.4 oz (150.8 kg)  05/19/20 (!) 339 lb (153.8 kg)    There are no preventive care reminders to display for this patient.  There are no preventive care reminders to display for this patient.   Lab Results  Component Value Date   TSH 2.700 05/13/2020   Lab Results  Component Value Date   WBC 7.7 06/23/2020   HGB 15.0 06/23/2020   HCT 42.9 06/23/2020    MCV 90.5 06/23/2020   PLT 259 06/23/2020   Lab Results  Component Value Date   NA 140 05/13/2020   K 4.2 05/13/2020   CO2 21 05/13/2020   GLUCOSE 83 05/13/2020   BUN 12 05/13/2020  CREATININE 0.88 05/13/2020   BILITOT 0.4 05/13/2020   ALKPHOS 66 05/13/2020   AST 20 05/13/2020   ALT 32 05/13/2020   PROT 6.3 05/13/2020   ALBUMIN 4.2 05/13/2020   CALCIUM 8.9 05/13/2020   ANIONGAP 6 05/10/2020   Lab Results  Component Value Date   CHOL 213 (H) 05/13/2020   Lab Results  Component Value Date   HDL 33 (L) 05/13/2020   Lab Results  Component Value Date   LDLCALC 156 (H) 05/13/2020   Lab Results  Component Value Date   TRIG 130 05/13/2020   Lab Results  Component Value Date   CHOLHDL 4.5 (H) 08/31/2018   Lab Results  Component Value Date   HGBA1C 4.8 12/14/2017       Assessment & Plan:   Problem List Items Addressed This Visit      Positive UPT Referred to OB/GYN for further management Advised to quit smoking and avoid alcoholic beverages Avoid NSAIDs, prefer Tylenol  Cardiovascular and Mediastinum   Essential hypertension BP Readings from Last 1 Encounters:  06/23/20 (!) 126/91   Uncontrolled Switched to Labetalol due to pregnancy Counseled for compliance with the medications Advised DASH diet and moderate exercise/walking, at least 150 mins/week    Relevant Medications   labetalol (NORMODYNE) 100 MG tablet        Meds ordered this encounter  Medications  . labetalol (NORMODYNE) 100 MG tablet    Sig: Take 1 tablet (100 mg total) by mouth 2 (two) times daily.    Dispense:  60 tablet    Refill:  0     Primrose Oler Concha Se, MD

## 2020-06-23 NOTE — MAU Provider Note (Signed)
History     CSN: [id number]  Arrival date and time: 06/23/20 1215   Event Date/Time   First Provider Initiated Contact with Patient 06/23/20 1311      Chief Complaint  Patient presents with  . Abdominal Pain   HPI Kylie Bailey is a 30 y.o. G3P2002 at [redacted]w[redacted]d by unsure LMP who presents with abdominal cramping. Reports LLQ abdominal pain that started 3 days ago & is intermittent. Pain is described as sharp cramping that radiates to her pelvis & causes pressure in her vagina. Rates pain 9/10. Has not treated symptoms. Nothing makes pain better or worse. Denies fever, n/v/d, constipation, dysuria, or vaginal bleeding.   OB History    Gravida  3   Para  2   Term  2   Preterm      AB      Living  2     SAB      IAB      Ectopic      Multiple  0   Live Births  2           Past Medical History:  Diagnosis Date  . Anxiety   . Bipolar disorder (HCC)   . Bulimia   . Depression   . GERD (gastroesophageal reflux disease)   . Hypertension   . Migraines   . Panic attacks   . Seasonal allergies     Past Surgical History:  Procedure Laterality Date  . TONSILLECTOMY    . TYMPANOSTOMY TUBE PLACEMENT Bilateral     Family History  Problem Relation Age of Onset  . Hypertension Mother   . Heart attack Mother   . Drug abuse Father   . High Cholesterol Brother   . Diabetes Paternal Grandmother   . High blood pressure Paternal Grandmother   . Heart disease Paternal Grandmother   . Cancer Paternal Grandmother   . Heart disease Maternal Uncle     Social History   Tobacco Use  . Smoking status: Current Every Day Smoker    Packs/day: 1.00    Years: 5.00    Pack years: 5.00    Types: Cigarettes  . Smokeless tobacco: Never Used  Vaping Use  . Vaping Use: Never used  Substance Use Topics  . Alcohol use: Yes    Comment: 1 glass of wine 2 nights per week  . Drug use: No    Allergies:  Allergies  Allergen Reactions  . Augmentin [Amoxicillin-Pot  Clavulanate] Rash  . Amoxicillin Hives, Itching and Rash    No medications prior to admission.    Review of Systems  Constitutional: Negative.   Gastrointestinal: Positive for abdominal pain. Negative for constipation, diarrhea, nausea and vomiting.  Genitourinary: Negative.    Physical Exam   Blood pressure 130/76, pulse 89, temperature 98 F (36.7 C), temperature source Oral, resp. rate 20, height 5\' 10"  (1.778 m), weight (!) 150.7 kg, last menstrual period 05/15/2020, SpO2 100 %, not currently breastfeeding.  Physical Exam Vitals and nursing note reviewed.  Constitutional:      General: She is not in acute distress.    Appearance: She is well-developed. She is obese.  HENT:     Head: Normocephalic and atraumatic.  Pulmonary:     Effort: Pulmonary effort is normal. No respiratory distress.  Abdominal:     Palpations: Abdomen is soft.     Tenderness: There is abdominal tenderness in the left lower quadrant. There is no rebound.  Skin:    General: Skin  is warm and dry.  Neurological:     Mental Status: She is alert.  Psychiatric:        Mood and Affect: Mood normal.        Behavior: Behavior normal.     MAU Course  Procedures Results for orders placed or performed during the hospital encounter of 06/23/20 (from the past 24 hour(s))  Urinalysis, Routine w reflex microscopic Urine, Clean Catch     Status: Abnormal   Collection Time: 06/23/20 12:32 PM  Result Value Ref Range   Color, Urine YELLOW YELLOW   APPearance CLEAR CLEAR   Specific Gravity, Urine >1.030 (H) 1.005 - 1.030   pH 5.5 5.0 - 8.0   Glucose, UA NEGATIVE NEGATIVE mg/dL   Hgb urine dipstick NEGATIVE NEGATIVE   Bilirubin Urine NEGATIVE NEGATIVE   Ketones, ur NEGATIVE NEGATIVE mg/dL   Protein, ur NEGATIVE NEGATIVE mg/dL   Nitrite NEGATIVE NEGATIVE   Leukocytes,Ua NEGATIVE NEGATIVE  CBC     Status: None   Collection Time: 06/23/20  1:45 PM  Result Value Ref Range   WBC 7.7 4.0 - 10.5 K/uL   RBC 4.74  3.87 - 5.11 MIL/uL   Hemoglobin 15.0 12.0 - 15.0 g/dL   HCT 64.4 03.4 - 74.2 %   MCV 90.5 80.0 - 100.0 fL   MCH 31.6 26.0 - 34.0 pg   MCHC 35.0 30.0 - 36.0 g/dL   RDW 59.5 63.8 - 75.6 %   Platelets 259 150 - 400 K/uL   nRBC 0.0 0.0 - 0.2 %  hCG, quantitative, pregnancy     Status: Abnormal   Collection Time: 06/23/20  1:45 PM  Result Value Ref Range   hCG, Beta Chain, Quant, S 377 (H) <5 mIU/mL   US OB LESS THAN 14 WEEKS WITH OB TRANSVAGINAL  Result Date: 06/23/2020 CLINICAL DATA:  Abdominal pain during first trimester of pregnancy, LMP 05/15/2020; current quantitative beta HCG = 377 EXAM: OBSTETRIC <14 WK [place] AND TRANSVAGINAL OB US TECHNIQUE: Both transabdominal and transvaginal ultrasound examinations were performed for complete evaluation of the gestation as well as the maternal uterus, adnexal regions, and pelvic cul-de-sac. Transvaginal technique was performed to assess early pregnancy. COMPARISON:  None. FINDINGS: Intrauterine gestational sac: Not definitely visualize Yolk sac:  N/A Embryo:  N/A Cardiac Activity: N/A Heart Rate: N/A  bpm MSD:   mm    w     d CRL:    mm    w    d                  [place] EDC: Subchorionic hemorrhage:  N/A Maternal uterus/adnexae: Uterus anteverted, normal morphology. No uterine mass or gestational sac identified. Small amount of nonspecific endometrial fluid at upper uterine segment, linear. RIGHT ovary normal size and morphology 2.7 x 1.8 x 2.3 cm. LEFT ovary normal size and morphology 3.5 x 2.4 x 2.5 cm. No adnexal masses. Small amount of nonspecific free pelvic fluid. IMPRESSION: Small amount of nonspecific endometrial fluid. No intrauterine gestational sac identified. Findings are consistent with pregnancy of unknown location. Differential diagnosis includes early intrauterine pregnancy too early to visualize, spontaneous abortion, and ectopic pregnancy. Serial quantitative beta HCG and or follow-up ultrasound recommended to definitively exclude ectopic pregnancy.  Electronically Signed   By: Ulyses Southward M.D.   On: 06/23/2020 15:54    MDM +UPT UA, wet prep, GC/chlamydia, CBC, ABO/Rh, quant hCG, and [place] today to rule out ectopic pregnancy which can be life threatening.   Ultrasound  shows no IUP or adnexal mass. HCG today is 377.  This abdominal pain could represent a normal pregnancy, spontaneous abortion, or even an ectopic pregnancy which can be life-threatening.   Will schedule for stat hcg later this week at [facility]  Assessment and Plan   1. Pregnancy of unknown anatomic location   2. Abdominal pain during pregnancy in first trimester    -reviewed ectopic precautions -take tylenol prn -message to [facility] for stat HCG on Wednesday or Thursday  Judeth Horn 06/23/2020, 4:20 PM

## 2020-06-25 ENCOUNTER — Other Ambulatory Visit: Payer: Medicaid Other

## 2020-06-25 DIAGNOSIS — O3680X0 Pregnancy with inconclusive fetal viability, not applicable or unspecified: Secondary | ICD-10-CM | POA: Diagnosis not present

## 2020-06-26 ENCOUNTER — Other Ambulatory Visit: Payer: Self-pay | Admitting: Adult Health

## 2020-06-26 DIAGNOSIS — O3680X0 Pregnancy with inconclusive fetal viability, not applicable or unspecified: Secondary | ICD-10-CM

## 2020-06-26 LAB — BETA HCG QUANT (REF LAB): hCG Quant: 728 m[IU]/mL

## 2020-06-27 DIAGNOSIS — O3680X0 Pregnancy with inconclusive fetal viability, not applicable or unspecified: Secondary | ICD-10-CM | POA: Diagnosis not present

## 2020-06-28 LAB — BETA HCG QUANT (REF LAB): hCG Quant: 1705 m[IU]/mL

## 2020-06-30 ENCOUNTER — Telehealth: Payer: Self-pay | Admitting: Women's Health

## 2020-06-30 NOTE — Telephone Encounter (Signed)
Pt aware that [facility] is rising make [place] appt

## 2020-06-30 NOTE — Telephone Encounter (Signed)
Patient called stating that she went to the lab last week to check her HCG levels. Patient would like to know the results and what is her next step. Please contact pt

## 2020-07-01 ENCOUNTER — Other Ambulatory Visit: Payer: Self-pay | Admitting: Obstetrics & Gynecology

## 2020-07-01 DIAGNOSIS — O3680X0 Pregnancy with inconclusive fetal viability, not applicable or unspecified: Secondary | ICD-10-CM

## 2020-07-02 ENCOUNTER — Ambulatory Visit (INDEPENDENT_AMBULATORY_CARE_PROVIDER_SITE_OTHER): Payer: Medicaid Other

## 2020-07-02 ENCOUNTER — Other Ambulatory Visit: Payer: Self-pay

## 2020-07-02 DIAGNOSIS — Z3A01 Less than 8 weeks gestation of pregnancy: Secondary | ICD-10-CM

## 2020-07-02 DIAGNOSIS — O3680X0 Pregnancy with inconclusive fetal viability, not applicable or unspecified: Secondary | ICD-10-CM

## 2020-07-02 NOTE — Progress Notes (Signed)
[place] 6+1 wks GS with YS,GS 13.6 mm,no fetal pole visualized,normal ovaries,pt will come back for f/u ultrasound in 2 wks per Victorino Dike

## 2020-07-08 ENCOUNTER — Other Ambulatory Visit: Payer: Medicaid Other

## 2020-07-14 ENCOUNTER — Other Ambulatory Visit: Payer: Self-pay | Admitting: Adult Health

## 2020-07-14 DIAGNOSIS — O3680X0 Pregnancy with inconclusive fetal viability, not applicable or unspecified: Secondary | ICD-10-CM

## 2020-07-15 ENCOUNTER — Ambulatory Visit (INDEPENDENT_AMBULATORY_CARE_PROVIDER_SITE_OTHER): Payer: Medicaid Other | Admitting: Adult Health

## 2020-07-15 ENCOUNTER — Ambulatory Visit (INDEPENDENT_AMBULATORY_CARE_PROVIDER_SITE_OTHER): Payer: Medicaid Other

## 2020-07-15 ENCOUNTER — Encounter: Payer: Self-pay | Admitting: Adult Health

## 2020-07-15 ENCOUNTER — Other Ambulatory Visit: Payer: Self-pay

## 2020-07-15 VITALS — BP 143/95 | HR 96 | Ht 69.0 in | Wt 332.5 lb

## 2020-07-15 DIAGNOSIS — Z640 Problems related to unwanted pregnancy: Secondary | ICD-10-CM | POA: Diagnosis not present

## 2020-07-15 DIAGNOSIS — O3680X0 Pregnancy with inconclusive fetal viability, not applicable or unspecified: Secondary | ICD-10-CM | POA: Diagnosis not present

## 2020-07-15 NOTE — Progress Notes (Signed)
  Subjective:     Patient ID: Kylie Bailey, female   DOB: Aug 09, 1990, 30 y.o.   MRN: [id number]  HPI Kylie Bailey is a 30 year old white female, married, G3P2002, in to talk about this pregnancy, she had [place] this am and has IUP, CRL 13.37 mm and +FHM, about 7+4 weeks with EDD 02/27/21. She says like chaotic, daughter has has bone cancer and in wheel chair and has 28 year old, and does not fel she can have this baby and husband does not want it. PCP is Laury Axon NOP  Review of Systems +missed period with +IUP Reviewed past medical,surgical, social and family history. Reviewed medications and allergies.     Objective:   Physical Exam BP (!) 143/95 (BP Location: Right Arm, Patient Position: Sitting, Cuff Size: Large)   Pulse 96   Ht 5\' 9"  (1.753 m)   Wt (!) 332 lb 8 oz (150.8 kg)   LMP 05/15/2020   BMI 49.10 kg/m  Skin warm and dry.  Lungs: clear to ausculation bilaterally. Cardiovascular: regular rate and rhythm.   Reviewed 07/13/2020 with her and she does not want to continue the pregnancy  Fall risk is low  Upstream - 07/15/20 1506      Pregnancy Intention Screening   Does the patient want to become pregnant in the next year? N/A    Does the patient's partner want to become pregnant in the next year? N/A    Would the patient like to discuss contraceptive options today? N/A      Contraception Wrap Up   Current Method Pregnant/Seeking Pregnancy    End Method Pregnant/Seeking Pregnancy    Contraception Counseling Provided No          Assessment:     1. Unplanned unwanted pregnancy I gave her numbers to abortion clinic in [facility]    Plan:     Follow up in 3 weeks, to discuss birth control options or precede with pregnancy if she changes her mind

## 2020-07-15 NOTE — Progress Notes (Signed)
[place] 7+4 wks single IUP with YS,positive fht 150 bpm,crl 13.37 mm,normal ovaries

## 2020-07-20 HISTORY — PX: INDUCED ABORTION: SHX677

## 2020-08-04 ENCOUNTER — Telehealth: Payer: Self-pay | Admitting: Adult Health

## 2020-08-04 NOTE — Telephone Encounter (Signed)
Returned Patient's phone call to schedule follow up appt with Cyril Mourning. LVM to call office to schedule appt.

## 2020-08-15 ENCOUNTER — Other Ambulatory Visit: Payer: Self-pay

## 2020-08-15 ENCOUNTER — Encounter: Payer: Self-pay | Admitting: Nurse Practitioner

## 2020-08-15 ENCOUNTER — Ambulatory Visit (INDEPENDENT_AMBULATORY_CARE_PROVIDER_SITE_OTHER): Payer: Medicaid Other | Admitting: Nurse Practitioner

## 2020-08-15 VITALS — BP 134/82 | HR 77 | Temp 99.1°F | Resp 20 | Ht 69.5 in | Wt 325.0 lb

## 2020-08-15 DIAGNOSIS — I10 Essential (primary) hypertension: Secondary | ICD-10-CM

## 2020-08-15 DIAGNOSIS — E669 Obesity, unspecified: Secondary | ICD-10-CM

## 2020-08-15 DIAGNOSIS — Z0000 Encounter for general adult medical examination without abnormal findings: Secondary | ICD-10-CM

## 2020-08-15 DIAGNOSIS — G43009 Migraine without aura, not intractable, without status migrainosus: Secondary | ICD-10-CM

## 2020-08-15 DIAGNOSIS — Z640 Problems related to unwanted pregnancy: Secondary | ICD-10-CM

## 2020-08-15 DIAGNOSIS — Z3009 Encounter for other general counseling and advice on contraception: Secondary | ICD-10-CM

## 2020-08-15 DIAGNOSIS — J069 Acute upper respiratory infection, unspecified: Secondary | ICD-10-CM | POA: Diagnosis not present

## 2020-08-15 DIAGNOSIS — Z0001 Encounter for general adult medical examination with abnormal findings: Secondary | ICD-10-CM | POA: Diagnosis not present

## 2020-08-15 MED ORDER — AZITHROMYCIN 250 MG PO TABS: ORAL_TABLET | ORAL | 0 refills | Status: DC

## 2020-08-15 MED ORDER — ALBUTEROL SULFATE HFA 108 (90 BASE) MCG/ACT IN AERS: 2.0000 | INHALATION_SPRAY | Freq: Four times a day (QID) | RESPIRATORY_TRACT | 0 refills | Status: DC | PRN

## 2020-08-15 NOTE — Assessment & Plan Note (Signed)
Wt Readings from Last 3 Encounters:  08/15/20 (!) 325 lb (147.4 kg)  07/15/20 (!) 332 lb 8 oz (150.8 kg)  06/23/20 (!) 332 lb 3.2 oz (150.7 kg)

## 2020-08-15 NOTE — Assessment & Plan Note (Signed)
-  we discussed birth control options  -she has migraine with aura, so we are avoiding the pill -she does not want depo injection -she would prefer nexplanon or IUD, and she will see GYN to discuss this

## 2020-08-15 NOTE — Assessment & Plan Note (Signed)
-  pregnancy terminated prior to this visit

## 2020-08-15 NOTE — Assessment & Plan Note (Signed)
-  no longer taking medications

## 2020-08-15 NOTE — Patient Instructions (Signed)
Please have fasting labs drawn 2-3 days prior to your appointment so we can discuss the results during your office visit.  

## 2020-08-15 NOTE — Progress Notes (Signed)
Acute Office Visit  Subjective:    Patient ID: Kylie Bailey, female    DOB: 1990/06/07, 30 y.o.   MRN: [id number]  Chief Complaint  Patient presents with  . Anxiety  . Depression         HPI Patient is in today for medication check. She was started on propranolol on 05/19/20 for HTN, migraine, and anxiety. This was stopped and she was switched to labetalol on 06/23/20 d/t pregnancy.  At that time, we also stopped verapamil, HCTZ, and ibuprofen.  She terminated her pregnancy. She has not been taking labetalol. She has only been taking prenatal vitamins, tylenol, and allergy medicines.  She states that she has had a cough and wheezing for 2 weeks.  Past Medical History:  Diagnosis Date  . Anxiety   . Bipolar disorder ([place])   . Bulimia   . Depression   . GERD (gastroesophageal reflux disease)   . Hypertension   . Migraines   . Panic attacks   . Seasonal allergies     Past Surgical History:  Procedure Laterality Date  . TONSILLECTOMY    . TYMPANOSTOMY TUBE PLACEMENT Bilateral     Family History  Problem Relation Age of Onset  . Hypertension Mother   . Heart attack Mother   . Drug abuse Father   . High Cholesterol Brother   . Diabetes Paternal Grandmother   . High blood pressure Paternal Grandmother   . Heart disease Paternal Grandmother   . Cancer Paternal Grandmother   . Heart disease Maternal Uncle   . Cancer Daughter        bone cancer    Social History   Socioeconomic History  . Marital status: Married    Spouse name: Travanti  . Number of children: 1  . Years of education: [employer]  . Highest education level: 12th grade  Occupational History  . Occupation: Stay Home Mom  Tobacco Use  . Smoking status: Current Every Day Smoker    Packs/day: 0.50    Years: 5.00    Pack years: 2.50    Types: Cigarettes  . Smokeless tobacco: Never Used  Vaping Use  . Vaping Use: Never used  Substance and Sexual Activity  . Alcohol use: Not Currently     Comment: 1 glass of wine 2 nights per week  . Drug use: No  . Sexual activity: Yes    Birth control/protection: None  Other Topics Concern  . Not on file  Social History Narrative   Lack of transportation   Social Determinants of Health   Financial Resource Strain: Not on file  Food Insecurity: Not on file  Transportation Needs: Not on file  Physical Activity: Not on file  Stress: Not on file  Social Connections: Not on file  Intimate Partner Violence: Not on file    Outpatient Medications Prior to Visit  Medication Sig Dispense Refill  . acetaminophen (TYLENOL) 500 MG tablet Take 1 tablet (500 mg total) by mouth every 6 (six) hours as needed. 30 tablet 0  . calcium carbonate (TUMS - DOSED IN MG ELEMENTAL CALCIUM) 500 MG chewable tablet Chew 2 tablets by mouth 2 (two) times daily as needed for indigestion or heartburn.    . Prenatal MV & Min w/FA-DHA (PRENATAL ADULT GUMMY/DHA/FA) 0.4-25 MG CHEW Chew 1 tablet by mouth daily. 90 tablet 3  . labetalol (NORMODYNE) 100 MG tablet Take 1 tablet (100 mg total) by mouth 2 (two) times daily. (Patient not taking: Reported on 08/15/2020)  60 tablet 0  . albuterol (VENTOLIN HFA) 108 (90 Base) MCG/ACT inhaler Inhale 1-2 puffs into the lungs every 6 (six) hours as needed for wheezing or shortness of breath. (Patient not taking: Reported on 08/15/2020) 1 each 0   No facility-administered medications prior to visit.    Allergies  Allergen Reactions  . Augmentin [Amoxicillin-Pot Clavulanate] Rash  . Amoxicillin Hives, Itching and Rash    Review of Systems  Constitutional: Negative for chills, fatigue and fever.  HENT: Negative.   Respiratory: Positive for cough and wheezing.   Cardiovascular: Negative.   Psychiatric/Behavioral: Negative.        Objective:    Physical Exam Constitutional:      Appearance: Normal appearance.  Cardiovascular:     Rate and Rhythm: Normal rate and regular rhythm.     Pulses: Normal pulses.     Heart  sounds: Normal heart sounds.  Pulmonary:     Effort: Pulmonary effort is normal.     Breath sounds: Normal breath sounds.  Neurological:     Mental Status: She is alert.  Psychiatric:        Mood and Affect: Mood normal.        Behavior: Behavior normal.        Thought Content: Thought content normal.        Judgment: Judgment normal.     BP 134/82   Pulse 77   Temp 99.1 F (37.3 C)   Resp 20   Ht 5' 9.5" (1.765 m)   Wt (!) 325 lb (147.4 kg)   LMP 05/15/2020   SpO2 97%   Breastfeeding Unknown   BMI 47.31 kg/m  Wt Readings from Last 3 Encounters:  08/15/20 (!) 325 lb (147.4 kg)  07/15/20 (!) 332 lb 8 oz (150.8 kg)  06/23/20 (!) 332 lb 3.2 oz (150.7 kg)    Health Maintenance Due  Topic Date Due  . COVID-19 Vaccine (1) Never done    There are no preventive care reminders to display for this patient.   Lab Results  Component Value Date   TSH 2.700 05/13/2020   Lab Results  Component Value Date   WBC 7.7 06/23/2020   HGB 15.0 06/23/2020   HCT 42.9 06/23/2020   MCV 90.5 06/23/2020   PLT 259 06/23/2020   Lab Results  Component Value Date   NA 140 05/13/2020   K 4.2 05/13/2020   CO2 21 05/13/2020   GLUCOSE 83 05/13/2020   BUN 12 05/13/2020   CREATININE 0.88 05/13/2020   BILITOT 0.4 05/13/2020   ALKPHOS 66 05/13/2020   AST 20 05/13/2020   ALT 32 05/13/2020   PROT 6.3 05/13/2020   ALBUMIN 4.2 05/13/2020   CALCIUM 8.9 05/13/2020   ANIONGAP 6 05/10/2020   Lab Results  Component Value Date   CHOL 213 (H) 05/13/2020   Lab Results  Component Value Date   HDL 33 (L) 05/13/2020   Lab Results  Component Value Date   LDLCALC 156 (H) 05/13/2020   Lab Results  Component Value Date   TRIG 130 05/13/2020   Lab Results  Component Value Date   CHOLHDL 4.5 (H) 08/31/2018   Lab Results  Component Value Date   HGBA1C 4.8 12/14/2017       Assessment & Plan:   Problem List Items Addressed This Visit      Cardiovascular and Mediastinum   Migraine     -no longer taking medications      Essential hypertension    -was  prescribed labetalol, but she isn't taking this BP is good today        Respiratory   Upper respiratory tract infection - Primary   Relevant Medications   azithromycin (ZITHROMAX) 250 MG tablet   albuterol (VENTOLIN HFA) 108 (90 Base) MCG/ACT inhaler     Other   Obesity    Wt Readings from Last 3 Encounters:  08/15/20 (!) 325 lb (147.4 kg)  07/15/20 (!) 332 lb 8 oz (150.8 kg)  06/23/20 (!) 332 lb 3.2 oz (150.7 kg)         Family planning    -we discussed birth control options  -she has migraine with aura, so we are avoiding the pill -she does not want depo injection -she would prefer nexplanon or IUD, and she will see GYN to discuss this      Unplanned unwanted pregnancy    -pregnancy terminated prior to this visit       Other Visit Diagnoses    Routine medical exam       Relevant Orders   CBC with Differential/Platelet   CMP14+EGFR   Lipid Panel With LDL/HDL Ratio       Meds ordered this encounter  Medications  . azithromycin (ZITHROMAX) 250 MG tablet    Sig: Please dispense as a z-pack    Dispense:  6 tablet    Refill:  0  . albuterol (VENTOLIN HFA) 108 (90 Base) MCG/ACT inhaler    Sig: Inhale 2 puffs into the lungs every 6 (six) hours as needed for wheezing or shortness of breath.    Dispense:  8 g    Refill:  0     Noreene Larsson, NP

## 2020-08-15 NOTE — Assessment & Plan Note (Signed)
-  was prescribed labetalol, but she isn't taking this BP is good today

## 2020-08-18 ENCOUNTER — Ambulatory Visit: Payer: Medicaid Other | Admitting: Adult Health

## 2020-08-24 DIAGNOSIS — H5213 Myopia, bilateral: Secondary | ICD-10-CM | POA: Diagnosis not present

## 2020-11-19 ENCOUNTER — Ambulatory Visit: Payer: Medicaid Other | Admitting: Adult Health

## 2020-11-19 ENCOUNTER — Other Ambulatory Visit: Payer: Self-pay

## 2020-11-19 ENCOUNTER — Encounter: Payer: Self-pay | Admitting: Adult Health

## 2020-11-19 VITALS — BP 134/99 | HR 84 | Ht 70.0 in | Wt 323.5 lb

## 2020-11-19 DIAGNOSIS — G43109 Migraine with aura, not intractable, without status migrainosus: Secondary | ICD-10-CM

## 2020-11-19 DIAGNOSIS — Z3202 Encounter for pregnancy test, result negative: Secondary | ICD-10-CM | POA: Diagnosis not present

## 2020-11-19 DIAGNOSIS — Z30018 Encounter for initial prescription of other contraceptives: Secondary | ICD-10-CM | POA: Diagnosis not present

## 2020-11-19 LAB — POCT URINE PREGNANCY: Preg Test, Ur: NEGATIVE

## 2020-11-19 MED ORDER — PHEXXI 1.8-1-0.4 % VA GEL: VAGINAL | 0 refills | Status: DC

## 2020-11-19 NOTE — Progress Notes (Signed)
  Subjective:     Patient ID: Kylie Bailey, female   DOB: 11-13-1990, 30 y.o.   MRN: [id number]  HPI Kylie Bailey is a 30 year old white female,married, G3P2012 in to discuss birth control options, she has migraines with aura. Lab Results  Component Value Date   DIAGPAP  11/16/2017    NEGATIVE FOR INTRAEPITHELIAL LESIONS OR MALIGNANCY.   PCP is Laury Axon NP  Review of Systems +migraines with aura    Objective:   Physical Exam BP (!) 134/99 (BP Location: Left Arm, Patient Position: Sitting, Cuff Size: Large)   Pulse 84   Ht 5\' 10"  (1.778 m)   Wt (!) 323 lb 8 oz (146.7 kg)   LMP 11/05/2020   Breastfeeding No   BMI 46.42 kg/m  UPT is negative  Skin warm and dry. Lungs: clear to ausculation bilaterally. Cardiovascular: regular rate and rhythm.  Fall risk is low  Upstream - 11/19/20 1406       Pregnancy Intention Screening   Does the patient want to become pregnant in the next year? No    Does the patient's partner want to become pregnant in the next year? No    Would the patient like to discuss contraceptive options today? Yes      Contraception Wrap Up   Current Method No Method - Other Reason    End Method Spermicide (Used Alone)    Contraception Counseling Provided Yes                Assessment:     1. Pregnancy examination or test, negative result   2. Encounter for initial prescription of other contraceptives Discussed options,POP,IUD, Phexxi, and she wants to try Phexxi, I gave her 6 sample tubes or 2 boxes,use 1 hour before sex    3. Migraine with aura   Plan:     Return in 4 weeks for pap and physical

## 2020-12-31 ENCOUNTER — Other Ambulatory Visit: Payer: Medicaid Other | Admitting: Adult Health

## 2021-01-04 ENCOUNTER — Encounter: Payer: Self-pay | Admitting: Emergency Medicine

## 2021-01-04 ENCOUNTER — Ambulatory Visit
Admission: EM | Admit: 2021-01-04 | Discharge: 2021-01-04 | Disposition: A | Discharge status: Home / Self Care | Payer: Medicaid Other | Attending: Internal Medicine | Admitting: Internal Medicine

## 2021-01-04 ENCOUNTER — Other Ambulatory Visit: Payer: Self-pay

## 2021-01-04 DIAGNOSIS — K047 Periapical abscess without sinus: Secondary | ICD-10-CM

## 2021-01-04 MED ORDER — CLINDAMYCIN HCL 300 MG PO CAPS: 300.0000 mg | ORAL_CAPSULE | Freq: Three times a day (TID) | ORAL | 0 refills | Status: AC

## 2021-01-04 NOTE — ED Provider Notes (Signed)
[facility]    CSN: [id number] Arrival date & time: 01/04/21  1028      History   Chief Complaint Chief Complaint  Patient presents with   Dental Pain    Right side     HPI Kylie Bailey is a 30 y.o. female comes to the urgent care with 1 week history of right upper tooth pain.  Patient has dental cavity in the right upper molar tooth.  Pain is of moderate severity, intermittent and radiates to the right ear.  She has not tried any medications to help with the tooth ache.  No fever or chills.  Pain radiates to the right ear.   HPI  Past Medical History:  Diagnosis Date   Anxiety    Bipolar disorder ([facility])    Bulimia    Depression    GERD (gastroesophageal reflux disease)    Hypertension    Migraines    Panic attacks    Seasonal allergies     Patient Active Problem List   Diagnosis Date Noted   Encounter for initial prescription of other contraceptives 11/19/2020   Pregnancy examination or test, negative result 11/19/2020   Upper respiratory tract infection 08/15/2020   Unplanned unwanted pregnancy 07/15/2020   Anxiety and depression 04/02/2020   Family planning 04/02/2020   Bipolar disorder ([facility]) 10/07/2017   Skin tag 10/07/2017   Seasonal allergies 08/18/2017   Essential hypertension 12/03/2016   Panic attacks 12/11/2015   Migraine 12/11/2015   Obesity 12/11/2015    Past Surgical History:  Procedure Laterality Date   INDUCED ABORTION  07/20/2020   TONSILLECTOMY     TYMPANOSTOMY TUBE PLACEMENT Bilateral     OB History     Gravida  3   Para  2   Term  2   Preterm      AB  1   Living  2      SAB      IAB      Ectopic      Multiple  0   Live Births  2            Home Medications    Prior to Admission medications   Medication Sig Start Date End Date Taking? Authorizing Provider  clindamycin (CLEOCIN) 300 MG capsule Take 1 capsule (300 mg total) by mouth 3 (three) times daily for 7 days. 01/04/21 01/11/21 Yes  Candice Tobey, Britta Mccreedy, MD  acetaminophen (TYLENOL) 500 MG tablet Take 1 tablet (500 mg total) by mouth every 6 (six) hours as needed. 04/15/20   Heather Roberts, NP  albuterol (VENTOLIN HFA) 108 (90 Base) MCG/ACT inhaler Inhale 2 puffs into the lungs every 6 (six) hours as needed for wheezing or shortness of breath. 08/15/20   Heather Roberts, NP  calcium carbonate (TUMS - DOSED IN MG ELEMENTAL CALCIUM) 500 MG chewable tablet Chew 2 tablets by mouth 2 (two) times daily as needed for indigestion or heartburn.    [provider]  ibuprofen (ADVIL) 200 MG tablet Take 800 mg by mouth as needed.    [provider]  Lactic Ac-Citric Ac-Pot Bitart (PHEXXI) 1.8-1-0.4 % GEL Use as directed 11/19/20   Adline Potter, NP  Prenatal MV & Min w/FA-DHA (PRENATAL ADULT GUMMY/DHA/FA) 0.4-25 MG CHEW Chew 1 tablet by mouth daily. 10/31/17   Freddrick March, MD  famotidine (PEPCID) 20 MG tablet Take 1 tablet (20 mg total) by mouth 2 (two) times daily. 09/02/18 03/08/19  Arby Barrette, MD  hydrochlorothiazide (  MICROZIDE) 12.5 MG capsule Take 1 capsule (12.5 mg total) by mouth daily. 08/31/18 03/08/19  Diallo, Lilia Argue, MD  loratadine (CLARITIN) 10 MG tablet Take 1 tablet (10 mg total) by mouth daily. 06/21/18 03/08/19  [name], [place], DO  verapamil (CALAN-SR) 120 MG CR tablet Take 1 tablet (120 mg total) by mouth daily. 06/29/18 03/08/19  Tamera Stands, DO    Family History Family History  Problem Relation Age of Onset   Hypertension Mother    Heart attack Mother    Drug abuse Father    High Cholesterol Brother    Diabetes Paternal Grandmother    High blood pressure Paternal Grandmother    Heart disease Paternal Grandmother    Cancer Paternal Grandmother    Heart disease Maternal Uncle    Cancer Daughter        bone cancer    Social History Social History   Tobacco Use   Smoking status: Every Day    Packs/day: 0.50    Years: 5.00    Pack years: 2.50    Types: Cigarettes   Smokeless  tobacco: Never  Vaping Use   Vaping Use: Never used  Substance Use Topics   Alcohol use: Not Currently    Comment: 1 glass of wine 2 nights per week   Drug use: No     Allergies   Augmentin [amoxicillin-pot clavulanate] and Amoxicillin   Review of Systems Review of Systems  Constitutional:  Negative for chills and fever.  HENT:  Positive for dental problem.   Respiratory: Negative.    Cardiovascular: Negative.     Physical Exam Triage Vital Signs ED Triage Vitals  Enc Vitals Group     BP 01/04/21 1206 137/84     Pulse Rate 01/04/21 1206 93     Resp 01/04/21 1206 20     Temp 01/04/21 1206 97.7 F (36.5 C)     Temp Source 01/04/21 1206 Oral     SpO2 01/04/21 1206 97 %     Weight 01/04/21 1205 (!) 320 lb (145.2 kg)     Height --      Head Circumference --      Peak Flow --      Pain Score 01/04/21 1205 8     Pain Loc --      Pain Edu? --      Excl. in GC? --    No data found.  Updated Vital Signs BP 137/84 (BP Location: Right Arm)   Pulse 93   Temp 97.7 F (36.5 C) (Oral)   Resp 20   Wt (!) 145.2 kg   LMP 01/04/2021 (Exact Date)   SpO2 97%   BMI 45.92 kg/m   Visual Acuity Right Eye Distance:   Left Eye Distance:   Bilateral Distance:    Right Eye Near:   Left Eye Near:    Bilateral Near:     Physical Exam Vitals and nursing note reviewed.  Constitutional:      General: She is not in acute distress.    Appearance: Normal appearance. She is not ill-appearing.  HENT:     Right Ear: Tympanic membrane normal.     Left Ear: Tympanic membrane normal.     Nose: Nose normal.     Mouth/Throat:     Mouth: Mucous membranes are moist.     Comments: Dental cavity in the second right maxillary molar tooth Cardiovascular:     Rate and Rhythm: Normal rate and regular rhythm.  Neurological:  Mental Status: She is alert.     UC Treatments / Results  Labs (all labs ordered are listed, but only abnormal results are displayed) Labs Reviewed - No data  to display  EKG   Radiology No results found.  Procedures Procedures (including critical care time)  Medications Ordered in UC Medications - No data to display  Initial Impression / Assessment and Plan / UC Course  I have reviewed the triage vital signs and the nursing notes.  Pertinent labs & imaging results that were available during my care of the patient were reviewed by me and considered in my medical decision making (see chart for details).     1.  Dental infection: Clindamycin 300 mg 3 times daily for 7 days Continue taking ibuprofen as needed for pain Follow-up with your dentist for further evaluation. If you have any further concerns please return to urgent care to be reevaluated. Final Clinical Impressions(s) / UC Diagnoses   Final diagnoses:  Dental infection     Discharge Instructions      Please take medications as prescribed Take ibuprofen as prescribed Reach out to your dentist to have some dental work done Return to urgent care if you have any further concerns.   ED Prescriptions     Medication Sig Dispense Auth. Provider   clindamycin (CLEOCIN) 300 MG capsule Take 1 capsule (300 mg total) by mouth 3 (three) times daily for 7 days. 21 capsule Katelyn Broadnax, Britta Mccreedy, MD      PDMP not reviewed this encounter.   Merrilee Jansky, MD 01/04/21 1250

## 2021-01-04 NOTE — Discharge Instructions (Addendum)
Please take medications as prescribed Take ibuprofen as prescribed Reach out to your dentist to have some dental work done Return to urgent care if you have any further concerns.

## 2021-01-04 NOTE — ED Triage Notes (Signed)
For about 1 week has had right tooth pain and right ear pain.

## 2021-01-28 ENCOUNTER — Other Ambulatory Visit (INDEPENDENT_AMBULATORY_CARE_PROVIDER_SITE_OTHER): Payer: Medicaid Other | Admitting: *Deleted

## 2021-01-28 ENCOUNTER — Other Ambulatory Visit: Payer: Self-pay

## 2021-01-28 VITALS — BP 133/92 | HR 94 | Ht 69.0 in | Wt 319.0 lb

## 2021-01-28 DIAGNOSIS — O209 Hemorrhage in early pregnancy, unspecified: Secondary | ICD-10-CM | POA: Diagnosis not present

## 2021-01-28 DIAGNOSIS — Z3201 Encounter for pregnancy test, result positive: Secondary | ICD-10-CM

## 2021-01-28 DIAGNOSIS — N926 Irregular menstruation, unspecified: Secondary | ICD-10-CM

## 2021-01-28 DIAGNOSIS — M545 Low back pain, unspecified: Secondary | ICD-10-CM | POA: Diagnosis not present

## 2021-01-28 LAB — POCT URINALYSIS DIPSTICK
Blood, UA: NEGATIVE
Glucose, UA: NEGATIVE
Ketones, UA: NEGATIVE
Leukocytes, UA: NEGATIVE
Nitrite, UA: NEGATIVE
Protein, UA: NEGATIVE

## 2021-01-28 NOTE — Progress Notes (Signed)
   NURSE VISIT- PREGNANCY CONFIRMATION/UTI Symptoms  SUBJECTIVE:  Kylie Bailey is a 30 y.o. [phone] female at [redacted]w[redacted]d by certain LMP of Patient's last menstrual period was 12/05/2020 (exact date). Here for pregnancy confirmation and urine dip. Home pregnancy test: positive x 2   She reports  radiating pain from lower abdomen to lower back  and vaginal pressure when voiding. Did have some vaginal spotting 2 weeks ago but none since. Denies fever, body aches, chills. She is not taking prenatal vitamins.    OBJECTIVE:  BP (!) 133/92 (BP Location: Left Arm, Patient Position: Sitting, Cuff Size: Large)   Pulse 94   Ht 5\' 9"  (1.753 m)   Wt (!) 319 lb (144.7 kg)   LMP 12/05/2020 (Exact Date)   BMI 47.11 kg/m   Appears well, in no apparent distress  Results for orders placed or performed in visit on 01/28/21 (from the past 24 hour(s))  POCT Urinalysis Dipstick   Collection Time: 01/28/21  3:35 PM  Result Value Ref Range   Color, UA     Clarity, UA     Glucose, UA Negative Negative   Bilirubin, UA     Ketones, UA neg    Spec Grav, UA     Blood, UA neg    pH, UA     Protein, UA Negative Negative   Urobilinogen, UA     Nitrite, UA neg    Leukocytes, UA Negative Negative   Appearance     Odor      ASSESSMENT: Positive pregnancy test, [redacted]w[redacted]d by LMP    PLAN: Schedule for dating ultrasound next available. Will order HCG today since spotting noted 2 weeks ago.  Prenatal vitamins: plans to begin OTC ASAP   Nausea medicines: not currently needed   OB packet given: Yes Ectopic precautions discussed as well.   [redacted]w[redacted]d Bianka Liberati  01/28/2021 3:35 PM

## 2021-01-28 NOTE — Progress Notes (Signed)
Chart reviewed for nurse visit. Agree with plan of care.  Adline Potter, NP 01/28/2021 3:52 PM

## 2021-01-29 LAB — BETA HCG QUANT (REF LAB): hCG Quant: 80252 m[IU]/mL

## 2021-01-30 ENCOUNTER — Telehealth: Payer: Self-pay | Admitting: Adult Health

## 2021-01-30 NOTE — Telephone Encounter (Signed)
Pt aware of QHCG, still has pain at times, like UTI, culture not back, if pain increases go to [facility]

## 2021-02-03 LAB — URINE CULTURE

## 2021-02-07 ENCOUNTER — Encounter: Payer: Self-pay | Admitting: Emergency Medicine

## 2021-02-07 ENCOUNTER — Ambulatory Visit
Admission: EM | Admit: 2021-02-07 | Discharge: 2021-02-07 | Disposition: A | Discharge status: Home / Self Care | Payer: Medicaid Other | Attending: Family Medicine | Admitting: Family Medicine

## 2021-02-07 ENCOUNTER — Other Ambulatory Visit: Payer: Self-pay

## 2021-02-07 DIAGNOSIS — R062 Wheezing: Secondary | ICD-10-CM

## 2021-02-07 DIAGNOSIS — J069 Acute upper respiratory infection, unspecified: Secondary | ICD-10-CM | POA: Diagnosis not present

## 2021-02-07 MED ORDER — ALBUTEROL SULFATE HFA 108 (90 BASE) MCG/ACT IN AERS: 1.0000 | INHALATION_SPRAY | Freq: Four times a day (QID) | RESPIRATORY_TRACT | 1 refills | Status: DC | PRN

## 2021-02-07 MED ORDER — GUAIFENESIN-DM 100-10 MG/5ML PO SYRP: 5.0000 mL | ORAL_SOLUTION | ORAL | 0 refills | Status: DC | PRN

## 2021-02-07 NOTE — ED Triage Notes (Signed)
Pt is present today with cough, sore throat, chest congestion, and fever. Pt sx started monday night

## 2021-02-07 NOTE — ED Provider Notes (Signed)
[facility]   [id number] 02/07/21 Arrival Time: 6606  ASSESSMENT & PLAN:  1. Viral URI with cough   2. Wheezing    Discussed typical duration of viral illnesses. OTC symptom care as needed.  Meds ordered this encounter  Medications   albuterol (VENTOLIN HFA) 108 (90 Base) MCG/ACT inhaler    Sig: Inhale 1-2 puffs into the lungs every 6 (six) hours as needed for wheezing or shortness of breath.    Dispense:  1 each    Refill:  1   guaiFENesin-dextromethorphan (ROBITUSSIN DM) 100-10 MG/5ML syrup    Sig: Take 5 mLs by mouth every 4 (four) hours as needed for cough.    Dispense:  118 mL    Refill:  0     Follow-up Information     Heather Roberts, NP.   Specialty: Nurse Practitioner Why: As needed. Contact information: [address]  [address] [place] [address] [phone]                 Reviewed expectations re: course of current medical issues. Questions answered. Outlined signs and symptoms indicating need for more acute intervention. Understanding verbalized. After Visit Summary given.   SUBJECTIVE: History from: patient. Kylie Bailey is a 30 y.o. female who reports: nasal congestion, dry cough, wheezing; over past week; cough bothering her the most. Otherwise feeling a bit better. Denies: fever. Normal PO intake without n/v/d.   OBJECTIVE:  Vitals:   02/07/21 0831 02/07/21 0833  BP:  (!) 136/91  Pulse: 96   Resp: 18   Temp: 98.9 F (37.2 C)   SpO2: 98%     General appearance: alert; no distress Eyes: PERRLA; EOMI; conjunctiva normal HENT: ; AT; with nasal congestion Neck: supple  Lungs: speaks full sentences without difficulty; unlabored; mild bilateral wheezing Extremities: no edema Skin: warm and dry Neurologic: normal gait Psychological: alert and cooperative; normal mood and affect   Allergies  Allergen Reactions   Augmentin [Amoxicillin-Pot Clavulanate] Rash   Amoxicillin Hives, Itching and Rash     Past Medical History:  Diagnosis Date   Anxiety    Bipolar disorder (HCC)    Bulimia    Depression    GERD (gastroesophageal reflux disease)    Hypertension    Migraines    Panic attacks    Seasonal allergies    Social History   Socioeconomic History   Marital status: Married    Spouse name: Travanti   Number of children: 1   Years of education: High School   Highest education level: 12th grade  Occupational History   Occupation: Stay Home Mom  Tobacco Use   Smoking status: Every Day    Packs/day: 0.50    Years: 5.00    Pack years: 2.50    Types: Cigarettes   Smokeless tobacco: Never  Vaping Use   Vaping Use: Never used  Substance and Sexual Activity   Alcohol use: Not Currently    Comment: 1 glass of wine 2 nights per week   Drug use: No   Sexual activity: Yes    Birth control/protection: None  Other Topics Concern   Not on file  Social History Narrative   Lack of transportation   Social Determinants of Health   Financial Resource Strain: Not on file  Food Insecurity: Not on file  Transportation Needs: Not on file  Physical Activity: Not on file  Stress: Not on file  Social Connections: Not on file  Intimate Partner Violence: Not  on file   Family History  Problem Relation Age of Onset   Hypertension Mother    Heart attack Mother    Drug abuse Father    High Cholesterol Brother    Diabetes Paternal Grandmother    High blood pressure Paternal Grandmother    Heart disease Paternal Grandmother    Cancer Paternal Grandmother    Heart disease Maternal Uncle    Cancer Daughter        bone cancer   Past Surgical History:  Procedure Laterality Date   INDUCED ABORTION  07/20/2020   TONSILLECTOMY     TYMPANOSTOMY TUBE PLACEMENT Bilateral      Mardella Layman, MD 02/07/21 [phone]

## 2021-02-12 ENCOUNTER — Other Ambulatory Visit: Payer: Self-pay | Admitting: Obstetrics & Gynecology

## 2021-02-12 DIAGNOSIS — O3680X0 Pregnancy with inconclusive fetal viability, not applicable or unspecified: Secondary | ICD-10-CM

## 2021-02-13 ENCOUNTER — Other Ambulatory Visit: Payer: Self-pay

## 2021-02-13 ENCOUNTER — Ambulatory Visit (INDEPENDENT_AMBULATORY_CARE_PROVIDER_SITE_OTHER): Payer: Medicaid Other

## 2021-02-13 DIAGNOSIS — Z3A10 10 weeks gestation of pregnancy: Secondary | ICD-10-CM | POA: Diagnosis not present

## 2021-02-13 DIAGNOSIS — O3680X0 Pregnancy with inconclusive fetal viability, not applicable or unspecified: Secondary | ICD-10-CM

## 2021-02-13 NOTE — Progress Notes (Signed)
[place] 10 wks,single IUP,CRL 31.61 mm,FHR 174 bpm,normal ovaries

## 2021-02-20 ENCOUNTER — Encounter: Payer: Medicaid Other | Admitting: Nurse Practitioner

## 2021-02-21 ENCOUNTER — Encounter (HOSPITAL_COMMUNITY): Payer: Self-pay | Admitting: *Deleted

## 2021-02-21 ENCOUNTER — Emergency Department (HOSPITAL_COMMUNITY)
Admission: EM | Admit: 2021-02-21 | Discharge: 2021-02-21 | Disposition: A | Discharge status: Home / Self Care | Payer: Medicaid Other | Attending: Emergency Medicine | Admitting: Emergency Medicine

## 2021-02-21 ENCOUNTER — Other Ambulatory Visit: Payer: Self-pay

## 2021-02-21 DIAGNOSIS — I10 Essential (primary) hypertension: Secondary | ICD-10-CM | POA: Diagnosis not present

## 2021-02-21 DIAGNOSIS — M545 Low back pain, unspecified: Secondary | ICD-10-CM | POA: Insufficient documentation

## 2021-02-21 DIAGNOSIS — F17210 Nicotine dependence, cigarettes, uncomplicated: Secondary | ICD-10-CM | POA: Insufficient documentation

## 2021-02-21 DIAGNOSIS — M5441 Lumbago with sciatica, right side: Secondary | ICD-10-CM | POA: Diagnosis not present

## 2021-02-21 DIAGNOSIS — Z79899 Other long term (current) drug therapy: Secondary | ICD-10-CM | POA: Insufficient documentation

## 2021-02-21 DIAGNOSIS — O99891 Other specified diseases and conditions complicating pregnancy: Secondary | ICD-10-CM | POA: Diagnosis not present

## 2021-02-21 DIAGNOSIS — R300 Dysuria: Secondary | ICD-10-CM | POA: Diagnosis not present

## 2021-02-21 DIAGNOSIS — Z3A11 11 weeks gestation of pregnancy: Secondary | ICD-10-CM | POA: Diagnosis not present

## 2021-02-21 LAB — URINALYSIS, ROUTINE W REFLEX MICROSCOPIC
Bilirubin Urine: NEGATIVE
Glucose, UA: NEGATIVE mg/dL
Hgb urine dipstick: NEGATIVE
Ketones, ur: 5 mg/dL — AB
Leukocytes,Ua: NEGATIVE
Nitrite: NEGATIVE
Protein, ur: NEGATIVE mg/dL
Specific Gravity, Urine: 1.024 (ref 1.005–1.030)
pH: 5 (ref 5.0–8.0)

## 2021-02-21 LAB — PREGNANCY, URINE: Preg Test, Ur: POSITIVE — AB

## 2021-02-21 MED ORDER — ACETAMINOPHEN 500 MG PO TABS
1000.0000 mg | ORAL_TABLET | Freq: Once | ORAL | Status: AC
  Administered 2021-02-21: 1000 mg via ORAL
  Filled 2021-02-21: qty 2

## 2021-02-21 MED ORDER — LIDOCAINE 5 % EX PTCH
1.0000 | MEDICATED_PATCH | CUTANEOUS | Status: DC
  Administered 2021-02-21: 1 via TRANSDERMAL
  Filled 2021-02-21: qty 1

## 2021-02-21 MED ORDER — LIDOCAINE 5 % EX PTCH: 1.0000 | MEDICATED_PATCH | CUTANEOUS | 0 refills | Status: DC

## 2021-02-21 NOTE — ED Provider Notes (Signed)
[facility] EMERGENCY DEPARTMENT Provider Note   CSN: [id number] Arrival date & time: 02/21/21  7616     History Chief Complaint  Patient presents with   Back Pain    Kylie Bailey is a 30 y.o. female.   Back Pain Associated symptoms: dysuria   Associated symptoms: no fever    Patient with history of bulimia, GERD, hypertension presents with lower back pain with radiation down the right leg.  This happened acutely 3 days ago, the pain is been constant.  It is worse when she moves, has tried Tylenol without any relief.  The pain feels like a tingling pain that starts in the lower back and radiates down the posterior aspect of her right leg.  It reproducible when she raises her leg.  No history of the same, no prior back surgeries.  Denies any history of malignancy, IV drug use, prior back surgeries, urinary retention, bilateral lower extremity weakness.    She is [redacted] weeks pregnant.  Endorses mild dysuria without hematuria.  Not having any fevers.  Past Medical History:  Diagnosis Date   Anxiety    Bipolar disorder (HCC)    Bulimia    Depression    GERD (gastroesophageal reflux disease)    Hypertension    Migraines    Panic attacks    Seasonal allergies     Patient Active Problem List   Diagnosis Date Noted   Encounter for initial prescription of other contraceptives 11/19/2020   Pregnancy examination or test, negative result 11/19/2020   Upper respiratory tract infection 08/15/2020   Unplanned unwanted pregnancy 07/15/2020   Anxiety and depression 04/02/2020   Family planning 04/02/2020   Bipolar disorder (HCC) 10/07/2017   Skin tag 10/07/2017   Seasonal allergies 08/18/2017   Essential hypertension 12/03/2016   Panic attacks 12/11/2015   Migraine 12/11/2015   Obesity 12/11/2015    Past Surgical History:  Procedure Laterality Date   INDUCED ABORTION  07/20/2020   TONSILLECTOMY     TYMPANOSTOMY TUBE PLACEMENT Bilateral      OB History     Gravida   4   Para  2   Term  2   Preterm      AB  1   Living  2      SAB      IAB      Ectopic      Multiple  0   Live Births  2           Family History  Problem Relation Age of Onset   Hypertension Mother    Heart attack Mother    Drug abuse Father    High Cholesterol Brother    Diabetes Paternal Grandmother    High blood pressure Paternal Grandmother    Heart disease Paternal Grandmother    Cancer Paternal Grandmother    Heart disease Maternal Uncle    Cancer Daughter        bone cancer    Social History   Tobacco Use   Smoking status: Every Day    Years: 5.00    Types: Cigarettes   Smokeless tobacco: Never   Tobacco comments:    3 cigarettes daily   Vaping Use   Vaping Use: Never used  Substance Use Topics   Alcohol use: Not Currently    Comment: 1 glass of wine 2 nights per week   Drug use: No    Home Medications Prior to Admission medications   Medication Sig Start Date  End Date Taking? Authorizing Provider  acetaminophen (TYLENOL) 500 MG tablet Take 1 tablet (500 mg total) by mouth every 6 (six) hours as needed. 04/15/20   Noreene Larsson, NP  albuterol (VENTOLIN HFA) 108 (90 Base) MCG/ACT inhaler Inhale 1-2 puffs into the lungs every 6 (six) hours as needed for wheezing or shortness of breath. 02/07/21   Vanessa Kick, MD  calcium carbonate (TUMS - DOSED IN MG ELEMENTAL CALCIUM) 500 MG chewable tablet Chew 2 tablets by mouth 2 (two) times daily as needed for indigestion or heartburn. Patient not taking: Reported on 01/28/2021    [provider]  guaiFENesin-dextromethorphan (ROBITUSSIN DM) 100-10 MG/5ML syrup Take 5 mLs by mouth every 4 (four) hours as needed for cough. 02/07/21   Vanessa Kick, MD  Prenatal MV & Min w/FA-DHA (PRENATAL ADULT GUMMY/DHA/FA) 0.4-25 MG CHEW Chew 1 tablet by mouth daily. Patient not taking: Reported on 01/28/2021 10/31/17   Lovenia Kim, MD  famotidine (PEPCID) 20 MG tablet Take 1 tablet (20 mg total) by mouth 2  (two) times daily. 09/02/18 03/08/19  Charlesetta Shanks, MD  hydrochlorothiazide (MICROZIDE) 12.5 MG capsule Take 1 capsule (12.5 mg total) by mouth daily. 08/31/18 03/08/19  Diallo, Earna Coder, MD  loratadine (CLARITIN) 10 MG tablet Take 1 tablet (10 mg total) by mouth daily. 06/21/18 03/08/19  [name], [place], DO  verapamil (CALAN-SR) 120 MG CR tablet Take 1 tablet (120 mg total) by mouth daily. 06/29/18 03/08/19  Glenice Bow, DO    Allergies    Augmentin [amoxicillin-pot clavulanate] and Amoxicillin  Review of Systems   Review of Systems  Constitutional:  Negative for fever.  Eyes:  Negative for visual disturbance.  Genitourinary:  Positive for dysuria. Negative for decreased urine volume, flank pain, frequency and hematuria.       No urinary retention   Musculoskeletal:  Positive for back pain.  Skin:  Negative for rash.  Allergic/Immunologic: Negative for immunocompromised state.  Neurological:  Negative for dizziness and syncope.       No saddle anesthesia, no bilateral leg numbness    Physical Exam Updated Vital Signs BP 132/89 (BP Location: Right Arm)   Pulse (!) 103   Temp 98.3 F (36.8 C) (Oral)   Resp 18   Ht 5\' 9"  (1.753 m)   Wt (!) 144.7 kg   LMP 12/05/2020 (Exact Date)   SpO2 100%   BMI 47.11 kg/m   Physical Exam Vitals and nursing note reviewed. Exam conducted with a chaperone present.  Constitutional:      Appearance: Normal appearance.  HENT:     Head: Normocephalic.  Eyes:     Extraocular Movements: Extraocular movements intact.     Pupils: Pupils are equal, round, and reactive to light.     Comments: No nystagmus   Neck:     Comments: No midline cervical tenderness. No palpable deformities.  Cardiovascular:     Rate and Rhythm: Normal rate and regular rhythm.     Pulses: Normal pulses.     Comments: DP, PT, and radial pulses 2+ and symmetrical bilaterally Pulmonary:     Effort: Pulmonary effort is normal.     Breath sounds: Normal breath sounds.   Abdominal:     Tenderness: There is no right CVA tenderness or left CVA tenderness.  Musculoskeletal:        General: Tenderness present.     Cervical back: Normal range of motion. No rigidity or tenderness.     Comments: Positive straight leg test to the right.  No midline tenderness to the spine.  Skin:    General: Skin is warm and dry.     Capillary Refill: Capillary refill takes less than 2 seconds.     Findings: No bruising or erythema.  Neurological:     Mental Status: She is alert and oriented to person, place, and time. Mental status is at baseline.     Comments: Patient is alert, oriented to personal, place and time with normal speech. Cranial nerves III-XII grossly in tact. Grip strength equal bilaterally LE strength equal bilaterally. Sensation to light touch in tact bilaterally. No gait abnormalities, patient ambulatory.    Psychiatric:        Mood and Affect: Mood normal.   ED Results / Procedures / Treatments   Labs (all labs ordered are listed, but only abnormal results are displayed) Labs Reviewed  URINALYSIS, ROUTINE W REFLEX MICROSCOPIC    EKG None  Radiology No results found.  Procedures Procedures   Medications Ordered in ED Medications  lidocaine (LIDODERM) 5 % 1 patch (has no administration in time range)    ED Course  I have reviewed the triage vital signs and the nursing notes.  Pertinent labs & imaging results that were available during my care of the patient were reviewed by me and considered in my medical decision making (see chart for details).    MDM Rules/Calculators/A&P                           Back pain most consistent with musculoskeletal spasm/strain.  Patient is neurologically intact, no focal deficits on exam. Patient is ambulatory.   No red flag symptoms - history of malignancy, prior spinal surgeries, IV drug use, severe trauma, neurologic deficits, fevers, prolonged corticosteroid use.  Doubt cauda equina - no bladder or  bowel retention/incontinence, bilateral leg numbness or weakness, saddle anesthesia. Doubt osteomyelitis or epidural abscess - no history of IVDU, fever, vertebral tenderness Doubt pyelonephritis - no CVAT, urinary complaints Doubt dissection - equal peripheral pulses, no tachycardia, story not consistent  Patient is [redacted] weeks pregnant, will advise tylenol and lidoderm patches.  Engaged in shared decision making regarding imaging, do not think would be beneficial in her case based my physical exam and her risk factors.  Patient is in agreement.  Return precautions given.     Final Clinical Impression(s) / ED Diagnoses Final diagnoses:  None    Rx / DC Orders ED Discharge Orders     None        Sherrill Raring, Hershal Coria 02/21/21 1032    Hayden Rasmussen, MD 02/21/21 1859

## 2021-02-21 NOTE — ED Triage Notes (Signed)
Pt c/o right lower back pain that radiates down to right leg x 3 days. Pt is [redacted] weeks pregnant.

## 2021-02-21 NOTE — Discharge Instructions (Addendum)
Take Tylenol as needed for the pain.  You can also try applying the Lidoderm patches.  Information provided above regarding stretching you can try at home.  Follow-up with your [facility] gynecologist as needed.  Return to the ED if things change or worsen.

## 2021-02-23 ENCOUNTER — Telehealth: Payer: Self-pay

## 2021-02-23 NOTE — Telephone Encounter (Signed)
Transition Care Management Unsuccessful Follow-up Telephone Call  Date of discharge and from where:  02/21/2021 from [facility]  Attempts:  1st Attempt  Reason for unsuccessful TCM follow-up call:  Voice mail full

## 2021-02-24 NOTE — Telephone Encounter (Signed)
Transition Care Management Unsuccessful Follow-up Telephone Call  Date of discharge and from where:  02/21/2021 from [facility]  Attempts:  2nd Attempt  Reason for unsuccessful TCM follow-up call:  Voice mail full

## 2021-02-25 ENCOUNTER — Telehealth: Payer: Self-pay | Admitting: Women's Health

## 2021-02-25 NOTE — Telephone Encounter (Signed)
Pt called to ask if it safe for her to take Sudafed, states she has a head cold & the pharmacist told her she couldn't take Sudafed until her 2nd trimester  Pt is [redacted]w[redacted]d today - please advise & notify pt

## 2021-02-25 NOTE — Telephone Encounter (Signed)
Transition Care Management Unsuccessful Follow-up Telephone Call  Date of discharge and from where:  02/21/2021 from [facility]  Attempts:  3rd Attempt  Reason for unsuccessful TCM follow-up call:  Unable to reach patient

## 2021-03-06 ENCOUNTER — Other Ambulatory Visit: Payer: Self-pay | Admitting: Obstetrics & Gynecology

## 2021-03-06 DIAGNOSIS — Z3682 Encounter for antenatal screening for nuchal translucency: Secondary | ICD-10-CM

## 2021-03-09 ENCOUNTER — Ambulatory Visit (INDEPENDENT_AMBULATORY_CARE_PROVIDER_SITE_OTHER): Payer: Medicaid Other

## 2021-03-09 ENCOUNTER — Encounter: Payer: Self-pay | Admitting: Women's Health

## 2021-03-09 ENCOUNTER — Ambulatory Visit: Payer: Medicaid Other | Admitting: *Deleted

## 2021-03-09 ENCOUNTER — Ambulatory Visit (INDEPENDENT_AMBULATORY_CARE_PROVIDER_SITE_OTHER): Payer: Medicaid Other | Admitting: Women's Health

## 2021-03-09 ENCOUNTER — Other Ambulatory Visit: Payer: Self-pay

## 2021-03-09 ENCOUNTER — Other Ambulatory Visit (HOSPITAL_COMMUNITY)
Admission: RE | Admit: 2021-03-09 | Discharge: 2021-03-09 | Disposition: A | Discharge status: Home / Self Care | Payer: Medicaid Other | Source: Ambulatory Visit | Attending: Women's Health | Admitting: Women's Health

## 2021-03-09 VITALS — BP 148/96 | HR 101 | Wt 312.0 lb

## 2021-03-09 DIAGNOSIS — O0990 Supervision of high risk pregnancy, unspecified, unspecified trimester: Secondary | ICD-10-CM | POA: Insufficient documentation

## 2021-03-09 DIAGNOSIS — O10919 Unspecified pre-existing hypertension complicating pregnancy, unspecified trimester: Secondary | ICD-10-CM

## 2021-03-09 DIAGNOSIS — O0991 Supervision of high risk pregnancy, unspecified, first trimester: Secondary | ICD-10-CM | POA: Diagnosis not present

## 2021-03-09 DIAGNOSIS — Z3A13 13 weeks gestation of pregnancy: Secondary | ICD-10-CM | POA: Insufficient documentation

## 2021-03-09 DIAGNOSIS — Z31430 Encounter of female for testing for genetic disease carrier status for procreative management: Secondary | ICD-10-CM | POA: Diagnosis not present

## 2021-03-09 DIAGNOSIS — F17200 Nicotine dependence, unspecified, uncomplicated: Secondary | ICD-10-CM | POA: Diagnosis not present

## 2021-03-09 DIAGNOSIS — Z3682 Encounter for antenatal screening for nuchal translucency: Secondary | ICD-10-CM | POA: Diagnosis not present

## 2021-03-09 DIAGNOSIS — O0992 Supervision of high risk pregnancy, unspecified, second trimester: Secondary | ICD-10-CM

## 2021-03-09 MED ORDER — LABETALOL HCL 200 MG PO TABS: 200.0000 mg | ORAL_TABLET | Freq: Two times a day (BID) | ORAL | 3 refills | Status: DC

## 2021-03-09 MED ORDER — BLOOD PRESSURE MONITOR MISC: 0 refills | Status: DC

## 2021-03-09 MED ORDER — ASPIRIN 81 MG PO TBEC: 162.0000 mg | DELAYED_RELEASE_TABLET | Freq: Every day | ORAL | 2 refills | Status: DC

## 2021-03-09 NOTE — Progress Notes (Signed)
INITIAL OBSTETRICAL VISIT Patient name: Kylie Bailey MRN [id number]  Date of birth: 1990/12/15 Chief Complaint:   Initial Prenatal Visit  History of Present Illness:   Kylie Bailey is a 30 y.o. G25P2012 Caucasian female at [redacted]w[redacted]d by LMP c/w u/s at 10 weeks with an Estimated Date of Delivery: 09/11/21 being seen today for her initial obstetrical visit.   Patient's last menstrual period was 12/05/2020 (exact date). Her obstetrical history is significant for  term SVB x 2, AB x 1 . Daughter had femur/bone cancer last year, doing well now.    Today she reports  decreased appetite, some nausea, no vomiting- declines meds.Denies depression .  Last pap 11/16/17. Results were: NILM w/ HRHPV not done  Depression screen [facility] 2/9 03/09/2021 08/15/2020 06/23/2020 05/19/2020 04/15/2020  Decreased Interest 1 0 0 1 0  Down, Depressed, Hopeless 0 0 0 1 0  PHQ - 2 Score 1 0 0 2 0  Altered sleeping 0 - - 0 0  Tired, decreased energy 1 - - 1 0  Change in appetite 1 - - 1 0  Feeling bad or failure about yourself  0 - - 1 0  Trouble concentrating 0 - - 0 0  Moving slowly or fidgety/restless 0 - - 0 0  Suicidal thoughts 0 - - 0 0  PHQ-9 Score 3 - - 5 0  Difficult doing work/chores - - - - -  Some recent data might be hidden     GAD 7 : Generalized Anxiety Score 03/09/2021 08/15/2020 04/02/2020 04/02/2020  Nervous, Anxious, on Edge 0 1 1 1   Control/stop worrying 0 1 - 1  Worry too much - different things 0 3 - 1  Trouble relaxing 1 0 - 1  Restless 1 1 - 1  Easily annoyed or irritable 0 3 - 1  Afraid - awful might happen 0 0 - 1  Total GAD 7 Score 2 9 - 7  Anxiety Difficulty - - - -     Review of Systems:   Pertinent items are noted in HPI Denies cramping/contractions, leakage of fluid, vaginal bleeding, abnormal vaginal discharge w/ itching/odor/irritation, headaches, visual changes, shortness of breath, chest pain, abdominal pain, severe nausea/vomiting, or problems with urination or bowel  movements unless otherwise stated above.  Pertinent History Reviewed:  Reviewed past medical,surgical, social, obstetrical and family history.  Reviewed problem list, medications and allergies. OB History  Gravida Para Term Preterm AB Living  4 2 2   1 2   SAB IAB Ectopic Multiple Live Births        0 2    # Outcome Date GA Lbr Len/2nd Weight Sex Delivery Anes PTL Lv  4 Current           3 AB 07/20/20 [redacted]w[redacted]d         2 Term 06/28/18 [redacted]w[redacted]d 01:18 / 00:06 8 lb 13.8 oz (4.02 kg) M Vag-Spont EPI  LIV     Birth Comments: facial brusing   1 Term 04/21/13 [redacted]w[redacted]d  8 lb 5 oz (3.771 kg) F Vag-Spont EPI N LIV   Physical Assessment:   Vitals:   03/09/21 1429  BP: (!) 148/96  Pulse: (!) 101  Weight: (!) 312 lb (141.5 kg)  Body mass index is 46.07 kg/m.       Physical Examination:  General appearance - well appearing, and in no distress  Mental status - alert, oriented to person, place, and time  Psych:  She has a normal mood  and affect  Skin - warm and dry, normal color, no suspicious lesions noted  Chest - effort normal, all lung fields clear to auscultation bilaterally  Heart - normal rate and regular rhythm  Abdomen - soft, nontender  Extremities:  No swelling or varicosities noted  Pelvic - VULVA: normal appearing vulva with no masses, tenderness or lesions  VAGINA: normal appearing vagina with normal color and discharge, no lesions  CERVIX: normal appearing cervix without discharge or lesions, no CMT  Thin prep pap is done w/ reflex HR HPV cotesting  Chaperone: Latisha Cresenzo    TODAY'S NT wnl per [facility], report not in yet  No results found for this or any previous visit (from the past 24 hour(s)).  Assessment & Plan:  1) High-Risk Pregnancy [id number] at [redacted]w[redacted]d with an Estimated Date of Delivery: 09/11/21   2) Initial OB visit  3) CHTN> no meds prior to pregnancy, rx labetalol 200mg  BID, start ASA 162mg , baseline labs today  Meds:  Meds ordered this encounter  Medications   Blood  Pressure Monitor MISC    Sig: For regular home bp monitoring during pregnancy    Dispense:  1 each    Refill:  0    O09.91 Please mail to patient   labetalol (NORMODYNE) 200 MG tablet    Sig: Take 1 tablet (200 mg total) by mouth 2 (two) times daily.    Dispense:  60 tablet    Refill:  3    Order Specific Question:   Supervising Provider    Answer:   H [[id number]]   aspirin 81 MG EC tablet    Sig: Take 2 tablets (162 mg total) by mouth daily. Swallow whole.    Dispense:  180 tablet    Refill:  2    Order Specific Question:   Supervising Provider    Answer:   H [[id number]]    Initial labs obtained Continue prenatal vitamins Reviewed n/v relief measures and warning s/s to report Reviewed recommended weight gain based on pre-gravid BMI Encouraged well-balanced diet Genetic & carrier screening discussed: requests Panorama, NT/IT, and Horizon  Ultrasound discussed; fetal survey: requested CCNC completed> form faxed if has or is planning to apply for medicaid The nature of [facility] with multiple MDs and other Advanced Practice Providers was explained to patient; also emphasized that fellows, residents, and students are part of our team. Does not have home bp cuff. Office bp cuff given: no. Rx sent: yes. Check bp weekly, let [employer] know if consistently >140/90.   Follow-up: Return in about 3 weeks (around 03/30/2021) for HROB, MD or CNM, in person.   Orders Placed This Encounter  Procedures   Urine Culture   Integrated 1   Protein / creatinine ratio, urine   Pain Management Screening Profile (10S)   Comprehensive metabolic panel   CBC/D/Plt+RPR+Rh+ABO+RubIgG...   Genetic Screening   POC Urinalysis Dipstick OB    [place] CNM, [facility] 03/09/2021 3:27 PM

## 2021-03-09 NOTE — Patient Instructions (Signed)
Kaysee, thank you for choosing our office today! We appreciate the opportunity to meet your healthcare needs. You may receive a short survey by mail, e-mail, or through [employer]. If you are happy with your care we would appreciate if you could take just a few minutes to complete the survey questions. We read all of your comments and take your feedback very seriously. Thank you again for choosing our office.  Center for [employer] [facility] Team at [facility]  [facility] ([address] [place], [place] [address]) Entrance C, located off of [address] Free 24/7 valet parking   Nausea & Vomiting Have saltine crackers or pretzels by your bed and eat a few bites before you raise your head out of bed in the morning Eat small frequent meals throughout the day instead of large meals Drink plenty of fluids throughout the day to stay hydrated, just don't drink a lot of fluids with your meals.  This can make your stomach fill up faster making you feel sick Do not brush your teeth right after you eat Products with real ginger are good for nausea, like ginger ale and ginger hard candy Make sure it says made with real ginger! Sucking on sour candy like lemon heads is also good for nausea If your prenatal vitamins make you nauseated, take them at night so you will sleep through the nausea Sea Bands If you feel like you need medicine for the nausea & vomiting please let us know If you are unable to keep any fluids or food down please let us know   Constipation Drink plenty of fluid, preferably water, throughout the day Eat foods high in fiber such as fruits, vegetables, and grains Exercise, such as walking, is a good way to keep your bowels regular Drink warm fluids, especially warm prune juice, or decaf coffee Eat a 1/2 cup of real oatmeal (not instant), 1/2 cup applesauce, and 1/2-1 cup warm prune juice every day If needed, you may take Colace (docusate sodium) stool  softener once or twice a day to help keep the stool soft.  If you still are having problems with constipation, you may take Miralax once daily as needed to help keep your bowels regular.   Home Blood Pressure Monitoring for Patients   Your provider has recommended that you check your blood pressure (BP) at least once a week at home. If you do not have a blood pressure cuff at home, one will be provided for you. Contact your provider if you have not received your monitor within 1 week.   Helpful Tips for Accurate Home Blood Pressure Checks  Don't smoke, exercise, or drink caffeine 30 minutes before checking your BP Use the restroom before checking your BP (a full bladder can raise your pressure) Relax in a comfortable upright chair Feet on the ground Left arm resting comfortably on a flat surface at the level of your heart Legs uncrossed Back supported Sit quietly and don't talk Place the cuff on your bare arm Adjust snuggly, so that only two fingertips can fit between your skin and the top of the cuff Check 2 readings separated by at least one minute Keep a log of your BP readings For a visual, please reference this diagram: http://ccnc.care/bpdiagram  Provider Name: [facility]     Phone: [phone]  Zone 1: ALL CLEAR  Continue to monitor your symptoms:  BP reading is less than 140 (top number) or less than 90 (bottom  number)  No right upper stomach pain No headaches or seeing spots No feeling nauseated or throwing up No swelling in face and hands  Zone 2: CAUTION Call your doctor's office for any of the following:  BP reading is greater than 140 (top number) or greater than 90 (bottom number)  Stomach pain under your ribs in the middle or right side Headaches or seeing spots Feeling nauseated or throwing up Swelling in face and hands  Zone 3: EMERGENCY  Seek immediate medical care if you have any of the following:  BP reading is greater than160 (top number) or  greater than 110 (bottom number) Severe headaches not improving with Tylenol Serious difficulty catching your breath Any worsening symptoms from Zone 2    First Trimester of Pregnancy The first trimester of pregnancy is from week 1 until the end of week 12 (months 1 through 3). A week after a sperm fertilizes an egg, the egg will implant on the wall of the uterus. This embryo will begin to develop into a baby. Genes from you and your partner are forming the baby. The female genes determine whether the baby is a boy or a girl. At 6-8 weeks, the eyes and face are formed, and the heartbeat can be seen on ultrasound. At the end of 12 weeks, all the baby's organs are formed.  Now that you are pregnant, you will want to do everything you can to have a healthy baby. Two of the most important things are to get good prenatal care and to follow your health care provider's instructions. Prenatal care is all the medical care you receive before the baby's birth. This care will help prevent, find, and treat any problems during the pregnancy and childbirth. BODY CHANGES Your body goes through many changes during pregnancy. The changes vary from woman to woman.  You may gain or lose a couple of pounds at first. You may feel sick to your stomach (nauseous) and throw up (vomit). If the vomiting is uncontrollable, call your health care provider. You may tire easily. You may develop headaches that can be relieved by medicines approved by your health care provider. You may urinate more often. Painful urination may mean you have a bladder infection. You may develop heartburn as a result of your pregnancy. You may develop constipation because certain hormones are causing the muscles that push waste through your intestines to slow down. You may develop hemorrhoids or swollen, bulging veins (varicose veins). Your breasts may begin to grow larger and become tender. Your nipples may stick out more, and the tissue that  surrounds them (areola) may become darker. Your gums may bleed and may be sensitive to brushing and flossing. Dark spots or blotches (chloasma, mask of pregnancy) may develop on your face. This will likely fade after the baby is born. Your menstrual periods will stop. You may have a loss of appetite. You may develop cravings for certain kinds of food. You may have changes in your emotions from day to day, such as being excited to be pregnant or being concerned that something may go wrong with the pregnancy and baby. You may have more vivid and strange dreams. You may have changes in your hair. These can include thickening of your hair, rapid growth, and changes in texture. Some women also have hair loss during or after pregnancy, or hair that feels dry or thin. Your hair will most likely return to normal after your baby is born. WHAT TO EXPECT AT YOUR PRENATAL  VISITS During a routine prenatal visit: You will be weighed to make sure you and the baby are growing normally. Your blood pressure will be taken. Your abdomen will be measured to track your baby's growth. The fetal heartbeat will be listened to starting around week 10 or 12 of your pregnancy. Test results from any previous visits will be discussed. Your health care provider may ask you: How you are feeling. If you are feeling the baby move. If you have had any abnormal symptoms, such as leaking fluid, bleeding, severe headaches, or abdominal cramping. If you have any questions. Other tests that may be performed during your first trimester include: Blood tests to find your blood type and to check for the presence of any previous infections. They will also be used to check for low iron levels (anemia) and Rh antibodies. Later in the pregnancy, blood tests for diabetes will be done along with other tests if problems develop. Urine tests to check for infections, diabetes, or protein in the urine. An ultrasound to confirm the proper growth  and development of the baby. An amniocentesis to check for possible genetic problems. Fetal screens for spina bifida and Down syndrome. You may need other tests to make sure you and the baby are doing well. HOME CARE INSTRUCTIONS  Medicines Follow your health care provider's instructions regarding medicine use. Specific medicines may be either safe or unsafe to take during pregnancy. Take your prenatal vitamins as directed. If you develop constipation, try taking a stool softener if your health care provider approves. Diet Eat regular, well-balanced meals. Choose a variety of foods, such as meat or vegetable-based protein, fish, milk and low-fat dairy products, vegetables, fruits, and whole grain breads and cereals. Your health care provider will help you determine the amount of weight gain that is right for you. Avoid raw meat and uncooked cheese. These carry germs that can cause birth defects in the baby. Eating four or five small meals rather than three large meals a day may help relieve nausea and vomiting. If you start to feel nauseous, eating a few soda crackers can be helpful. Drinking liquids between meals instead of during meals also seems to help nausea and vomiting. If you develop constipation, eat more high-fiber foods, such as fresh vegetables or fruit and whole grains. Drink enough fluids to keep your urine clear or pale yellow. Activity and Exercise Exercise only as directed by your health care provider. Exercising will help you: Control your weight. Stay in shape. Be prepared for labor and delivery. Experiencing pain or cramping in the lower abdomen or low back is a good sign that you should stop exercising. Check with your health care provider before continuing normal exercises. Try to avoid standing for long periods of time. Move your legs often if you must stand in one place for a long time. Avoid heavy lifting. Wear low-heeled shoes, and practice good posture. You may  continue to have sex unless your health care provider directs you otherwise. Relief of Pain or Discomfort Wear a good support bra for breast tenderness.   Take warm sitz baths to soothe any pain or discomfort caused by hemorrhoids. Use hemorrhoid cream if your health care provider approves.   Rest with your legs elevated if you have leg cramps or low back pain. If you develop varicose veins in your legs, wear support hose. Elevate your feet for 15 minutes, 3-4 times a day. Limit salt in your diet. Prenatal Care Schedule your prenatal visits by the  twelfth week of pregnancy. They are usually scheduled monthly at first, then more often in the last 2 months before delivery. Write down your questions. Take them to your prenatal visits. Keep all your prenatal visits as directed by your health care provider. Safety Wear your seat belt at all times when driving. Make a list of emergency phone numbers, including numbers for family, friends, the hospital, and police and fire departments. General Tips Ask your health care provider for a referral to a local prenatal education class. Begin classes no later than at the beginning of month 6 of your pregnancy. Ask for help if you have counseling or nutritional needs during pregnancy. Your health care provider can offer advice or refer you to specialists for help with various needs. Do not use hot tubs, steam rooms, or saunas. Do not douche or use tampons or scented sanitary pads. Do not cross your legs for long periods of time. Avoid cat litter boxes and soil used by cats. These carry germs that can cause birth defects in the baby and possibly loss of the fetus by miscarriage or stillbirth. Avoid all smoking, herbs, alcohol, and medicines not prescribed by your health care provider. Chemicals in these affect the formation and growth of the baby. Schedule a dentist appointment. At home, brush your teeth with a soft toothbrush and be gentle when you floss. SEEK  MEDICAL CARE IF:  You have dizziness. You have mild pelvic cramps, pelvic pressure, or nagging pain in the abdominal area. You have persistent nausea, vomiting, or diarrhea. You have a bad smelling vaginal discharge. You have pain with urination. You notice increased swelling in your face, hands, legs, or ankles. SEEK IMMEDIATE MEDICAL CARE IF:  You have a fever. You are leaking fluid from your vagina. You have spotting or bleeding from your vagina. You have severe abdominal cramping or pain. You have rapid weight gain or loss. You vomit blood or material that looks like coffee grounds. You are exposed to [place] measles and have never had them. You are exposed to fifth disease or chickenpox. You develop a severe headache. You have shortness of breath. You have any kind of trauma, such as from a fall or a car accident. Document Released: 03/16/2001 Document Revised: 08/06/2013 Document Reviewed: 01/30/2013 [facility] Patient Information 2015 [place], [place]. This information is not intended to replace advice given to you by your health care provider. Make sure you discuss any questions you have with your health care provider.

## 2021-03-09 NOTE — Progress Notes (Signed)
[place] 13+3 wks,measurements c/w dates,crl 77.05 mm,FHR 150 BPM,NB present,NT 1.8 mm,posterior placenta,normal ovaries

## 2021-03-12 LAB — INTEGRATED 1
Crown Rump Length: 77.1 mm
Gest. Age on Collection Date: 13.4 weeks
Maternal Age at EDD: 29.2 yr
Nuchal Translucency (NT): 1.8 mm
Number of Fetuses: 1
PAPP-A Value: 800 ng/mL
Weight: 312 [lb_av]

## 2021-03-12 LAB — CBC/D/PLT+RPR+RH+ABO+RUBIGG...
Antibody Screen: NEGATIVE
Basophils Absolute: 0.1 10*3/uL (ref 0.0–0.2)
Basos: 0 %
EOS (ABSOLUTE): 0.2 10*3/uL (ref 0.0–0.4)
Eos: 1 %
HCV Ab: <0.1 s/co ratio (ref 0.0–0.9)
HIV Screen 4th Generation wRfx: NONREACTIVE
Hematocrit: 38.8 % (ref 34.0–46.6)
Hemoglobin: 13.5 g/dL (ref 11.1–15.9)
Hepatitis B Surface Ag: NEGATIVE
Immature Grans (Abs): 0.1 10*3/uL (ref 0.0–0.1)
Immature Granulocytes: 1 %
Lymphocytes Absolute: 2.4 10*3/uL (ref 0.7–3.1)
Lymphs: 22 %
MCH: 31.8 pg (ref 26.6–33.0)
MCHC: 34.8 g/dL (ref 31.5–35.7)
MCV: 91 fL (ref 79–97)
Monocytes Absolute: 0.7 10*3/uL (ref 0.1–0.9)
Monocytes: 6 %
Neutrophils Absolute: 7.8 10*3/uL — ABNORMAL HIGH (ref 1.4–7.0)
Neutrophils: 70 %
Platelets: 293 10*3/uL (ref 150–450)
RBC: 4.25 x10E6/uL (ref 3.77–5.28)
RDW: 13.1 % (ref 11.7–15.4)
RPR Ser Ql: NONREACTIVE
Rh Factor: POSITIVE
Rubella Antibodies, IGG: 6.23 index (ref >0.99)
WBC: 11.1 10*3/uL — ABNORMAL HIGH (ref 3.4–10.8)

## 2021-03-12 LAB — COMPREHENSIVE METABOLIC PANEL
ALT: 10 IU/L (ref 0–32)
AST: 16 IU/L (ref 0–40)
Albumin/Globulin Ratio: 1.8 (ref 1.2–2.2)
Albumin: 4 g/dL (ref 3.9–5.0)
Alkaline Phosphatase: 70 IU/L (ref 44–121)
BUN/Creatinine Ratio: 12 (ref 9–23)
BUN: 7 mg/dL (ref 6–20)
Bilirubin Total: <0.2 mg/dL (ref 0.0–1.2)
CO2: 17 mmol/L — ABNORMAL LOW (ref 20–29)
Calcium: 9.3 mg/dL (ref 8.7–10.2)
Chloride: 104 mmol/L (ref 96–106)
Creatinine, Ser: 0.57 mg/dL (ref 0.57–1.00)
Globulin, Total: 2.2 g/dL (ref 1.5–4.5)
Glucose: 91 mg/dL (ref 70–99)
Potassium: 3.9 mmol/L (ref 3.5–5.2)
Sodium: 135 mmol/L (ref 134–144)
Total Protein: 6.2 g/dL (ref 6.0–8.5)
eGFR: 127 mL/min/{1.73_m2} (ref >59)

## 2021-03-12 LAB — PROTEIN / CREATININE RATIO, URINE
Creatinine, Urine: 260.1 mg/dL
Protein, Ur: 17 mg/dL
Protein/Creat Ratio: 65 mg/g creat (ref 0–200)

## 2021-03-12 LAB — HCV INTERPRETATION

## 2021-03-13 LAB — URINE CULTURE

## 2021-03-13 LAB — PMP SCREEN PROFILE (10S), URINE
Amphetamine Scrn, Ur: NEGATIVE ng/mL
BARBITURATE SCREEN URINE: NEGATIVE ng/mL
BENZODIAZEPINE SCREEN, URINE: NEGATIVE ng/mL
CANNABINOIDS UR QL SCN: NEGATIVE ng/mL
Cocaine (Metab) Scrn, Ur: NEGATIVE ng/mL
Creatinine(Crt), U: 279.9 mg/dL (ref 20.0–300.0)
Methadone Screen, Urine: NEGATIVE ng/mL
OXYCODONE+OXYMORPHONE UR QL SCN: NEGATIVE ng/mL
Opiate Scrn, Ur: NEGATIVE ng/mL
Ph of Urine: 5.5 (ref 4.5–8.9)
Phencyclidine Qn, Ur: NEGATIVE ng/mL
Propoxyphene Scrn, Ur: NEGATIVE ng/mL

## 2021-03-16 ENCOUNTER — Encounter: Payer: Self-pay | Admitting: Women's Health

## 2021-03-16 DIAGNOSIS — R87619 Unspecified abnormal cytological findings in specimens from cervix uteri: Secondary | ICD-10-CM | POA: Insufficient documentation

## 2021-03-16 LAB — CYTOLOGY - PAP
Chlamydia: NEGATIVE
Comment: NEGATIVE
Comment: NEGATIVE
Comment: NEGATIVE
Comment: NORMAL
HPV 16: NEGATIVE
HPV 18 / 45: NEGATIVE
High risk HPV: POSITIVE — AB
Neisseria Gonorrhea: NEGATIVE

## 2021-03-19 ENCOUNTER — Encounter: Payer: Self-pay | Admitting: Nurse Practitioner

## 2021-03-19 ENCOUNTER — Other Ambulatory Visit: Payer: Self-pay

## 2021-03-19 ENCOUNTER — Ambulatory Visit (INDEPENDENT_AMBULATORY_CARE_PROVIDER_SITE_OTHER): Payer: Medicaid Other | Admitting: Nurse Practitioner

## 2021-03-19 VITALS — BP 148/78 | HR 94 | Ht 70.0 in | Wt 305.0 lb

## 2021-03-19 DIAGNOSIS — F17200 Nicotine dependence, unspecified, uncomplicated: Secondary | ICD-10-CM | POA: Diagnosis not present

## 2021-03-19 DIAGNOSIS — K5900 Constipation, unspecified: Secondary | ICD-10-CM

## 2021-03-19 DIAGNOSIS — I10 Essential (primary) hypertension: Secondary | ICD-10-CM | POA: Diagnosis not present

## 2021-03-19 DIAGNOSIS — Z0000 Encounter for general adult medical examination without abnormal findings: Secondary | ICD-10-CM | POA: Diagnosis not present

## 2021-03-19 DIAGNOSIS — F32A Depression, unspecified: Secondary | ICD-10-CM

## 2021-03-19 DIAGNOSIS — F419 Anxiety disorder, unspecified: Secondary | ICD-10-CM | POA: Diagnosis not present

## 2021-03-19 NOTE — Patient Instructions (Addendum)
Please get your labs done today.    It is important that you exercise regularly at least 30 minutes 5 times a week.  Think about what you will eat, plan ahead. Choose " clean, green, fresh or frozen" over canned, processed or packaged foods which are more sugary, salty and fatty. 70 to 75% of food eaten should be vegetables and fruit. Three meals at set times with snacks allowed between meals, but they must be fruit or vegetables. Aim to eat over a 12 hour period , example 7 am to 7 pm, and STOP after  your last meal of the day. Drink water,generally about 64 ounces per day, no other drink is as healthy. Fruit juice is best enjoyed in a healthy way, by EATING the fruit.  Thanks for choosing [facility], we consider it a privelige to serve you.  For constipation it is important that you have an adequate intake of fruit and vegetables daily, at least 3 servings of each, as well as water intake of at least 48 ounces daily and regular exercise.

## 2021-03-19 NOTE — Progress Notes (Signed)
Established Patient Office Visit  Subjective:  Patient ID: Kylie Bailey, female    DOB: 06/22/1990  Age: 30 y.o. MRN: [id number]  CC:  Chief Complaint  Patient presents with   Annual Exam    Annual Exam    HPI Kylie Bailey presents for annual exam. [redacted] week pregnant . [facility]. Her daughter has bone ca, she has been coping well, her depression does not linger.   Past Medical History:  Diagnosis Date   Anxiety    Bipolar disorder ([place])    Bulimia    Depression    GERD (gastroesophageal reflux disease)    Hypertension    Migraines    Panic attacks    Seasonal allergies     Past Surgical History:  Procedure Laterality Date   INDUCED ABORTION  07/20/2020   TONSILLECTOMY     TYMPANOSTOMY TUBE PLACEMENT Bilateral     Family History  Problem Relation Age of Onset   Hypertension Mother    Heart attack Mother    Drug abuse Father    High Cholesterol Brother    Cancer Daughter        bone cancer   Heart disease Maternal Uncle    Diabetes Paternal Grandmother    High blood pressure Paternal Grandmother    Heart disease Paternal Grandmother    Cancer Paternal Grandmother     Social History   Socioeconomic History   Marital status: Married    Spouse name: Travanti   Number of children: 1   Years of education: High School   Highest education level: 12th grade  Occupational History   Occupation: Stay Home Mom  Tobacco Use   Smoking status: Every Day    Years: 5.00    Types: Cigarettes   Smokeless tobacco: Never   Tobacco comments:    3 cigarettes daily   Vaping Use   Vaping Use: Never used  Substance and Sexual Activity   Alcohol use: Not Currently    Comment: 1 glass of wine 2 nights per week   Drug use: No   Sexual activity: Yes    Birth control/protection: None  Other Topics Concern   Not on file  Social History Narrative   Lack of transportation   Social Determinants of Health   Financial  Resource Strain: Low Risk    Difficulty of Paying Living Expenses: Not very hard  Food Insecurity: No Food Insecurity   Worried About Charity fundraiser in the Last Year: Never true   Ran Out of Food in the Last Year: Never true  Transportation Needs: No Transportation Needs   Lack of Transportation (Medical): No   Lack of Transportation (Non-Medical): No  Physical Activity: Inactive   Days of Exercise per Week: 0 days   Minutes of Exercise per Session: 30 min  Stress: No Stress Concern Present   Feeling of Stress : Not at all  Social Connections: Moderately Isolated   Frequency of Communication with Friends and Family: More than three times a week   Frequency of Social Gatherings with Friends and Family: Never   Attends Religious Services: Never   Marine scientist or Organizations: No   Attends Music therapist: Never   Marital Status: Married  Human resources officer Violence: Not At Risk   Fear of Current or Ex-Partner: No   Emotionally Abused: No   Physically Abused: No   Sexually Abused: No  Outpatient Medications Prior to Visit  Medication Sig Dispense Refill   acetaminophen (TYLENOL) 500 MG tablet Take 1 tablet (500 mg total) by mouth every 6 (six) hours as needed. 30 tablet 0   albuterol (VENTOLIN HFA) 108 (90 Base) MCG/ACT inhaler Inhale 1-2 puffs into the lungs every 6 (six) hours as needed for wheezing or shortness of breath. 1 each 1   aspirin 81 MG EC tablet Take 2 tablets (162 mg total) by mouth daily. Swallow whole. 180 tablet 2   Blood Pressure Monitor MISC For regular home bp monitoring during pregnancy 1 each 0   calcium carbonate (TUMS - DOSED IN MG ELEMENTAL CALCIUM) 500 MG chewable tablet Chew 2 tablets by mouth 2 (two) times daily as needed for indigestion or heartburn.     labetalol (NORMODYNE) 200 MG tablet Take 1 tablet (200 mg total) by mouth 2 (two) times daily. 60 tablet 3   Prenatal MV & Min w/FA-DHA (PRENATAL ADULT GUMMY/DHA/FA) 0.4-25  MG CHEW Chew 1 tablet by mouth daily. 90 tablet 3   No facility-administered medications prior to visit.    Allergies  Allergen Reactions   Augmentin [Amoxicillin-Pot Clavulanate] Rash   Amoxicillin Hives, Itching and Rash    ROS Review of Systems  Constitutional: Negative.   HENT: Negative.    Eyes:  Positive for visual disturbance.       Wear prescription glasses  Respiratory: Negative.    Cardiovascular: Negative.   Gastrointestinal:  Positive for constipation.  Genitourinary: Negative.   Musculoskeletal: Negative.   Skin: Negative.   Allergic/Immunologic: Negative.   Neurological: Negative.   Hematological: Negative.   Psychiatric/Behavioral: Negative.       Objective:    Physical Exam Vitals and nursing note reviewed. Exam conducted with a chaperone present.  Constitutional:      General: She is not in acute distress.    Appearance: She is obese. She is not ill-appearing, toxic-appearing or diaphoretic.  HENT:     Head: Normocephalic and atraumatic.     Right Ear: Tympanic membrane, ear canal and external ear normal. There is no impacted cerumen.     Left Ear: There is no impacted cerumen.     Nose: Nose normal. No congestion or rhinorrhea.     Mouth/Throat:     Pharynx: Oropharynx is clear. No oropharyngeal exudate or posterior oropharyngeal erythema.  Eyes:     General: No scleral icterus.       Right eye: No discharge.        Left eye: No discharge.     Extraocular Movements: Extraocular movements intact.     Conjunctiva/sclera: Conjunctivae normal.     Pupils: Pupils are equal, round, and reactive to light.  Neck:     Vascular: No carotid bruit.  Cardiovascular:     Rate and Rhythm: Tachycardia present.     Pulses: Normal pulses.     Heart sounds: Normal heart sounds. No murmur heard.   No friction rub. No gallop.  Pulmonary:     Effort: Pulmonary effort is normal. No respiratory distress.     Breath sounds: Normal breath sounds. No stridor. No  wheezing, rhonchi or rales.  Chest:     Chest wall: No mass, lacerations, deformity, swelling, tenderness or edema.  Breasts:    Tanner Score is 5.     Breasts are symmetrical.     Right: No swelling, bleeding, inverted nipple, mass, nipple discharge, skin change or tenderness.     Left: No swelling, bleeding, inverted nipple,  mass, nipple discharge, skin change or tenderness.  Abdominal:     Palpations: Abdomen is soft.     Tenderness: There is no right CVA tenderness or left CVA tenderness.  Musculoskeletal:        General: No swelling, tenderness, deformity or signs of injury.     Cervical back: No rigidity or tenderness.     Right lower leg: No edema.     Left lower leg: No edema.  Lymphadenopathy:     Cervical: No cervical adenopathy.     Upper Body:     Right upper body: No supraclavicular, axillary or pectoral adenopathy.     Left upper body: No supraclavicular, axillary or pectoral adenopathy.  Skin:    Capillary Refill: Capillary refill takes less than 2 seconds.     Coloration: Skin is not jaundiced or pale.     Findings: No bruising, erythema, lesion or rash.  Neurological:     General: No focal deficit present.     Mental Status: She is alert.     Cranial Nerves: No cranial nerve deficit.     Sensory: No sensory deficit.     Motor: No weakness.     Coordination: Coordination normal.     Gait: Gait normal.     Deep Tendon Reflexes: Reflexes normal.  Psychiatric:        Mood and Affect: Mood normal.    BP (!) 148/78 (BP Location: Left Arm, Patient Position: Sitting, Cuff Size: Normal)    Pulse 94    Ht $R'5\' 10"'VM$  (1.778 m)    Wt (!) 305 lb (138.3 kg)    LMP 12/05/2020 (Exact Date)    SpO2 99%    BMI 43.76 kg/m  Wt Readings from Last 3 Encounters:  03/19/21 (!) 305 lb (138.3 kg)  03/09/21 (!) 312 lb (141.5 kg)  02/21/21 (!) 319 lb (144.7 kg)     Health Maintenance Due  Topic Date Due   Pneumococcal Vaccine 101-65 Years old (1 - PCV) Never done    There are no  preventive care reminders to display for this patient.  Lab Results  Component Value Date   TSH 2.700 05/13/2020   Lab Results  Component Value Date   WBC 11.1 (H) 03/09/2021   HGB 13.5 03/09/2021   HCT 38.8 03/09/2021   MCV 91 03/09/2021   PLT 293 03/09/2021   Lab Results  Component Value Date   NA 135 03/09/2021   K 3.9 03/09/2021   CO2 17 (L) 03/09/2021   GLUCOSE 91 03/09/2021   BUN 7 03/09/2021   CREATININE 0.57 03/09/2021   BILITOT <0.2 03/09/2021   ALKPHOS 70 03/09/2021   AST 16 03/09/2021   ALT 10 03/09/2021   PROT 6.2 03/09/2021   ALBUMIN 4.0 03/09/2021   CALCIUM 9.3 03/09/2021   ANIONGAP 6 05/10/2020   EGFR 127 03/09/2021   Lab Results  Component Value Date   CHOL 213 (H) 05/13/2020   Lab Results  Component Value Date   HDL 33 (L) 05/13/2020   Lab Results  Component Value Date   LDLCALC 156 (H) 05/13/2020   Lab Results  Component Value Date   TRIG 130 05/13/2020   Lab Results  Component Value Date   CHOLHDL 4.5 (H) 08/31/2018   Lab Results  Component Value Date   HGBA1C 4.8 12/14/2017      Assessment & Plan:   Problem List Items Addressed This Visit   None   No orders of the defined types were  placed in this encounter.   Follow-up: No follow-ups on file.    Renee Rival, FNP

## 2021-03-19 NOTE — Assessment & Plan Note (Signed)
Increase fiber, engage in regular vigorous exercise. Drink plenty of fluids.

## 2021-03-19 NOTE — Assessment & Plan Note (Signed)
Smoking cessation material given. Pt advised to quit smoking due to been pregnant and risk of lung ca. Smokes 3 cigarette daily.

## 2021-03-19 NOTE — Assessment & Plan Note (Signed)
PHQ3 stated that her symptoms does not linger, she refused referral for counseling, not on medication, denies suicidal ideation.

## 2021-03-19 NOTE — Assessment & Plan Note (Addendum)
Annual exam as documented.  Counseling done include healthy lifestyle involving committing to 150 minutes of exercise per week, heart healthy diet, and attaining healthy weight. The importance of adequate sleep also discussed.  Regular use of seat belt and home safety were also discussed . Changes in health habits are decided on by patient with goals and time frames set for achieving them. Immunization and cancer screening  needs are specifically addressed at this visit.    Pt advised to get her flu vaccine and covid vaccine, she verbalized understanding.

## 2021-03-20 LAB — VITAMIN D 25 HYDROXY (VIT D DEFICIENCY, FRACTURES): Vit D, 25-Hydroxy: 22.5 ng/mL — ABNORMAL LOW (ref 30.0–100.0)

## 2021-03-20 LAB — LIPID PANEL
Chol/HDL Ratio: 4.2 ratio (ref 0.0–4.4)
Cholesterol, Total: 198 mg/dL (ref 100–199)
HDL: 47 mg/dL (ref >39)
LDL Chol Calc (NIH): 128 mg/dL — ABNORMAL HIGH (ref 0–99)
Triglycerides: 129 mg/dL (ref 0–149)
VLDL Cholesterol Cal: 23 mg/dL (ref 5–40)

## 2021-03-20 LAB — TSH: TSH: 0.846 u[IU]/mL (ref 0.450–4.500)

## 2021-03-23 ENCOUNTER — Encounter: Payer: Medicaid Other | Admitting: Nurse Practitioner

## 2021-03-27 ENCOUNTER — Telehealth: Payer: Self-pay | Admitting: Women's Health

## 2021-03-27 ENCOUNTER — Encounter: Payer: Self-pay | Admitting: Women's Health

## 2021-03-27 ENCOUNTER — Encounter (HOSPITAL_COMMUNITY): Payer: Self-pay | Admitting: *Deleted

## 2021-03-27 ENCOUNTER — Emergency Department (HOSPITAL_COMMUNITY)
Admission: EM | Admit: 2021-03-27 | Discharge: 2021-03-27 | Disposition: A | Discharge status: Home / Self Care | Payer: Medicaid Other | Attending: Emergency Medicine | Admitting: Emergency Medicine

## 2021-03-27 DIAGNOSIS — G43109 Migraine with aura, not intractable, without status migrainosus: Secondary | ICD-10-CM | POA: Insufficient documentation

## 2021-03-27 DIAGNOSIS — G43819 Other migraine, intractable, without status migrainosus: Secondary | ICD-10-CM | POA: Diagnosis not present

## 2021-03-27 DIAGNOSIS — Z7982 Long term (current) use of aspirin: Secondary | ICD-10-CM | POA: Insufficient documentation

## 2021-03-27 DIAGNOSIS — Z79899 Other long term (current) drug therapy: Secondary | ICD-10-CM | POA: Insufficient documentation

## 2021-03-27 DIAGNOSIS — O285 Abnormal chromosomal and genetic finding on antenatal screening of mother: Secondary | ICD-10-CM | POA: Insufficient documentation

## 2021-03-27 DIAGNOSIS — F17210 Nicotine dependence, cigarettes, uncomplicated: Secondary | ICD-10-CM | POA: Diagnosis not present

## 2021-03-27 DIAGNOSIS — I10 Essential (primary) hypertension: Secondary | ICD-10-CM | POA: Insufficient documentation

## 2021-03-27 DIAGNOSIS — Z3A16 16 weeks gestation of pregnancy: Secondary | ICD-10-CM | POA: Diagnosis not present

## 2021-03-27 DIAGNOSIS — R200 Anesthesia of skin: Secondary | ICD-10-CM | POA: Diagnosis present

## 2021-03-27 DIAGNOSIS — G43519 Persistent migraine aura without cerebral infarction, intractable, without status migrainosus: Secondary | ICD-10-CM

## 2021-03-27 DIAGNOSIS — O99352 Diseases of the nervous system complicating pregnancy, second trimester: Secondary | ICD-10-CM | POA: Diagnosis not present

## 2021-03-27 MED ORDER — DIPHENHYDRAMINE HCL 50 MG/ML IJ SOLN
12.5000 mg | Freq: Once | INTRAMUSCULAR | Status: AC
  Administered 2021-03-27: 14:00:00 12.5 mg via INTRAVENOUS
  Filled 2021-03-27: qty 1

## 2021-03-27 MED ORDER — METOCLOPRAMIDE HCL 5 MG/ML IJ SOLN
10.0000 mg | Freq: Once | INTRAMUSCULAR | Status: AC
  Administered 2021-03-27: 14:00:00 10 mg via INTRAVENOUS
  Filled 2021-03-27: qty 2

## 2021-03-27 MED ORDER — SODIUM CHLORIDE 0.9 % IV BOLUS
1000.0000 mL | Freq: Once | INTRAVENOUS | Status: AC
  Administered 2021-03-27: 14:00:00 1000 mL via INTRAVENOUS

## 2021-03-27 NOTE — Telephone Encounter (Signed)
Called pt per Kylie Bailey and relayed msg. Pt confirmed understanding.

## 2021-03-27 NOTE — ED Provider Notes (Signed)
[facility] EMERGENCY DEPARTMENT Provider Note   CSN: [id number] Arrival date & time: 03/27/21  1245     History Chief Complaint  Patient presents with   facial numbness    Kylie Bailey is a 30 y.o. female. Patient presents the emergency department today after experiencing facial and left-sided numbness.  Patient states that earlier this morning she started having a couple episodes of vomiting, however this ended abruptly and she felt normally for about 2 hours.  Around noon today she started experiencing some left facial numbness and left arm numbness.  The sensations lasted about 20 minutes and resolved.  Ever since then she has had a frontal headache that is been constant.  She took Tylenol around 12:30 PM with no relief. Of note, patient is [redacted] weeks pregnant.  She has had complex migraines in the past and has been worked up for acute stroke, however her work-ups have always been negative.  During this time, her symptoms are very similar to the ones she is having today.  These were typically attributed to a complex migraine.  Her symptoms resolved with headache cocktail treatments in the emergency department.  HPI     Past Medical History:  Diagnosis Date   Anxiety    Bipolar disorder ([place])    Bulimia    Depression    GERD (gastroesophageal reflux disease)    Hypertension    Migraines    Panic attacks    Seasonal allergies     Patient Active Problem List   Diagnosis Date Noted   Abnormal chromosomal and genetic finding on antenatal screening mother 03/27/2021   Annual physical exam 03/19/2021   Constipation 03/19/2021   Abnormal Pap smear of cervix 03/16/2021   Supervision of high-risk pregnancy 03/09/2021   Smoker 03/09/2021   Upper respiratory tract infection 08/15/2020   Anxiety and depression 04/02/2020   Chronic hypertension affecting pregnancy 06/27/2018   Bipolar disorder ([place]) 10/07/2017   Skin tag 10/07/2017   Seasonal allergies 08/18/2017    Essential hypertension 12/03/2016   Panic attacks 12/11/2015   Migraine 12/11/2015   Obesity 12/11/2015    Past Surgical History:  Procedure Laterality Date   INDUCED ABORTION  07/20/2020   TONSILLECTOMY     TYMPANOSTOMY TUBE PLACEMENT Bilateral      OB History     Gravida  4   Para  2   Term  2   Preterm      AB  1   Living  2      SAB      IAB      Ectopic      Multiple  0   Live Births  2           Family History  Problem Relation Age of Onset   Hypertension Mother    Heart attack Mother    Drug abuse Father    High Cholesterol Brother    Hypertension Brother    Cancer Daughter        bone cancer   Heart disease Maternal Uncle    Diabetes Paternal Grandmother    High blood pressure Paternal Grandmother    Heart disease Paternal Grandmother    Cancer Paternal Grandmother    Colon cancer Neg Hx    Breast cancer Neg Hx    Lung cancer Neg Hx     Social History   Tobacco Use   Smoking status: Every Day    Years: 5.00    Types: Cigarettes  Smokeless tobacco: Never   Tobacco comments:    3 cigarettes daily   Vaping Use   Vaping Use: Never used  Substance Use Topics   Alcohol use: Not Currently    Comment: 1 glass of wine 2 nights per week   Drug use: No    Home Medications Prior to Admission medications   Medication Sig Start Date End Date Taking? Authorizing Provider  acetaminophen (TYLENOL) 500 MG tablet Take 1 tablet (500 mg total) by mouth every 6 (six) hours as needed. 04/15/20   Heather Roberts, NP  albuterol (VENTOLIN HFA) 108 (90 Base) MCG/ACT inhaler Inhale 1-2 puffs into the lungs every 6 (six) hours as needed for wheezing or shortness of breath. 02/07/21   Mardella Layman, MD  aspirin 81 MG EC tablet Take 2 tablets (162 mg total) by mouth daily. Swallow whole. 03/09/21   Cheral Marker, CNM  Blood Pressure Monitor MISC For regular home bp monitoring during pregnancy 03/09/21   Cheral Marker, CNM  calcium carbonate (TUMS  - DOSED IN MG ELEMENTAL CALCIUM) 500 MG chewable tablet Chew 2 tablets by mouth 2 (two) times daily as needed for indigestion or heartburn.    [provider]  labetalol (NORMODYNE) 200 MG tablet Take 1 tablet (200 mg total) by mouth 2 (two) times daily. 03/09/21   Cheral Marker, CNM  Prenatal MV & Min w/FA-DHA (PRENATAL ADULT GUMMY/DHA/FA) 0.4-25 MG CHEW Chew 1 tablet by mouth daily. 10/31/17   Freddrick March, MD  famotidine (PEPCID) 20 MG tablet Take 1 tablet (20 mg total) by mouth 2 (two) times daily. 09/02/18 03/08/19  Arby Barrette, MD  hydrochlorothiazide (MICROZIDE) 12.5 MG capsule Take 1 capsule (12.5 mg total) by mouth daily. 08/31/18 03/08/19  Diallo, Lilia Argue, MD  loratadine (CLARITIN) 10 MG tablet Take 1 tablet (10 mg total) by mouth daily. 06/21/18 03/08/19  [name], [place], DO  verapamil (CALAN-SR) 120 MG CR tablet Take 1 tablet (120 mg total) by mouth daily. 06/29/18 03/08/19  Tamera Stands, DO    Allergies    Augmentin [amoxicillin-pot clavulanate] and Amoxicillin  Review of Systems   Review of Systems  Constitutional:  Negative for chills and fever.  Musculoskeletal:  Negative for gait problem, neck pain and neck stiffness.  Neurological:  Positive for numbness and headaches. Negative for dizziness, tremors, seizures, syncope, facial asymmetry, speech difficulty, weakness and light-headedness.  Psychiatric/Behavioral:  Negative for confusion.   All other systems reviewed and are negative.  Physical Exam Updated Vital Signs BP (!) 126/52    Pulse 70    Temp 97.6 F (36.4 C) (Oral)    Resp 16    LMP 12/05/2020 (Exact Date)    SpO2 100%   Physical Exam Vitals and nursing note reviewed.  Constitutional:      General: She is not in acute distress.    Appearance: Normal appearance. She is not ill-appearing, toxic-appearing or diaphoretic.  HENT:     Head: Normocephalic and atraumatic.     Nose: No nasal deformity.     Mouth/Throat:     Lips: Pink. No lesions.      Mouth: Mucous membranes are moist. No injury, lacerations, oral lesions or angioedema.     Tongue: Tongue does not deviate from midline.     Pharynx: Oropharynx is clear. Uvula midline. No pharyngeal swelling, oropharyngeal exudate, posterior oropharyngeal erythema or uvula swelling.  Eyes:     General: Gaze aligned appropriately. No visual field deficit or scleral icterus.  Right eye: No discharge.        Left eye: No discharge.     Extraocular Movements: Extraocular movements intact.     Right eye: Normal extraocular motion and no nystagmus.     Left eye: Normal extraocular motion and no nystagmus.     Conjunctiva/sclera: Conjunctivae normal.     Right eye: Right conjunctiva is not injected. No exudate or hemorrhage.    Left eye: Left conjunctiva is not injected. No exudate or hemorrhage.    Pupils: Pupils are equal, round, and reactive to light.  Cardiovascular:     Rate and Rhythm: Normal rate and regular rhythm.     Pulses: Normal pulses.          Radial pulses are 2+ on the right side and 2+ on the left side.       Dorsalis pedis pulses are 2+ on the right side and 2+ on the left side.     Heart sounds: Normal heart sounds, S1 normal and S2 normal. Heart sounds not distant. No murmur heard.   No friction rub. No gallop. No S3 or S4 sounds.  Pulmonary:     Effort: Pulmonary effort is normal. No accessory muscle usage or respiratory distress.     Breath sounds: Normal breath sounds. No stridor. No wheezing, rhonchi or rales.  Chest:     Chest wall: No tenderness.  Abdominal:     General: Abdomen is flat. Bowel sounds are normal. There is no distension.     Palpations: Abdomen is soft. There is no mass or pulsatile mass.     Tenderness: There is no abdominal tenderness. There is no guarding or rebound.  Musculoskeletal:     Cervical back: Neck supple. No rigidity or tenderness.     Right lower leg: No edema.     Left lower leg: No edema.  Skin:    General: Skin is warm  and dry.     Coloration: Skin is not jaundiced or pale.     Findings: No bruising, erythema, lesion or rash.  Neurological:     General: No focal deficit present.     Mental Status: She is alert and oriented to person, place, and time.     GCS: GCS eye subscore is 4. GCS verbal subscore is 5. GCS motor subscore is 6.     Cranial Nerves: Cranial nerves 2-12 are intact. No cranial nerve deficit, dysarthria or facial asymmetry.     Sensory: Sensation is intact. No sensory deficit.     Motor: Motor function is intact. No weakness, tremor, atrophy, abnormal muscle tone, seizure activity or pronator drift.     Coordination: Coordination is intact. Finger-Nose-Finger Test and Heel to [place] Test normal.     Gait: Gait is intact.     Comments: Alert and Oriented x 3 Speech clear with no aphasia Cranial Nerve testing - Visual Fields grossly intact - EOM intact. No Nystagmus - PERRLA. - Facial Sensation grossly intact - No facial asymmetry - Uvula and Tongue Midline - Accessory Muscles intact Motor: - 5/5 motor strength in all four extremities.  - No pronator Drift - Normal tone Sensation: - Grossly intact in all four extremities.  Coordination:  - Finger to nose and heel to shin intact bilaterally - Gait without abnormality.   Psychiatric:        Mood and Affect: Mood normal.        Behavior: Behavior normal. Behavior is cooperative.    ED Results / Procedures /  Treatments   Labs (all labs ordered are listed, but only abnormal results are displayed) Labs Reviewed - No data to display  EKG None  Radiology No results found.  Procedures Procedures   Medications Ordered in ED Medications  sodium chloride 0.9 % bolus 1,000 mL (1,000 mLs Intravenous New Bag/Given 03/27/21 1351)  diphenhydrAMINE (BENADRYL) injection 12.5 mg (12.5 mg Intravenous Given 03/27/21 1352)  metoCLOPramide (REGLAN) injection 10 mg (10 mg Intravenous Given 03/27/21 1353)    ED Course  I have reviewed  the triage vital signs and the nursing notes.  Pertinent labs & imaging results that were available during my care of the patient were reviewed by me and considered in my medical decision making (see chart for details).    MDM Rules/Calculators/A&P                          This is a 30 y.o. female with a PMH of complex migraines, hypertension, bipolar, g2,p1 currently 16 weeks pregant who presents to the ED with transient numbness of left arm and left cheek. Currently has persistent frontal headache.   Review of Past Records: Previous record of similar symptoms multiple ED visits in the past.  Neurological work-ups were negative at that time and symptoms were attributed to complex migraine.  Vitals:  stable. BP lower than baseline.   Exam: Neurologically intact with no focal deficits on exam.  Symptoms have completely resolved.  Initial Impression: Suspect complex migraine given similar symptoms in the past and resolved neurological symptoms.  We will proceed with migraine cocktail.  Events/Interventions: Reglan, Benadryl, IVF  On Reassessment, symptoms resolved  Impression: Complex migraine resolved with treatment  Dispo Plan: neurology f/u. PCP and OBGYN f/u  I have seen and evaluated this patient in conjunction with my attending physician who agrees and has made changes to the plan accordingly.  Portions of this note were generated with [employer]. Dictation errors may occur despite best attempts at proofreading.   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

## 2021-03-27 NOTE — ED Triage Notes (Signed)
Facial numbness left side, states it last ed about 30 minutes, history of migraines, [redacted] weeks pregnant

## 2021-03-27 NOTE — Telephone Encounter (Signed)
Patient needs help understanding the message you sent her. Please advise.

## 2021-03-27 NOTE — Discharge Instructions (Signed)
You have been seen in the emergency department for a complex migraine.  I provided you a neurology referral in your discharge paperwork.  These follow-up with your PCP and OB/GYN.

## 2021-03-28 ENCOUNTER — Telehealth: Payer: Self-pay

## 2021-03-28 NOTE — Telephone Encounter (Signed)
Transition Care Management Unsuccessful Follow-up Telephone Call  Date of discharge and from where:  03/27/2021-[place]   Attempts:  1st Attempt  Reason for unsuccessful TCM follow-up call:  Unable to leave message

## 2021-03-31 ENCOUNTER — Other Ambulatory Visit: Payer: Self-pay

## 2021-03-31 ENCOUNTER — Ambulatory Visit (INDEPENDENT_AMBULATORY_CARE_PROVIDER_SITE_OTHER): Payer: Medicaid Other | Admitting: Women's Health

## 2021-03-31 ENCOUNTER — Encounter: Payer: Self-pay | Admitting: Women's Health

## 2021-03-31 VITALS — BP 130/85 | HR 84 | Wt 308.5 lb

## 2021-03-31 DIAGNOSIS — R87612 Low grade squamous intraepithelial lesion on cytologic smear of cervix (LGSIL): Secondary | ICD-10-CM | POA: Diagnosis not present

## 2021-03-31 DIAGNOSIS — Z3A16 16 weeks gestation of pregnancy: Secondary | ICD-10-CM

## 2021-03-31 DIAGNOSIS — O0992 Supervision of high risk pregnancy, unspecified, second trimester: Secondary | ICD-10-CM

## 2021-03-31 DIAGNOSIS — Z1379 Encounter for other screening for genetic and chromosomal anomalies: Secondary | ICD-10-CM | POA: Diagnosis not present

## 2021-03-31 LAB — POCT URINALYSIS DIPSTICK OB
Blood, UA: NEGATIVE
Glucose, UA: NEGATIVE
Ketones, UA: NEGATIVE
Leukocytes, UA: NEGATIVE
Nitrite, UA: NEGATIVE
POC,PROTEIN,UA: NEGATIVE

## 2021-03-31 NOTE — Telephone Encounter (Signed)
Transition Care Management Follow-up Telephone Call Date of discharge and from where: 03/27/2021 from [facility] How have you been since you were released from the hospital? Pt stated that she is feeling better and did not have any questions or concerns at this time.  Any questions or concerns? No  Items Reviewed: Did the pt receive and understand the discharge instructions provided? Yes  Medications obtained and verified? Yes  Other? No  Any new allergies since your discharge? No  Dietary orders reviewed? No Do you have support at home? Yes   Functional Questionnaire: (I = Independent and D = Dependent) ADLs: I  Bathing/Dressing- I  Meal Prep- I  Eating- I  Maintaining continence- I  Transferring/Ambulation- I  Managing Meds- I   Follow up appointments reviewed:  PCP Hospital f/u appt confirmed? No   Specialist Hospital f/u appt confirmed? No  Pt saw OBGYN today.  Are transportation arrangements needed? No  If their condition worsens, is the pt aware to call PCP or go to the Emergency Dept.? Yes Was the patient provided with contact information for the PCP's office or ED? Yes Was to pt encouraged to call back with questions or concerns? Yes

## 2021-03-31 NOTE — Addendum Note (Signed)
Addended by: Annamarie Dawley on: 03/31/2021 10:19 AM   Modules accepted: Orders

## 2021-03-31 NOTE — Progress Notes (Signed)
HIGH-RISK PREGNANCY VISIT WITH COLPOSCOPY Patient name: Kylie Bailey MRN [id number]  Date of birth: [birth year] Chief Complaint:   Routine Prenatal Visit (Colpo)  History of Present Illness:   Kylie Bailey is a 30 y.o. [phone] female at [redacted]w[redacted]d with an Estimated Date of Delivery: 09/11/21 being seen today for ongoing management of a high-risk pregnancy complicated by chronic hypertension currently on labetalol 200mg  BID.    Today she reports no complaints. Contractions: Not present. Vag. Bleeding: None.  Movement: Absent. denies leaking of fluid.   Depression screen [facility] 2/9 03/19/2021 03/09/2021 08/15/2020 06/23/2020 05/19/2020  Decreased Interest 0 1 0 0 1  Down, Depressed, Hopeless 0 0 0 0 1  PHQ - 2 Score 0 1 0 0 2  Altered sleeping 0 0 - - 0  Tired, decreased energy 0 1 - - 1  Change in appetite 0 1 - - 1  Feeling bad or failure about yourself  0 0 - - 1  Trouble concentrating 0 0 - - 0  Moving slowly or fidgety/restless 0 0 - - 0  Suicidal thoughts 0 0 - - 0  PHQ-9 Score 0 3 - - 5  Difficult doing work/chores - - - - -  Some recent data might be hidden     GAD 7 : Generalized Anxiety Score 03/09/2021 08/15/2020 04/02/2020 04/02/2020  Nervous, Anxious, on Edge 0 1 1 1   Control/stop worrying 0 1 - 1  Worry too much - different things 0 3 - 1  Trouble relaxing 1 0 - 1  Restless 1 1 - 1  Easily annoyed or irritable 0 3 - 1  Afraid - awful might happen 0 0 - 1  Total GAD 7 Score 2 9 - 7  Anxiety Difficulty - - - -     Review of Systems:   Pertinent items are noted in HPI Denies abnormal vaginal discharge w/ itching/odor/irritation, headaches, visual changes, shortness of breath, chest pain, abdominal pain, severe nausea/vomiting, or problems with urination or bowel movements unless otherwise stated above. Pertinent History Reviewed:  Reviewed past medical,surgical, social, obstetrical and family history.  Reviewed problem list, medications and allergies. Physical  Assessment:   Vitals:   03/31/21 0926  BP: 130/85  Pulse: 84  Weight: (!) 308 lb 8 oz (139.9 kg)  Body mass index is 44.27 kg/m.           Physical Examination:   General appearance: alert, well appearing, and in no distress  Mental status: alert, oriented to person, place, and time  Skin: warm & dry   Extremities: Edema: None    Cardiovascular: normal heart rate noted  Respiratory: normal respiratory effort, no distress  Abdomen: gravid, soft, non-tender  Pelvic: Colpo (see note below)         Urine dipstick: neg  Fetal Status: Fetal Heart Rate (bpm): 145   Movement: Absent    Fetal Surveillance Testing today: doppler   No results found for this or any previous visit (from the past 24 hour(s)).  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   Labs/procedures today: colpo  Treatment Plan:  Growth u/s q 4wks    2x/wk testing nst/sono @ 32wks     Deliver  38-39wks (37wks or prn if poor control)____   Reviewed: Preterm labor symptoms and general obstetric precautions including but not limited to vaginal bleeding, contractions, leaking of fluid and fetal movement were reviewed in detail with the patient.  All questions were answered. Does have home bp cuff. Office bp cuff given: not applicable. Check bp weekly, let us know if consistently >150 and/or >95.  Follow-up: Return in about 3 weeks (around 04/21/2021) for HROB, [id number], MD or CNM, in person.   Future Appointments  Date Time Provider Department Center  03/19/2022  8:00 AM Paseda, Baird Kay, FNP [facility]    Orders Placed This Encounter  Procedures   INTEGRATED 2   Cheral Marker CNM, [facility] 03/31/2021 10:09 AM    COLPOSCOPY  PROCEDURE NOTE Patient name: Kylie Bailey MRN [id number]  Date of birth: Jul 04, 1990 Subjective Findings:   [name] is a 30 y.o. G89P2012 Caucasian female at [redacted]w[redacted]d being seen today for a colposcopy. Indication: Abnormal pap on 03/09/21: LSIL w/ HRHPV positive: other (not 16, 18/45)  Prior cytology:  Date Result Procedure  11/16/17 NILM w/ HRHPV not done None              Patient's last menstrual period was 12/05/2020 (exact date). Contraception: none. Menopausal: no. Hysterectomy: no.   Smoker: yes.  Immunocompromised: yes pregnant .  The risks and benefits were explained and informed consent was obtained, and written copy is in chart. Pertinent History Reviewed:   Reviewed past medical,surgical, social, obstetrical and family history.  Reviewed problem list, medications and allergies. Objective Findings & Procedure:   Vitals:   03/31/21 0926  BP: 130/85  Pulse: 84  Weight: (!) 308 lb 8 oz (139.9 kg)  Body mass index is 44.27 kg/m.  Time out was performed.  Speculum placed in the vagina, cervix fully visualized. SCJ: fully visualized. Cervix swabbed x 3 with acetic acid.  Acetowhitening present: Yes Cervix: no visible lesions, no mosaicism, no punctation, no abnormal vasculature, and acetowhite lesion(s) noted at 12 & 6 o'clock. No biopsies taken. Vagina: vaginal colposcopy not performed Vulva: vulvar colposcopy not performed  Specimens: 0  Complications: none  Chaperone: Faith Rogue    Colposcopic Impression & Plan:   Colposcopy findings consistent with LSIL Plan: Repeat colposcopy postpartum, 8wks  Return in about 3 weeks (around 04/21/2021) for HROB, [id number], MD or CNM, in person.  Cheral Marker CNM, [facility] 03/31/2021 10:09 AM

## 2021-03-31 NOTE — Patient Instructions (Signed)
Kylie Bailey, thank you for choosing our office today! We appreciate the opportunity to meet your healthcare needs. You may receive a short survey by mail, e-mail, or through [employer]. If you are happy with your care we would appreciate if you could take just a few minutes to complete the survey questions. We read all of your comments and take your feedback very seriously. Thank you again for choosing our office.  Center for [employer] Team at [facility] at [facility] ([address] [place], [place] [address]) Entrance C, located off of [address] Free 24/7 valet parking  Go to [email or web address] to register for FREE online childbirth classes  Call the office [phone]) or go to [employer] if: You begin to severe cramping Your water breaks.  Sometimes it is a big gush of fluid, sometimes it is just a trickle that keeps getting your panties wet or running down your legs You have vaginal bleeding.  It is normal to have a small amount of spotting if your cervix was checked.   [facility] Pediatricians/Family Doctors [facility]): [address] [place], [phone]           [facility]: [address], [phone]                [facility]): [address], [phone] (call to ask if accepting patients) [facility]: [address], [place], [phone]    [facility] Pediatricians/Family Doctors [facility]): [phone] [address], [phone] [facility]: [address] [place], [phone] [facility]: [address], [phone]  [facility] Doctors  [facility]): [phone] [facility]: [address] [phone]   [facility] Doctors [facility]: [address] [phone]  [facility] Doctors  [facility]: [phone], [phone]  Home Blood Pressure Monitoring for Patients   Your provider has recommended that you check your blood pressure (BP) at least once a week at home. If you do not have a blood pressure cuff at home, one will be provided for you. Contact your provider if you have not received your monitor within 1 week.   Helpful Tips for Accurate Home Blood Pressure Checks  Don't smoke, exercise, or drink caffeine 30 minutes before checking your BP Use the restroom before checking your BP (a full bladder can raise your pressure) Relax in a comfortable upright chair Feet on the ground Left arm resting comfortably on a flat surface at the level of your heart Legs uncrossed Back supported Sit quietly and don't talk Place the cuff on your bare arm Adjust snuggly, so that only two fingertips can fit between your skin and the top of the cuff Check 2 readings separated by at least one minute Keep a log of your BP readings For a visual, please reference this diagram: http://ccnc.care/bpdiagram  Provider Name: [facility]     Phone: [phone]  Zone 1: ALL CLEAR  Continue to monitor your symptoms:  BP reading is less than 140 (top number) or less than 90 (bottom number)  No right upper stomach pain No headaches or seeing spots No feeling nauseated or throwing up No swelling in face and hands  Zone 2: CAUTION Call your doctor's office for any of the following:  BP reading is greater than 140 (top number) or greater than  90 (bottom number)  Stomach pain under your ribs in the middle or right side Headaches or seeing spots Feeling nauseated or throwing up Swelling in face and hands  Zone 3: EMERGENCY  Seek immediate medical care if you have any of the following:  BP reading is greater than160 (top number) or greater than 110 (bottom number) Severe headaches not improving with Tylenol Serious difficulty catching your breath Any worsening symptoms from  Zone 2     Second Trimester of Pregnancy The second trimester is from week 14 through week 27 (months 4 through 6). The second trimester is often a time when you feel your best. Your body has adjusted to being pregnant, and you begin to feel better physically. Usually, morning sickness has lessened or quit completely, you may have more energy, and you may have an increase in appetite. The second trimester is also a time when the fetus is growing rapidly. At the end of the sixth month, the fetus is about 9 inches long and weighs about 1 pounds. You will likely begin to feel the baby move (quickening) between 16 and 20 weeks of pregnancy. Body changes during your second trimester Your body continues to go through many changes during your second trimester. The changes vary from woman to woman. Your weight will continue to increase. You will notice your lower abdomen bulging out. You may begin to get stretch marks on your hips, abdomen, and breasts. You may develop headaches that can be relieved by medicines. The medicines should be approved by your health care provider. You may urinate more often because the fetus is pressing on your bladder. You may develop or continue to have heartburn as a result of your pregnancy. You may develop constipation because certain hormones are causing the muscles that push waste through your intestines to slow down. You may develop hemorrhoids or swollen, bulging veins (varicose veins). You may have back pain. This is caused by: Weight gain. Pregnancy hormones that are relaxing the joints in your pelvis. A shift in weight and the muscles that support your balance. Your breasts will continue to grow and they will continue to become tender. Your gums may bleed and may be sensitive to brushing and flossing. Dark spots or blotches (chloasma, mask of pregnancy) may develop on your face. This will likely fade after the baby is born. A dark line from your belly button to  the pubic area (linea nigra) may appear. This will likely fade after the baby is born. You may have changes in your hair. These can include thickening of your hair, rapid growth, and changes in texture. Some women also have hair loss during or after pregnancy, or hair that feels dry or thin. Your hair will most likely return to normal after your baby is born.  What to expect at prenatal visits During a routine prenatal visit: You will be weighed to make sure you and the fetus are growing normally. Your blood pressure will be taken. Your abdomen will be measured to track your baby's growth. The fetal heartbeat will be listened to. Any test results from the previous visit will be discussed.  Your health care provider may ask you: How you are feeling. If you are feeling the baby move. If you have had any abnormal symptoms, such as leaking fluid, bleeding, severe headaches, or abdominal cramping. If you are using any tobacco products, including cigarettes, chewing tobacco, and electronic cigarettes. If you have any questions.  Other tests that may be performed during   your second trimester include: Blood tests that check for: Low iron levels (anemia). High blood sugar that affects pregnant women (gestational diabetes) between 24 and 28 weeks. Rh antibodies. This is to check for a protein on red blood cells (Rh factor). Urine tests to check for infections, diabetes, or protein in the urine. An ultrasound to confirm the proper growth and development of the baby. An amniocentesis to check for possible genetic problems. Fetal screens for spina bifida and Down syndrome. HIV (human immunodeficiency virus) testing. Routine prenatal testing includes screening for HIV, unless you choose not to have this test.  Follow these instructions at home: Medicines Follow your health care provider's instructions regarding medicine use. Specific medicines may be either safe or unsafe to take during  pregnancy. Take a prenatal vitamin that contains at least 600 micrograms (mcg) of folic acid. If you develop constipation, try taking a stool softener if your health care provider approves. Eating and drinking Eat a balanced diet that includes fresh fruits and vegetables, whole grains, good sources of protein such as meat, eggs, or tofu, and low-fat dairy. Your health care provider will help you determine the amount of weight gain that is right for you. Avoid raw meat and uncooked cheese. These carry germs that can cause birth defects in the baby. If you have low calcium intake from food, talk to your health care provider about whether you should take a daily calcium supplement. Limit foods that are high in fat and processed sugars, such as fried and sweet foods. To prevent constipation: Drink enough fluid to keep your urine clear or pale yellow. Eat foods that are high in fiber, such as fresh fruits and vegetables, whole grains, and beans. Activity Exercise only as directed by your health care provider. Most women can continue their usual exercise routine during pregnancy. Try to exercise for 30 minutes at least 5 days a week. Stop exercising if you experience uterine contractions. Avoid heavy lifting, wear low heel shoes, and practice good posture. A sexual relationship may be continued unless your health care provider directs you otherwise. Relieving pain and discomfort Wear a good support bra to prevent discomfort from breast tenderness. Take warm sitz baths to soothe any pain or discomfort caused by hemorrhoids. Use hemorrhoid cream if your health care provider approves. Rest with your legs elevated if you have leg cramps or low back pain. If you develop varicose veins, wear support hose. Elevate your feet for 15 minutes, 3-4 times a day. Limit salt in your diet. Prenatal Care Write down your questions. Take them to your prenatal visits. Keep all your prenatal visits as told by your health  care provider. This is important. Safety Wear your seat belt at all times when driving. Make a list of emergency phone numbers, including numbers for family, friends, the hospital, and police and fire departments. General instructions Ask your health care provider for a referral to a local prenatal education class. Begin classes no later than the beginning of month 6 of your pregnancy. Ask for help if you have counseling or nutritional needs during pregnancy. Your health care provider can offer advice or refer you to specialists for help with various needs. Do not use hot tubs, steam rooms, or saunas. Do not douche or use tampons or scented sanitary pads. Do not cross your legs for long periods of time. Avoid cat litter boxes and soil used by cats. These carry germs that can cause birth defects in the baby and possibly loss of the   fetus by miscarriage or stillbirth. Avoid all smoking, herbs, alcohol, and unprescribed drugs. Chemicals in these products can affect the formation and growth of the baby. Do not use any products that contain nicotine or tobacco, such as cigarettes and e-cigarettes. If you need help quitting, ask your health care provider. Visit your dentist if you have not gone yet during your pregnancy. Use a soft toothbrush to brush your teeth and be gentle when you floss. Contact a health care provider if: You have dizziness. You have mild pelvic cramps, pelvic pressure, or nagging pain in the abdominal area. You have persistent nausea, vomiting, or diarrhea. You have a bad smelling vaginal discharge. You have pain when you urinate. Get help right away if: You have a fever. You are leaking fluid from your vagina. You have spotting or bleeding from your vagina. You have severe abdominal cramping or pain. You have rapid weight gain or weight loss. You have shortness of breath with chest pain. You notice sudden or extreme swelling of your face, hands, ankles, feet, or legs. You  have not felt your baby move in over an hour. You have severe headaches that do not go away when you take medicine. You have vision changes. Summary The second trimester is from week 14 through week 27 (months 4 through 6). It is also a time when the fetus is growing rapidly. Your body goes through many changes during pregnancy. The changes vary from woman to woman. Avoid all smoking, herbs, alcohol, and unprescribed drugs. These chemicals affect the formation and growth your baby. Do not use any tobacco products, such as cigarettes, chewing tobacco, and e-cigarettes. If you need help quitting, ask your health care provider. Contact your health care provider if you have any questions. Keep all prenatal visits as told by your health care provider. This is important. This information is not intended to replace advice given to you by your health care provider. Make sure you discuss any questions you have with your health care provider. Document Released: 03/16/2001 Document Revised: 08/28/2015 Document Reviewed: 05/23/2012 Elsevier Interactive Patient Education  2017 Elsevier Inc.   Colposcopy, Care After The following information offers guidance on how to care for yourself after your procedure. Your health care provider may also give you more specific instructions. If you have problems or questions, contact your health care provider. What can I expect after the procedure? If you had a colposcopy without a biopsy, you can expect to feel fine right away after your procedure. However, you may have some spotting of blood for a few days. You can return to your normal activities. If you had a colposcopy with a biopsy, it is common after the procedure to have: Soreness and mild pain. These may last for a few days. Mild vaginal bleeding or discharge that is dark-colored and grainy. This may last for a few days. The discharge may be caused by a liquid (solution) that was used during the procedure. You may  need to wear a sanitary pad during this time. Spotting of blood for at least 48 hours after the procedure. Follow these instructions at home: Medicines Take over-the-counter and prescription medicines only as told by your health care provider. Talk with your health care provider about what type of over-the-counter pain medicines and prescription medicines you can start to take again. It is especially important to talk with your health care provider if you take blood thinners. Activity Avoid using douche products, using tampons, and having sex for at least 3  days after the procedure or for as long as told by your health care provider. Return to your normal activities as told by your health care provider. Ask your health care provider what activities are safe for you. General instructions Ask your health care provider if you may take baths, swim, or use a hot tub. You may take showers. If you use birth control (contraception), continue to use it. Keep all follow-up visits. This is important. Contact a health care provider if: You have a fever or chills. You faint or feel light-headed. Get help right away if: You have heavy bleeding from your vagina or pass blood clots. Heavy bleeding is bleeding that soaks through a sanitary pad in less than 1 hour. You have vaginal discharge that is abnormal, is yellow in color, or smells bad. This could be a sign of infection. You have severe pain or cramps in your lower abdomen that do not go away with medicine. Summary If you had a colposcopy without a biopsy, you can expect to feel fine right away, but you may have some spotting of blood for a few days. You can return to your normal activities. If you had a colposcopy with a biopsy, it is common to have mild pain for a few days and spotting for 48 hours after the procedure. Avoid using douche products, using tampons, and having sex for at least 3 days after the procedure or for as long as told by your  health care provider. Get help right away if you have heavy bleeding, severe pain, or signs of infection. This information is not intended to replace advice given to you by your health care provider. Make sure you discuss any questions you have with your health care provider. Document Revised: 08/17/2020 Document Reviewed: 08/17/2020 Elsevier Patient Education  2022 [employer].

## 2021-04-02 LAB — INTEGRATED 2
AFP MoM: 2.18
Alpha-Fetoprotein: 38.6 ng/mL
Crown Rump Length: 77.1 mm
DIA MoM: 1.09
DIA Value: 114.8 pg/mL
Estriol, Unconjugated: 1.24 ng/mL
Gest. Age on Collection Date: 13.4 weeks
Gestational Age: 16.6 weeks
Maternal Age at EDD: 29.2 yr
Nuchal Translucency (NT): 1.8 mm
Nuchal Translucency MoM: 1
Number of Fetuses: 1
PAPP-A MoM: 1.56
PAPP-A Value: 800 ng/mL
Test Results:: NEGATIVE
Weight: 312 [lb_av]
Weight: 312 [lb_av]
hCG MoM: 2.42
hCG Value: 44.2 IU/mL
uE3 MoM: 1.5

## 2021-04-05 NOTE — L&D Delivery Note (Addendum)
Delivery Note At [phone] a viable female infant was delivered via SVD, presentation: OA. APGAR: 9, 9; weight pending.   Placenta status: spontaneously delivered intact with gentle cord traction. Fundus firm with massage and Pitocin.   Anesthesia: epidural Lacerations: none Suture used for repair: n/a Est. Blood Loss (mL): 840 Placenta to LD Complications none Cord ph n/a   Mom to postpartum. Baby to Couplet care / Skin to Skin.    Donette Larry, CNM 08/25/2021 6:52 AM   Called to bedside ~15 after delivery for bleeding, moderate amt of blood and clots expressed, fundus firm, Pitocin infusing, foley in place, VSS, Methergine ordered, total QBL 647  Called to bedside ~50 after delivery for more bleeding and clots, fundus firm, moderate blood and clots removed from cervix, TXA and Cytotec given, total QBL 840. Dr Jolayne Panther consulted and at bedside.

## 2021-04-20 ENCOUNTER — Other Ambulatory Visit: Payer: Self-pay | Admitting: Women's Health

## 2021-04-20 DIAGNOSIS — Z363 Encounter for antenatal screening for malformations: Secondary | ICD-10-CM

## 2021-04-21 ENCOUNTER — Other Ambulatory Visit: Payer: Self-pay

## 2021-04-21 ENCOUNTER — Encounter: Payer: Self-pay | Admitting: Women's Health

## 2021-04-21 ENCOUNTER — Ambulatory Visit (INDEPENDENT_AMBULATORY_CARE_PROVIDER_SITE_OTHER): Payer: Medicaid Other

## 2021-04-21 ENCOUNTER — Ambulatory Visit (INDEPENDENT_AMBULATORY_CARE_PROVIDER_SITE_OTHER): Payer: Medicaid Other | Admitting: Women's Health

## 2021-04-21 VITALS — BP 119/86 | HR 79 | Wt 314.0 lb

## 2021-04-21 DIAGNOSIS — Z363 Encounter for antenatal screening for malformations: Secondary | ICD-10-CM

## 2021-04-21 DIAGNOSIS — O285 Abnormal chromosomal and genetic finding on antenatal screening of mother: Secondary | ICD-10-CM

## 2021-04-21 DIAGNOSIS — O0992 Supervision of high risk pregnancy, unspecified, second trimester: Secondary | ICD-10-CM

## 2021-04-21 DIAGNOSIS — Z3A19 19 weeks gestation of pregnancy: Secondary | ICD-10-CM | POA: Diagnosis not present

## 2021-04-21 DIAGNOSIS — O10919 Unspecified pre-existing hypertension complicating pregnancy, unspecified trimester: Secondary | ICD-10-CM

## 2021-04-21 LAB — POCT URINALYSIS DIPSTICK OB
Blood, UA: NEGATIVE
Glucose, UA: NEGATIVE
Ketones, UA: NEGATIVE
Leukocytes, UA: NEGATIVE
Nitrite, UA: NEGATIVE
POC,PROTEIN,UA: NEGATIVE

## 2021-04-21 NOTE — Patient Instructions (Addendum)
Kylie Bailey, thank you for choosing our office today! We appreciate the opportunity to meet your healthcare needs. You may receive a short survey by mail, e-mail, or through [employer]. If you are happy with your care we would appreciate if you could take just a few minutes to complete the survey questions. We read all of your comments and take your feedback very seriously. Thank you again for choosing our office.  Center for [employer] Team at [facility] at [facility] ([address] [place], [place] [address]) Entrance C, located off of [address] Free 24/7 valet parking  Go to [email or web address] to register for FREE online childbirth classes  Call the office [phone]) or go to [employer] if: You begin to severe cramping Your water breaks.  Sometimes it is a big gush of fluid, sometimes it is just a trickle that keeps getting your panties wet or running down your legs You have vaginal bleeding.  It is normal to have a small amount of spotting if your cervix was checked.   [facility] Pediatricians/Family Doctors [facility]): [address] [place], [phone]           [facility]: [address], [phone]                [facility]): [address], [phone] (call to ask if accepting patients) [facility]: [address], [place], [phone]    [facility] Pediatricians/Family Doctors [facility]): [phone] [address], [phone] [facility]: [address] [place], [phone] [facility]: [address], [phone]  [facility] Doctors  [facility]): [phone] [facility]: [address] [phone]   [facility] Doctors [facility]: [address] [phone]  [facility] Doctors  [facility]: [phone], [phone]  Home Blood Pressure Monitoring for Patients   Your provider has recommended that you check your blood pressure (BP) at least once a week at home. If you do not have a blood pressure cuff at home, one will be provided for you. Contact your provider if you have not received your monitor within 1 week.   Helpful Tips for Accurate Home Blood Pressure Checks  Don't smoke, exercise, or drink caffeine 30 minutes before checking your BP Use the restroom before checking your BP (a full bladder can raise your pressure) Relax in a comfortable upright chair Feet on the ground Left arm resting comfortably on a flat surface at the level of your heart Legs uncrossed Back supported Sit quietly and don't talk Place the cuff on your bare arm Adjust snuggly, so that only two fingertips can fit between your skin and the top of the cuff Check 2 readings separated by at least one minute Keep a log of your BP readings For a visual, please reference this diagram: http://ccnc.care/bpdiagram  Provider Name: [facility]     Phone: [phone]  Zone 1: ALL CLEAR  Continue to monitor your symptoms:  BP reading is less than 140 (top number) or less than 90 (bottom number)  No right upper stomach pain No headaches or seeing spots No feeling nauseated or throwing up No swelling in face and hands  Zone 2: CAUTION Call your doctor's office for any of the following:  BP reading is greater than 140 (top number) or greater than  90 (bottom number)  Stomach pain under your ribs in the middle or right side Headaches or seeing spots Feeling nauseated or throwing up Swelling in face and hands  Zone 3: EMERGENCY  Seek immediate medical care if you have any of the following:  BP reading is greater than160 (top number) or greater than 110 (bottom number) Severe headaches not improving with Tylenol Serious difficulty catching your breath Any worsening symptoms from  Zone 2     Second Trimester of Pregnancy The second trimester is from week 14 through week 27 (months 4 through 6). The second trimester is often a time when you feel your best. Your body has adjusted to being pregnant, and you begin to feel better physically. Usually, morning sickness has lessened or quit completely, you may have more energy, and you may have an increase in appetite. The second trimester is also a time when the fetus is growing rapidly. At the end of the sixth month, the fetus is about 9 inches long and weighs about 1 pounds. You will likely begin to feel the baby move (quickening) between 16 and 20 weeks of pregnancy. Body changes during your second trimester Your body continues to go through many changes during your second trimester. The changes vary from woman to woman. Your weight will continue to increase. You will notice your lower abdomen bulging out. You may begin to get stretch marks on your hips, abdomen, and breasts. You may develop headaches that can be relieved by medicines. The medicines should be approved by your health care provider. You may urinate more often because the fetus is pressing on your bladder. You may develop or continue to have heartburn as a result of your pregnancy. You may develop constipation because certain hormones are causing the muscles that push waste through your intestines to slow down. You may develop hemorrhoids or swollen, bulging veins (varicose veins). You may have back pain. This is caused by: Weight gain. Pregnancy hormones that are relaxing the joints in your pelvis. A shift in weight and the muscles that support your balance. Your breasts will continue to grow and they will continue to become tender. Your gums may bleed and may be sensitive to brushing and flossing. Dark spots or blotches (chloasma, mask of pregnancy) may develop on your face. This will likely fade after the baby is born. A dark line from your belly button to  the pubic area (linea nigra) may appear. This will likely fade after the baby is born. You may have changes in your hair. These can include thickening of your hair, rapid growth, and changes in texture. Some women also have hair loss during or after pregnancy, or hair that feels dry or thin. Your hair will most likely return to normal after your baby is born.  What to expect at prenatal visits During a routine prenatal visit: You will be weighed to make sure you and the fetus are growing normally. Your blood pressure will be taken. Your abdomen will be measured to track your baby's growth. The fetal heartbeat will be listened to. Any test results from the previous visit will be discussed.  Your health care provider may ask you: How you are feeling. If you are feeling the baby move. If you have had any abnormal symptoms, such as leaking fluid, bleeding, severe headaches, or abdominal cramping. If you are using any tobacco products, including cigarettes, chewing tobacco, and electronic cigarettes. If you have any questions.  Other tests that may be performed during   your second trimester include: Blood tests that check for: Low iron levels (anemia). High blood sugar that affects pregnant women (gestational diabetes) between 24 and 28 weeks. Rh antibodies. This is to check for a protein on red blood cells (Rh factor). Urine tests to check for infections, diabetes, or protein in the urine. An ultrasound to confirm the proper growth and development of the baby. An amniocentesis to check for possible genetic problems. Fetal screens for spina bifida and Down syndrome. HIV (human immunodeficiency virus) testing. Routine prenatal testing includes screening for HIV, unless you choose not to have this test.  Follow these instructions at home: Medicines Follow your health care provider's instructions regarding medicine use. Specific medicines may be either safe or unsafe to take during  pregnancy. Take a prenatal vitamin that contains at least 600 micrograms (mcg) of folic acid. If you develop constipation, try taking a stool softener if your health care provider approves. Eating and drinking Eat a balanced diet that includes fresh fruits and vegetables, whole grains, good sources of protein such as meat, eggs, or tofu, and low-fat dairy. Your health care provider will help you determine the amount of weight gain that is right for you. Avoid raw meat and uncooked cheese. These carry germs that can cause birth defects in the baby. If you have low calcium intake from food, talk to your health care provider about whether you should take a daily calcium supplement. Limit foods that are high in fat and processed sugars, such as fried and sweet foods. To prevent constipation: Drink enough fluid to keep your urine clear or pale yellow. Eat foods that are high in fiber, such as fresh fruits and vegetables, whole grains, and beans. Activity Exercise only as directed by your health care provider. Most women can continue their usual exercise routine during pregnancy. Try to exercise for 30 minutes at least 5 days a week. Stop exercising if you experience uterine contractions. Avoid heavy lifting, wear low heel shoes, and practice good posture. A sexual relationship may be continued unless your health care provider directs you otherwise. Relieving pain and discomfort Wear a good support bra to prevent discomfort from breast tenderness. Take warm sitz baths to soothe any pain or discomfort caused by hemorrhoids. Use hemorrhoid cream if your health care provider approves. Rest with your legs elevated if you have leg cramps or low back pain. If you develop varicose veins, wear support hose. Elevate your feet for 15 minutes, 3-4 times a day. Limit salt in your diet. Prenatal Care Write down your questions. Take them to your prenatal visits. Keep all your prenatal visits as told by your health  care provider. This is important. Safety Wear your seat belt at all times when driving. Make a list of emergency phone numbers, including numbers for family, friends, the hospital, and police and fire departments. General instructions Ask your health care provider for a referral to a local prenatal education class. Begin classes no later than the beginning of month 6 of your pregnancy. Ask for help if you have counseling or nutritional needs during pregnancy. Your health care provider can offer advice or refer you to specialists for help with various needs. Do not use hot tubs, steam rooms, or saunas. Do not douche or use tampons or scented sanitary pads. Do not cross your legs for long periods of time. Avoid cat litter boxes and soil used by cats. These carry germs that can cause birth defects in the baby and possibly loss of the   fetus by miscarriage or stillbirth. Avoid all smoking, herbs, alcohol, and unprescribed drugs. Chemicals in these products can affect the formation and growth of the baby. Do not use any products that contain nicotine or tobacco, such as cigarettes and e-cigarettes. If you need help quitting, ask your health care provider. Visit your dentist if you have not gone yet during your pregnancy. Use a soft toothbrush to brush your teeth and be gentle when you floss. Contact a health care provider if: You have dizziness. You have mild pelvic cramps, pelvic pressure, or nagging pain in the abdominal area. You have persistent nausea, vomiting, or diarrhea. You have a bad smelling vaginal discharge. You have pain when you urinate. Get help right away if: You have a fever. You are leaking fluid from your vagina. You have spotting or bleeding from your vagina. You have severe abdominal cramping or pain. You have rapid weight gain or weight loss. You have shortness of breath with chest pain. You notice sudden or extreme swelling of your face, hands, ankles, feet, or legs. You  have not felt your baby move in over an hour. You have severe headaches that do not go away when you take medicine. You have vision changes. Summary The second trimester is from week 14 through week 27 (months 4 through 6). It is also a time when the fetus is growing rapidly. Your body goes through many changes during pregnancy. The changes vary from woman to woman. Avoid all smoking, herbs, alcohol, and unprescribed drugs. These chemicals affect the formation and growth your baby. Do not use any tobacco products, such as cigarettes, chewing tobacco, and e-cigarettes. If you need help quitting, ask your health care provider. Contact your health care provider if you have any questions. Keep all prenatal visits as told by your health care provider. This is important. This information is not intended to replace advice given to you by your health care provider. Make sure you discuss any questions you have with your health care provider. Document Released: 03/16/2001 Document Revised: 08/28/2015 Document Reviewed: 05/23/2012 Elsevier Interactive Patient Education  2017 Elsevier Inc.   For Headaches:  Stay well hydrated, drink enough water so that your urine is clear, sometimes if you are dehydrated you can get headaches Eat small frequent meals and snacks, sometimes if you are hungry you can get headaches Sometimes you get headaches during pregnancy from the pregnancy hormones You can try tylenol (1-2 regular strength 325mg  or 1-2 extra strength 500mg ) as directed on the box. The least amount of medication that works is best.  Cool compresses (cool wet washcloth or ice pack) to area of head that is hurting You can also try drinking a caffeinated drink to see if this will help If not helping, try below:  For Prevention of Headaches/Migraines: CoQ10 100mg  three times daily Vitamin B2 400mg  daily Magnesium Oxide 400-600mg  daily  Foods to alleviate migraines:  1) dark leafy greens 2) avocado 3)  tuna 4) salmon  5) beans and legumes  Foods to avoid: 1) Excessive (or irregular timing) coffee 3) aged cheeses 4) chocolate 5) citrus fruits 6) aspartame and other artifical sweeteners 7) yeast 8) MSG (in processed foods) 9) processed and cured meats 10) nuts and certain seeds 11) chicken livers and other organ meats 12) dairy products like buttermilk, sour cream, and yogurt 13) dried fruits like dates, figs, and raisins 14) garlic 15) onions 16) potato chips 17) pickled foods like olives and sauerkraut 18) some fresh fruits like ripe banana,  papaya, red plums, raspberries, kiwi, pineapple 19) tomato-based products  Recommend to keep a migraine diary: rate daily the severity of your headache (1-10) and what foods you eat that day to help determine patterns.   If You Get a Bad Headache/Migraine: Benadryl 25mg   Magnesium Oxide 1 large Gatorade 2 extra strength Tylenol (1,000mg  total) 1 cup coffee or Coke      If this doesn't help please call @ [phone]

## 2021-04-21 NOTE — Progress Notes (Signed)
HIGH-RISK PREGNANCY VISIT Patient name: Kylie Bailey MRN [id number]  Date of birth: 14-Apr-1990 Chief Complaint:   High Risk Gestation ([place] today; + migraines almost everyday; pain in right leg)  History of Present Illness:   Kylie Bailey is a 31 y.o. [phone] female at [redacted]w[redacted]d with an Estimated Date of Delivery: 09/11/21 being seen today for ongoing management of a high-risk pregnancy complicated by chronic hypertension currently on labetalol 200mg  BID.    Today she reports  headaches, having to take apap frequently . Contractions: Not present. Vag. Bleeding: None.  Movement: Present. denies leaking of fluid.   Depression screen [facility] 2/9 03/19/2021 03/09/2021 08/15/2020 06/23/2020 05/19/2020  Decreased Interest 0 1 0 0 1  Down, Depressed, Hopeless 0 0 0 0 1  PHQ - 2 Score 0 1 0 0 2  Altered sleeping 0 0 - - 0  Tired, decreased energy 0 1 - - 1  Change in appetite 0 1 - - 1  Feeling bad or failure about yourself  0 0 - - 1  Trouble concentrating 0 0 - - 0  Moving slowly or fidgety/restless 0 0 - - 0  Suicidal thoughts 0 0 - - 0  PHQ-9 Score 0 3 - - 5  Difficult doing work/chores - - - - -  Some recent data might be hidden     GAD 7 : Generalized Anxiety Score 03/09/2021 08/15/2020 04/02/2020 04/02/2020  Nervous, Anxious, on Edge 0 1 1 1   Control/stop worrying 0 1 - 1  Worry too much - different things 0 3 - 1  Trouble relaxing 1 0 - 1  Restless 1 1 - 1  Easily annoyed or irritable 0 3 - 1  Afraid - awful might happen 0 0 - 1  Total GAD 7 Score 2 9 - 7  Anxiety Difficulty - - - -     Review of Systems:   Pertinent items are noted in HPI Denies abnormal vaginal discharge w/ itching/odor/irritation, headaches, visual changes, shortness of breath, chest pain, abdominal pain, severe nausea/vomiting, or problems with urination or bowel movements unless otherwise stated above. Pertinent History Reviewed:  Reviewed past medical,surgical, social, obstetrical and family history.   Reviewed problem list, medications and allergies. Physical Assessment:   Vitals:   04/21/21 1604  BP: 119/86  Pulse: 79  Weight: (!) 314 lb (142.4 kg)  Body mass index is 45.05 kg/m.           Physical Examination:   General appearance: alert, well appearing, and in no distress  Mental status: alert, oriented to person, place, and time  Skin: warm & dry   Extremities: Edema: None    Cardiovascular: normal heart rate noted  Respiratory: normal respiratory effort, no distress  Abdomen: gravid, soft, non-tender  Pelvic: Cervical exam deferred         Fetal Status: Fetal Heart Rate (bpm): 144 u/s   Movement: Present    Fetal Surveillance Testing today: [place] 0000000 wks,cephalic/transverse,posterior placenta gr 0,normal ovaries,cx 3.2 cm,svp of fluid 4 cm,fhr 144 bpm,EFW 315 g 58%,anatomy complete,no obvious abnormalities   Chaperone: N/A    Results for orders placed or performed in visit on 04/21/21 (from the past 24 hour(s))  POC Urinalysis Dipstick OB   Collection Time: 04/21/21  4:06 PM  Result Value Ref Range   Color, UA     Clarity, UA     Glucose, UA Negative Negative   Bilirubin, UA     Ketones, UA neg  Spec Grav, UA     Blood, UA neg    pH, UA     POC,PROTEIN,UA Negative Negative, Trace, Small (1+), Moderate (2+), Large (3+), 4+   Urobilinogen, UA     Nitrite, UA neg    Leukocytes, UA Negative Negative   Appearance     Odor      Assessment & Plan:  High-risk pregnancy: [id number] at [redacted]w[redacted]d with an Estimated Date of Delivery: 09/11/21   1) CHTN, stable on labetalol 200mg  BID, ASA  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   Labs/procedures today: U/S  Treatment Plan:     Growth u/s q 4wks    2x/wk testing nst/sono @ 32wks     Deliver 38-39wks (37wks or prn if poor control)____   Reviewed: Preterm  labor symptoms and general obstetric precautions including but not limited to vaginal bleeding, contractions, leaking of fluid and fetal movement were reviewed in detail with the patient.  All questions were answered.   Follow-up: Return in about 4 weeks (around 05/19/2021) for [place], [place]:EFW, MD or CNM, in person.   Future Appointments  Date Time Provider [place]  05/25/2021 11:15 AM [facility] [place] [facility] None  03/19/2022  8:00 AM [place], Dewaine Conger, FNP [facility]    Orders Placed This Encounter  Procedures   POC Urinalysis Dipstick OB   Roma Schanz CNM, [facility] 04/21/2021 4:30 PM

## 2021-04-21 NOTE — Progress Notes (Signed)
[place] 19+4 wks,cephalic/transverse,posterior placenta gr 0,normal ovaries,cx 3.2 cm,svp of fluid 4 cm,fhr 144 bpm,EFW 315 g 58%,anatomy complete,no obvious abnormalities

## 2021-05-05 ENCOUNTER — Ambulatory Visit (INDEPENDENT_AMBULATORY_CARE_PROVIDER_SITE_OTHER): Payer: Medicaid Other | Admitting: *Deleted

## 2021-05-05 ENCOUNTER — Telehealth: Payer: Self-pay | Admitting: Women's Health

## 2021-05-05 ENCOUNTER — Other Ambulatory Visit: Payer: Self-pay

## 2021-05-05 VITALS — BP 139/82 | HR 88

## 2021-05-05 DIAGNOSIS — O10919 Unspecified pre-existing hypertension complicating pregnancy, unspecified trimester: Secondary | ICD-10-CM

## 2021-05-05 DIAGNOSIS — O0992 Supervision of high risk pregnancy, unspecified, second trimester: Secondary | ICD-10-CM

## 2021-05-05 NOTE — Telephone Encounter (Signed)
Patient states she normally feels the baby move several times during the day but has not for the last 2 days.  She has tried laying on her side, eating sweets and pushing on her abdomen but still not feeling anything.  Advised patient to come into office for FHR check. Pt agreeable to come.

## 2021-05-05 NOTE — Telephone Encounter (Signed)
Patient called stating that she is [redacted] weeks pregnant, patient states that her baby is pretty active and she could feel the baby move she states that lately the baby has been move less and less. Patient also states she has been experiencing some sharp pain in her abdominal area. Please contact pt

## 2021-05-05 NOTE — Progress Notes (Signed)
° °  NURSE VISIT- Fetal Heart Rate Check  SUBJECTIVE:  Kylie Bailey is a 31 y.o. [phone] female at [redacted]w[redacted]d, here for a fetal heart rate check for no fetal movement for 2 days.  Wants to just make sure baby is fine.  OBJECTIVE:  LMP 12/05/2020 (Exact Date)   Appears well, no apparent distress  FHR 156 via doppler   ASSESSMENT: [id number] at [redacted]w[redacted]d with present fetal heart rate Patient felt movement during doppler Discussed kick counts to begin at 28 weeks  PLAN: Follow-up as scheduled  Jobe Marker  05/05/2021 4:25 PM

## 2021-05-22 ENCOUNTER — Other Ambulatory Visit: Payer: Self-pay | Admitting: Women's Health

## 2021-05-22 DIAGNOSIS — O10919 Unspecified pre-existing hypertension complicating pregnancy, unspecified trimester: Secondary | ICD-10-CM

## 2021-05-25 ENCOUNTER — Encounter: Payer: Self-pay | Admitting: Obstetrics & Gynecology

## 2021-05-25 ENCOUNTER — Other Ambulatory Visit: Payer: Self-pay

## 2021-05-25 ENCOUNTER — Ambulatory Visit (INDEPENDENT_AMBULATORY_CARE_PROVIDER_SITE_OTHER): Payer: Medicaid Other

## 2021-05-25 ENCOUNTER — Ambulatory Visit (INDEPENDENT_AMBULATORY_CARE_PROVIDER_SITE_OTHER): Payer: Medicaid Other | Admitting: Obstetrics & Gynecology

## 2021-05-25 VITALS — BP 151/79 | HR 87 | Wt 311.0 lb

## 2021-05-25 DIAGNOSIS — O10919 Unspecified pre-existing hypertension complicating pregnancy, unspecified trimester: Secondary | ICD-10-CM

## 2021-05-25 DIAGNOSIS — O0992 Supervision of high risk pregnancy, unspecified, second trimester: Secondary | ICD-10-CM

## 2021-05-25 DIAGNOSIS — Z6841 Body Mass Index (BMI) 40.0 and over, adult: Secondary | ICD-10-CM

## 2021-05-25 DIAGNOSIS — F419 Anxiety disorder, unspecified: Secondary | ICD-10-CM

## 2021-05-25 MED ORDER — HYDROXYZINE PAMOATE 50 MG PO CAPS: 50.0000 mg | ORAL_CAPSULE | Freq: Three times a day (TID) | ORAL | 1 refills | Status: DC | PRN

## 2021-05-25 NOTE — Progress Notes (Signed)
[place] 24+3 wks,breech,posterior placenta gr 0,CX 4.2 cm,SVP of fluid 4.6 cm,FHR 164 bpm,EFW 777 g 73%

## 2021-05-25 NOTE — Progress Notes (Signed)
HIGH-RISK PREGNANCY VISIT Patient name: Kylie Bailey MRN [id number]  Date of birth: [birth year] Chief Complaint:   Routine Prenatal Visit  History of Present Illness:   Kylie Bailey is a 31 y.o. [id number] female at [redacted]w[redacted]d with an Estimated Date of Delivery: 09/11/21 being seen today for ongoing management of a high-risk pregnancy complicated by:  -chronic HTN- on Labetalol 200mg  bid -Anxiety- would prefer to avoid medication during pregnancy. In the process of finding therapist  Contractions: Not present. Vag. Bleeding: None.  Movement: Present. denies leaking of fluid.   Depression screen [facility] 2/9 03/19/2021 03/09/2021 08/15/2020 06/23/2020 05/19/2020  Decreased Interest 0 1 0 0 1  Down, Depressed, Hopeless 0 0 0 0 1  PHQ - 2 Score 0 1 0 0 2  Altered sleeping 0 0 - - 0  Tired, decreased energy 0 1 - - 1  Change in appetite 0 1 - - 1  Feeling bad or failure about yourself  0 0 - - 1  Trouble concentrating 0 0 - - 0  Moving slowly or fidgety/restless 0 0 - - 0  Suicidal thoughts 0 0 - - 0  PHQ-9 Score 0 3 - - 5  Difficult doing work/chores - - - - -  Some recent data might be hidden     Current Outpatient Medications  Medication Instructions   acetaminophen (TYLENOL) 500 mg, Oral, Every 6 hours PRN   albuterol (VENTOLIN HFA) 108 (90 Base) MCG/ACT inhaler 1-2 puffs, Inhalation, Every 6 hours PRN   aspirin 162 mg, Oral, Daily, Swallow whole.   Blood Pressure Monitor MISC For regular home bp monitoring during pregnancy   calcium carbonate (TUMS - DOSED IN MG ELEMENTAL CALCIUM) 500 MG chewable tablet 2 tablets, 2 times daily PRN   hydrOXYzine (VISTARIL) 50 mg, Oral, 3 times daily PRN   labetalol (NORMODYNE) 200 mg, Oral, 2 times daily   Prenatal MV & Min w/FA-DHA (PRENATAL ADULT GUMMY/DHA/FA) 0.4-25 MG CHEW 1 tablet, Oral, Daily     Review of Systems:   Pertinent items are noted in HPI Denies abnormal vaginal discharge w/ itching/odor/irritation, headaches, visual  changes, shortness of breath, chest pain, abdominal pain, severe nausea/vomiting, or problems with urination or bowel movements unless otherwise stated above. Pertinent History Reviewed:  Reviewed past medical,surgical, social, obstetrical and family history.  Reviewed problem list, medications and allergies. Physical Assessment:   Vitals:   05/25/21 1149  BP: (!) 151/79  Pulse: 87  Weight: (!) 311 lb (141.1 kg)  Body mass index is 44.62 kg/m.           Physical Examination:   General appearance: alert, well appearing, and in no distress  Mental status: normal mood, behavior, speech, dress, motor activity, and thought processes  Skin: warm & dry   Extremities: Edema: None    Cardiovascular: normal heart rate noted  Respiratory: normal respiratory effort, no distress  Abdomen: gravid, soft, non-tender  Pelvic: Cervical exam deferred         Fetal Status:     Movement: Present    Fetal Surveillance Testing today: [place] growth- ,breech,posterior placenta gr 0,CX 4.2 cm,SVP of fluid 4.6 cm,FHR 164 bpm,EFW 777 g 73%   Chaperone: N/A    No results found for this or any previous visit (from the past 24 hour(s)).   Assessment & Plan:  High-risk pregnancy: [id number] at [redacted]w[redacted]d with an Estimated Date of Delivery: 09/11/21   1) Chronic HTN Continue Labetalol 200mg  bid Office cuff given today -Growth every  4wks -@ 32wks twice weekly testing  2) Anxiety- encouraged pt to work with therapist Agreeable to vistaril prn  Meds:  Meds ordered this encounter  Medications   hydrOXYzine (VISTARIL) 50 MG capsule    Sig: Take 1 capsule (50 mg total) by mouth 3 (three) times daily as needed for anxiety.    Dispense:  30 capsule    Refill:  1    Labs/procedures today: growth scan  Treatment Plan:  as outlined above, PN-2 next visit  Reviewed: Preterm labor symptoms and general obstetric precautions including but not limited to vaginal bleeding, contractions, leaking of fluid and fetal movement  were reviewed in detail with the patient.  All questions were answered. Pt given home bp cuff. Check bp weekly, let us know if >140/90.   Follow-up: Return in about 4 weeks (around 06/22/2021) for [place] visit, PN-2 and growth .   Future Appointments  Date Time Provider [place]  06/23/2021  8:30 AM [facility]-FTOBGYN LAB [facility]TO[facility]YN  06/23/2021  9:15 AM [facility] - FTOBGYN [place] C[facility]-FTIMG None  06/23/2021 10:30 AM Roma Schanz, CNM [facility]-FT FTOBGYN  03/19/2022  8:00 AM Paseda, Dewaine Conger, FNP RPC-RPC RPC    No orders of the defined types were placed in this encounter.   Janyth Pupa, DO Attending [place], [facility] for [employer], [place]

## 2021-06-11 ENCOUNTER — Telehealth: Payer: Self-pay | Admitting: Women's Health

## 2021-06-11 NOTE — Telephone Encounter (Signed)
Patient states that she has a knot in her lower left leg and it's bruised. She says she doesn't remember hitting her leg on anything in the last week. She has had leg cramps in this pregnancy. Please advise.  ?

## 2021-06-22 ENCOUNTER — Other Ambulatory Visit: Payer: Self-pay | Admitting: Obstetrics & Gynecology

## 2021-06-22 DIAGNOSIS — O10919 Unspecified pre-existing hypertension complicating pregnancy, unspecified trimester: Secondary | ICD-10-CM

## 2021-06-23 ENCOUNTER — Encounter: Payer: Self-pay | Admitting: Women's Health

## 2021-06-23 ENCOUNTER — Ambulatory Visit (INDEPENDENT_AMBULATORY_CARE_PROVIDER_SITE_OTHER): Payer: Medicaid Other

## 2021-06-23 ENCOUNTER — Ambulatory Visit (INDEPENDENT_AMBULATORY_CARE_PROVIDER_SITE_OTHER): Payer: Medicaid Other | Admitting: Women's Health

## 2021-06-23 ENCOUNTER — Other Ambulatory Visit: Payer: Self-pay

## 2021-06-23 ENCOUNTER — Other Ambulatory Visit: Payer: Medicaid Other

## 2021-06-23 VITALS — BP 131/83 | HR 89 | Wt 317.0 lb

## 2021-06-23 DIAGNOSIS — O0992 Supervision of high risk pregnancy, unspecified, second trimester: Secondary | ICD-10-CM

## 2021-06-23 DIAGNOSIS — Z3A28 28 weeks gestation of pregnancy: Secondary | ICD-10-CM

## 2021-06-23 DIAGNOSIS — Z23 Encounter for immunization: Secondary | ICD-10-CM

## 2021-06-23 DIAGNOSIS — Z131 Encounter for screening for diabetes mellitus: Secondary | ICD-10-CM | POA: Diagnosis not present

## 2021-06-23 DIAGNOSIS — O0993 Supervision of high risk pregnancy, unspecified, third trimester: Secondary | ICD-10-CM | POA: Diagnosis not present

## 2021-06-23 DIAGNOSIS — O10919 Unspecified pre-existing hypertension complicating pregnancy, unspecified trimester: Secondary | ICD-10-CM

## 2021-06-23 LAB — POCT URINALYSIS DIPSTICK OB
Ketones, UA: NEGATIVE
Leukocytes, UA: NEGATIVE
Nitrite, UA: NEGATIVE
POC,PROTEIN,UA: NEGATIVE

## 2021-06-23 NOTE — Progress Notes (Signed)
[place] 28+4 wks,breech,posterior placenta gr 0,normal ovaries,AFI 12.5 cm,EFW 1388 g 68%,fhr 146 BPM ?

## 2021-06-23 NOTE — Progress Notes (Signed)
? ?HIGH-RISK PREGNANCY VISIT ?Patient name: Kylie Bailey MRN [id number]  Date of birth: 1991/03/25 ?Chief Complaint:   ?High Risk Gestation (PN2 & [place] today; having vaginal pain) ? ?History of Present Illness:   ?Kylie Bailey is a 31 y.o. [phone] female at [redacted]w[redacted]d with an Estimated Date of Delivery: 09/11/21 being seen today for ongoing management of a high-risk pregnancy complicated by chronic hypertension currently on labetalol 200mg  BID.   ? ?Today she reports  occ feels like baby is scratching at cervix. Wants BTL . Contractions: Not present. Vag. Bleeding: None.  Movement: Present. denies leaking of fluid.  ? ?Depression screen [facility] 2/9 06/23/2021 03/19/2021 03/09/2021 08/15/2020 06/23/2020  ?Decreased Interest 0 0 1 0 0  ?Down, Depressed, Hopeless 0 0 0 0 0  ?PHQ - 2 Score 0 0 1 0 0  ?Altered sleeping 0 0 0 - -  ?Tired, decreased energy 0 0 1 - -  ?Change in appetite 0 0 1 - -  ?Feeling bad or failure about yourself  0 0 0 - -  ?Trouble concentrating 0 0 0 - -  ?Moving slowly or fidgety/restless 0 0 0 - -  ?Suicidal thoughts 0 0 0 - -  ?PHQ-9 Score 0 0 3 - -  ?Difficult doing work/chores - - - - -  ?Some recent data might be hidden  ? ?  ?GAD 7 : Generalized Anxiety Score 06/23/2021 03/09/2021 08/15/2020 04/02/2020  ?Nervous, Anxious, on Edge 0 0 1 1  ?Control/stop worrying 1 0 1 -  ?Worry too much - different things 1 0 3 -  ?Trouble relaxing 0 1 0 -  ?Restless 0 1 1 -  ?Easily annoyed or irritable 0 0 3 -  ?Afraid - awful might happen 0 0 0 -  ?Total GAD 7 Score 2 2 9  -  ?Anxiety Difficulty - - - -  ? ? ? ?Review of Systems:   ?Pertinent items are noted in HPI ?Denies abnormal vaginal discharge w/ itching/odor/irritation, headaches, visual changes, shortness of breath, chest pain, abdominal pain, severe nausea/vomiting, or problems with urination or bowel movements unless otherwise stated above. ?Pertinent History Reviewed:  ?Reviewed past medical,surgical, social, obstetrical and family history.  ?Reviewed  problem list, medications and allergies. ?Physical Assessment:  ? ?Vitals:  ? 06/23/21 1009  ?BP: 131/83  ?Pulse: 89  ?Weight: (!) 317 lb (143.8 kg)  ?Body mass index is 45.48 kg/m?. ?     ?     Physical Examination:  ? General appearance: alert, well appearing, and in no distress ? Mental status: alert, oriented to person, place, and time ? Skin: warm & dry  ? Extremities: Edema: Trace  ?  Cardiovascular: normal heart rate noted ? Respiratory: normal respiratory effort, no distress ? Abdomen: gravid, soft, non-tender ? Pelvic: Cervical exam deferred        ? ?Fetal Status: Fetal Heart Rate (bpm): 146 u/s   Movement: Present   ? ?Fetal Surveillance Testing today: [place] 28+4 wks,breech,posterior placenta gr 0,normal ovaries,AFI 12.5 cm,EFW 1388 g 68%,fhr 146 BPM ? ?Chaperone: N/A   ? ?Results for orders placed or performed in visit on 06/23/21 (from the past 24 hour(s))  ?POC Urinalysis Dipstick OB  ? Collection Time: 06/23/21 10:14 AM  ?Result Value Ref Range  ? Color, UA    ? Clarity, UA    ? Glucose, UA 4+ (A) Negative  ? Bilirubin, UA    ? Ketones, UA neg   ? Spec Grav, UA    ?  Blood, UA trace   ? pH, UA    ? POC,PROTEIN,UA Negative Negative, Trace, Small (1+), Moderate (2+), Large (3+), 4+  ? Urobilinogen, UA    ? Nitrite, UA neg   ? Leukocytes, UA Negative Negative  ? Appearance    ? Odor    ?  ?Assessment & Plan:  ?High-risk pregnancy: [id number] at [redacted]w[redacted]d with an Estimated Date of Delivery: 09/11/21  ? ?1) CHTN, stable on labetalol 200mg  BID, ASA ? ?2) Wants BTL>reviewed risks/benefits, discussed high incidence regret <30yo if appropriate, LARCs just as effective, consent signed today ? ?Meds: No orders of the defined types were placed in this encounter. ? ? ?Labs/procedures today: tdap, U/S, and PN2 ? ?Treatment Plan:   Growth u/s q 4wks    2x/wk testing nst/sono @ 32wks     Deliver 38-39wks (37wks or prn if poor control)____  ? ?Reviewed: Preterm labor symptoms and general obstetric precautions including but not  limited to vaginal bleeding, contractions, leaking of fluid and fetal movement were reviewed in detail with the patient.  All questions were answered. Does have home bp cuff. Office bp cuff given: not applicable. Check bp weekly, let us know if consistently >150 and/or >95. ? ?Follow-up: Return in about 4 weeks (around 07/21/2021) for Tues HROB, [id number], MD or CNM, in person; Fri NST/nurse til 39wks; sign BTL. ? ? ?Future Appointments  ?Date Time Provider [place]  ?07/21/2021  2:15 PM [place] - FTOBGYN [place] C[facility]-FTIMG None  ?07/21/2021  3:10 PM Janyth Pupa, DO C[facility]-FT FTOBGYN  ?03/19/2022  8:00 AM Paseda, Dewaine Conger, FNP RPC-RPC RPC  ? ? ?Orders Placed This Encounter  ?Procedures  ? US OB Follow Up  ? US FETAL BPP WO NON STRESS  ? [place] UA Cord Doppler  ? POC Urinalysis Dipstick OB  ? ?[place], [place] ?06/23/2021 ?10:44 AM  ?

## 2021-06-23 NOTE — Patient Instructions (Signed)
Kylie Bailey, thank you for choosing our office today! We appreciate the opportunity to meet your healthcare needs. You may receive a short survey by mail, e-mail, or through [employer]. If you are happy with your care we would appreciate if you could take just a few minutes to complete the survey questions. We read all of your comments and take your feedback very seriously. Thank you again for choosing our office.  ?Center for [employer] Team at [facility] ? ?[facility]'s [facility] at [facility] ?([address] [place], [place] [address]) ?Entrance C, located off of [address] ?Free 24/7 valet parking  ? ?CLASSES: Go to [email or web address] to register for classes (childbirth, breastfeeding, waterbirth, infant CPR, daddy bootcamp, etc.) ? ?Call the office [phone]) or go to [facility] if: ?You begin to have strong, frequent contractions ?Your water breaks.  Sometimes it is a big gush of fluid, sometimes it is just a trickle that keeps getting your panties wet or running down your legs ?You have vaginal bleeding.  It is normal to have a small amount of spotting if your cervix was checked.  ?You don't feel your baby moving like normal.  If you don't, get you something to eat and drink and lay down and focus on feeling your baby move.   If your baby is still not moving like normal, you should call the office or go to [facility]. ? ?Call the office [phone]) or go to [facility] hospital for these signs of pre-eclampsia: ?Severe headache that does not go away with Tylenol ?Visual changes- seeing spots, double, blurred vision ?Pain under your right breast or upper abdomen that does not go away with Tums or heartburn medicine ?Nausea and/or vomiting ?Severe swelling in your hands, feet, and face  ? ?Tdap Vaccine ?It is recommended that you get the Tdap vaccine during the third trimester of EACH pregnancy to help protect your baby from getting pertussis (whooping cough) ?27-36 weeks is the BEST time to do  this so that you can pass the protection on to your baby. During pregnancy is better than after pregnancy, but if you are unable to get it during pregnancy it will be offered at the hospital.  ?You can get this vaccine with [place], at the health department, your family doctor, or some local pharmacies ?Everyone who will be around your baby should also be up-to-date on their vaccines before the baby comes. Adults (who are not pregnant) only need 1 dose of Tdap during adulthood.  ? ?[place] Pediatricians/Family Doctors ?[facility]): [address], [phone]           ?[facility]: [address], [phone]                ?[facility]): [address], [phone] (call to ask if accepting patients) ?[facility]: [address], [place], [phone]   ? ?[place] Pediatricians/Family Doctors ?[facility]): [address], [phone] ?[facility]: [address] [place], [phone] ?[facility] [place]: [address], [phone] ? ?[employer] Family Doctors  ?[facility]): [phone] ?[facility]: [address], [phone]  ? ?[facility] Family Doctors ?[facility]: [address], [phone] ? ?[facility] Family Doctors  ?[facility]: [phone], [phone] ? ?Home Blood Pressure Monitoring for Patients  ? ?Your provider has recommended that you check your  blood pressure (BP) at least once a week at home. If you do not have a blood pressure cuff at home, one will be provided for you. Contact your provider if you have not received your monitor within 1 week.  ? ?Helpful Tips for Accurate Home Blood Pressure Checks  ?Don't smoke, exercise, or drink caffeine 30 minutes before checking your BP ?Use the restroom before checking your BP (a full bladder can raise your  pressure) ?Relax in a comfortable upright chair ?Feet on the ground ?Left arm resting comfortably on a flat surface at the level of your heart ?Legs uncrossed ?Back supported ?Sit quietly and don't talk ?Place the cuff on your bare arm ?Adjust snuggly, so that only two fingertips can fit between your skin and the top of the cuff ?Check 2 readings separated by at least one minute ?Keep a log of your BP readings ?For a visual, please reference this diagram: http://ccnc.care/bpdiagram ? ?Provider Name: [facility]     Phone: [phone] ? ?Zone 1: ALL CLEAR  ?Continue to monitor your symptoms:  ?BP reading is less than 140 (top number) or less than 90 (bottom number)  ?No right upper stomach pain ?No headaches or seeing spots ?No feeling nauseated or throwing up ?No swelling in face and hands ? ?Zone 2: CAUTION ?Call your doctor's office for any of the following:  ?BP reading is greater than 140 (top number) or greater than 90 (bottom number)  ?Stomach pain under your ribs in the middle or right side ?Headaches or seeing spots ?Feeling nauseated or throwing up ?Swelling in face and hands ? ?Zone 3: EMERGENCY  ?Seek immediate medical care if you have any of the following:  ?BP reading is greater than160 (top number) or greater than 110 (bottom number) ?Severe headaches not improving with Tylenol ?Serious difficulty catching your breath ?Any worsening symptoms from Zone 2  ? ?Third Trimester of Pregnancy ?The third trimester is from week 29 through week 42, months 7 through 9. The third trimester is a time when the fetus is growing rapidly. At the end of the ninth month, the fetus is about 20 inches in length and weighs 6-10 pounds.  ?BODY CHANGES ?Your body goes through many changes during pregnancy. The changes vary from woman to woman.  ?Your weight will continue to increase. You can expect to gain 25-35 pounds (11-16 kg) by the end of the pregnancy. ?You may begin to get stretch marks on your hips, abdomen,  and breasts. ?You may urinate more often because the fetus is moving lower into your pelvis and pressing on your bladder. ?You may develop or continue to have heartburn as a result of your pregnancy. ?You may develop constipation because certain hormones are causing the muscles that push waste through your intestines to slow down. ?You may develop hemorrhoids or swollen, bulging veins (varicose veins). ?You may have pelvic pain because of the weight gain and pregnancy hormones relaxing your joints between the bones in your pelvis. Backaches may result from overexertion of the muscles supporting your posture. ?You may have changes in your hair. These can include thickening of your hair, rapid growth, and changes in texture. Some women also have hair loss during or after pregnancy, or hair that feels dry or thin. Your hair will most likely return to normal after your baby is born. ?Your breasts will continue to grow and be tender. A yellow discharge may leak from your breasts called colostrum. ?Your belly button may stick out. ?You may  feel short of breath because of your expanding uterus. ?You may notice the fetus "dropping," or moving lower in your abdomen. ?You may have a bloody mucus discharge. This usually occurs a few days to a week before labor begins. ?Your cervix becomes thin and soft (effaced) near your due date. ?WHAT TO EXPECT AT [place]  ?You will have prenatal exams every 2 weeks until week 36. Then, you will have weekly prenatal exams. During a routine prenatal visit: ?You will be weighed to make sure you and the fetus are growing normally. ?Your blood pressure is taken. ?Your abdomen will be measured to track your baby's growth. ?The fetal heartbeat will be listened to. ?Any test results from the previous visit will be discussed. ?You may have a cervical check near your due date to see if you have effaced. ?At around 36 weeks, your caregiver will check your cervix. At the same time, your  caregiver will also perform a test on the secretions of the vaginal tissue. This test is to determine if a type of bacteria, Group B streptococcus, is present. Your caregiver will explain this further. ?You

## 2021-06-23 NOTE — Addendum Note (Signed)
Addended by: Linton Rump on: 06/23/2021 11:03 AM ? ? Modules accepted: Orders ? ?

## 2021-06-24 ENCOUNTER — Other Ambulatory Visit: Payer: Self-pay | Admitting: Women's Health

## 2021-06-24 LAB — CBC
Hematocrit: 37.6 % (ref 34.0–46.6)
Hemoglobin: 12.7 g/dL (ref 11.1–15.9)
MCH: 31.8 pg (ref 26.6–33.0)
MCHC: 33.8 g/dL (ref 31.5–35.7)
MCV: 94 fL (ref 79–97)
Platelets: 232 10*3/uL (ref 150–450)
RBC: 3.99 x10E6/uL (ref 3.77–5.28)
RDW: 12.9 % (ref 11.7–15.4)
WBC: 10.8 10*3/uL (ref 3.4–10.8)

## 2021-06-24 LAB — GLUCOSE TOLERANCE, 2 HOURS W/ 1HR
Glucose, 1 hour: 173 mg/dL (ref 70–179)
Glucose, 2 hour: 107 mg/dL (ref 70–152)
Glucose, Fasting: 90 mg/dL (ref 70–91)

## 2021-06-24 LAB — HIV ANTIBODY (ROUTINE TESTING W REFLEX): HIV Screen 4th Generation wRfx: NONREACTIVE

## 2021-06-24 LAB — ANTIBODY SCREEN: Antibody Screen: NEGATIVE

## 2021-06-24 LAB — RPR: RPR Ser Ql: NONREACTIVE

## 2021-06-29 ENCOUNTER — Encounter: Payer: Self-pay | Admitting: Women's Health

## 2021-06-29 ENCOUNTER — Other Ambulatory Visit: Payer: Self-pay | Admitting: Women's Health

## 2021-06-29 DIAGNOSIS — O0993 Supervision of high risk pregnancy, unspecified, third trimester: Secondary | ICD-10-CM

## 2021-06-29 DIAGNOSIS — R252 Cramp and spasm: Secondary | ICD-10-CM

## 2021-06-30 ENCOUNTER — Encounter: Payer: Self-pay | Admitting: Women's Health

## 2021-06-30 DIAGNOSIS — O0993 Supervision of high risk pregnancy, unspecified, third trimester: Secondary | ICD-10-CM | POA: Diagnosis not present

## 2021-06-30 DIAGNOSIS — O99891 Other specified diseases and conditions complicating pregnancy: Secondary | ICD-10-CM | POA: Diagnosis not present

## 2021-06-30 DIAGNOSIS — R252 Cramp and spasm: Secondary | ICD-10-CM | POA: Diagnosis not present

## 2021-07-01 LAB — COMPREHENSIVE METABOLIC PANEL
ALT: 14 IU/L (ref 0–32)
AST: 19 IU/L (ref 0–40)
Albumin/Globulin Ratio: 1.7 (ref 1.2–2.2)
Albumin: 3.6 g/dL — ABNORMAL LOW (ref 3.9–5.0)
Alkaline Phosphatase: 87 IU/L (ref 44–121)
BUN/Creatinine Ratio: 9 (ref 9–23)
BUN: 6 mg/dL (ref 6–20)
Bilirubin Total: <0.2 mg/dL (ref 0.0–1.2)
CO2: 18 mmol/L — ABNORMAL LOW (ref 20–29)
Calcium: 9.2 mg/dL (ref 8.7–10.2)
Chloride: 107 mmol/L — ABNORMAL HIGH (ref 96–106)
Creatinine, Ser: 0.69 mg/dL (ref 0.57–1.00)
Globulin, Total: 2.1 g/dL (ref 1.5–4.5)
Glucose: 109 mg/dL — ABNORMAL HIGH (ref 70–99)
Potassium: 4 mmol/L (ref 3.5–5.2)
Sodium: 140 mmol/L (ref 134–144)
Total Protein: 5.7 g/dL — ABNORMAL LOW (ref 6.0–8.5)
eGFR: 121 mL/min/{1.73_m2} (ref >59)

## 2021-07-21 ENCOUNTER — Encounter: Payer: Self-pay | Admitting: Obstetrics & Gynecology

## 2021-07-21 ENCOUNTER — Ambulatory Visit (INDEPENDENT_AMBULATORY_CARE_PROVIDER_SITE_OTHER): Payer: Medicaid Other | Admitting: Obstetrics & Gynecology

## 2021-07-21 ENCOUNTER — Ambulatory Visit (INDEPENDENT_AMBULATORY_CARE_PROVIDER_SITE_OTHER): Payer: Medicaid Other

## 2021-07-21 VITALS — BP 133/82 | HR 85 | Wt 311.0 lb

## 2021-07-21 DIAGNOSIS — O0993 Supervision of high risk pregnancy, unspecified, third trimester: Secondary | ICD-10-CM

## 2021-07-21 DIAGNOSIS — N898 Other specified noninflammatory disorders of vagina: Secondary | ICD-10-CM | POA: Diagnosis not present

## 2021-07-21 DIAGNOSIS — O26893 Other specified pregnancy related conditions, third trimester: Secondary | ICD-10-CM | POA: Diagnosis not present

## 2021-07-21 DIAGNOSIS — O10919 Unspecified pre-existing hypertension complicating pregnancy, unspecified trimester: Secondary | ICD-10-CM | POA: Diagnosis not present

## 2021-07-21 DIAGNOSIS — Z3A32 32 weeks gestation of pregnancy: Secondary | ICD-10-CM | POA: Diagnosis not present

## 2021-07-21 LAB — POCT URINALYSIS DIPSTICK OB
Blood, UA: NEGATIVE
Glucose, UA: NEGATIVE
Ketones, UA: NEGATIVE
Leukocytes, UA: NEGATIVE
Nitrite, UA: NEGATIVE

## 2021-07-21 LAB — POCT WET PREP (WET MOUNT)
Trichomonas Wet Prep HPF POC: ABSENT
WBC, Wet Prep HPF POC: NEGATIVE

## 2021-07-21 NOTE — Progress Notes (Signed)
? ?HIGH-RISK PREGNANCY VISIT ?Patient name: Kylie Bailey MRN [id number]  Date of birth: [birth year] ?Chief Complaint:   ?High Risk Gestation ([place] today; has a mucus discharge; milk is coming in; + pelvic pain) ? ?History of Present Illness:   ?Kylie Bailey is a 31 y.o. [phone] female at [redacted]w[redacted]d with an Estimated Date of Delivery: 09/11/21 being seen today for ongoing management of a high-risk pregnancy complicated by chronic HTN- on Labetalol 200mg  bid.   ? ?Today she reports  increased thick clear discharge.  No itching or odor.  Denies pelvic or abdominal pain .  ? ?Contractions: Irritability. Vag. Bleeding: None.  Movement: Present. denies leaking of fluid.  ? ? ?  06/23/2021  ? 10:11 AM 03/19/2021  ?  9:42 AM 03/09/2021  ?  2:27 PM 08/15/2020  ?  8:50 AM 06/23/2020  ? 10:06 AM  ?Depression screen PHQ 2/9  ?Decreased Interest 0 0 1 0 0  ?Down, Depressed, Hopeless 0 0 0 0 0  ?PHQ - 2 Score 0 0 1 0 0  ?Altered sleeping 0 0 0    ?Tired, decreased energy 0 0 1    ?Change in appetite 0 0 1    ?Feeling bad or failure about yourself  0 0 0    ?Trouble concentrating 0 0 0    ?Moving slowly or fidgety/restless 0 0 0    ?Suicidal thoughts 0 0 0    ?PHQ-9 Score 0 0 3    ? ? ? ?Current Outpatient Medications  ?Medication Instructions  ? acetaminophen (TYLENOL) 500 mg, Oral, Every 6 hours PRN  ? albuterol (VENTOLIN HFA) 108 (90 Base) MCG/ACT inhaler 1-2 puffs, Inhalation, Every 6 hours PRN  ? aspirin 162 mg, Oral, Daily, Swallow whole.  ? Blood Pressure Monitor MISC For regular home bp monitoring during pregnancy  ? calcium carbonate (TUMS - DOSED IN MG ELEMENTAL CALCIUM) 500 MG chewable tablet 2 tablets, Oral, 2 times daily PRN  ? hydrOXYzine (VISTARIL) 50 mg, Oral, 3 times daily PRN  ? labetalol (NORMODYNE) 200 MG tablet TAKE 1 TABLET(200 MG) BY MOUTH TWICE DAILY  ? Prenatal MV & Min w/FA-DHA (PRENATAL ADULT GUMMY/DHA/FA) 0.4-25 MG CHEW 1 tablet, Oral, Daily  ?  ? ?Review of Systems:   ?Pertinent items are noted in  HPI ?Denies headaches, visual changes, shortness of breath, chest pain, abdominal pain, severe nausea/vomiting, or problems with urination or bowel movements unless otherwise stated above. ?Pertinent History Reviewed:  ?Reviewed past medical,surgical, social, obstetrical and family history.  ?Reviewed problem list, medications and allergies. ?Physical Assessment:  ? ?Vitals:  ? 07/21/21 1511  ?BP: 133/82  ?Pulse: 85  ?Weight: (!) 311 lb (141.1 kg)  ?Body mass index is 44.62 kg/m?. ?     ?     Physical Examination:  ? General appearance: alert, well appearing, and in no distress ? Mental status: normal mood, behavior, speech, dress, motor activity, and thought processes ? Skin: warm & dry  ? Extremities: Edema: None  ?  Cardiovascular: normal heart rate noted ? Respiratory: normal respiratory effort, no distress ? Abdomen: gravid, soft, non-tender ? Pelvic:  normal external genitalia, vaginal mucosa, minimal white to clear discharge noted, cervix appears closed         ? ?Fetal Status:     Movement: Present   ? ?Fetal Surveillance Testing today: Growth scan:  ? ?cephalic,FHR XX123456 bpm,posterior placenta gr 0,AFI 13.5 cm,BPP 8/8,RI .66,.64,.70=76%,EFW 2068 49% ? ?Chaperone: Levy Pupa   ? ?No results found  for this or any previous visit (from the past 24 hour(s)).  ? ?Assessment & Plan:  ?High-risk pregnancy: [id number] at [redacted]w[redacted]d with an Estimated Date of Delivery: 09/11/21  ? ?1) cHTN ?-on Labetalol 200mg  bid ?-continue antepartum testing ?-plan for IOL 37-39wk pending BP control ? ?2) Vaginal discharge ?-wet prep negative ? ?3) Desires BTL- paperwork to be signed ? ?Meds: No orders of the defined types were placed in this encounter. ? ? ?Labs/procedures today: growth scan ? ?Treatment Plan:  as outlined above ? ?Reviewed: Preterm labor symptoms and general obstetric precautions including but not limited to vaginal bleeding, contractions, leaking of fluid and fetal movement were reviewed in detail with the patient.  All  questions were answered. Pt has home bp cuff. Check bp weekly, let us know if >140/90.  ? ?Follow-up: Return in about 1 week (around 07/28/2021) for resign tubal paperwork, appts as scheduled. ? ? ?Future Appointments  ?Date Time Provider [place]  ?07/24/2021 10:10 AM [facility]-FTOBGYN NURSE [facility]-FT FTOBGYN  ?07/28/2021 10:10 AM Florian Buff, MD [facility]-FT FTOBGYN  ?07/31/2021 11:10 AM [facility]-FTOBGYN NURSE [facility]FT FTOBGYN  ?08/04/2021  9:10 AM Roma Schanz, CNM [facility]-FT FTOBGYN  ?08/07/2021 11:10 AM [facility]-FTOBGYN NURSE [facility]-FT FTOBGYN  ?08/11/2021  9:50 AM Florian Buff, MD [facility]-FT FTOBGYN  ?08/14/2021 10:50 AM [facility]-FTOBGYN NURSE [facility]-FT FTOBGYN  ?08/18/2021 10:30 AM [facility]-FTOBGYN NURSE [facility]-FT FTOBGYN  ?08/21/2021  9:45 AM [facility] - FTOBGYN [place] C[facility]-FTIMG None  ?08/21/2021 10:30 AM Janyth Pupa, DO [facility]-FT FTOBGYN  ?08/25/2021 10:10 AM Roma Schanz, CNM [facility]FT FTOBGYN  ?08/28/2021 10:30 AM [facility]-FTOBGYN NURSE [facility]-FT FTOBGYN  ?09/01/2021 10:10 AM Florian Buff, MD [facility]FT FTOBGYN  ?09/04/2021 10:30 AM [facility]-FTOBGYN NURSE [facility]FT FTOBGYN  ?09/11/2021 10:10 AM [facility]-FTOBGYN NURSE [facility]-FT FTOBGYN  ?03/19/2022  8:00 AM Paseda, Dewaine Conger, FNP RPC-RPC RPC  ? ? ?Orders Placed This Encounter  ?Procedures  ? POC Urinalysis Dipstick OB  ? ? ?Janyth Pupa, DO ?Attending [place], Faculty Practice ?Center for [place] ? ? ? ?

## 2021-07-21 NOTE — Progress Notes (Signed)
[place] [id number] wks,cephalic,FHR XX123456 bpm,posterior placenta gr 0,AFI 13.5 cm,BPP 8/8,RI .66,.64,.70=76%,EFW 2068 G 49% ?

## 2021-07-24 ENCOUNTER — Ambulatory Visit (INDEPENDENT_AMBULATORY_CARE_PROVIDER_SITE_OTHER): Payer: Medicaid Other

## 2021-07-24 VITALS — BP 133/83 | HR 92 | Wt 314.0 lb

## 2021-07-24 DIAGNOSIS — Z3689 Encounter for other specified antenatal screening: Secondary | ICD-10-CM

## 2021-07-24 DIAGNOSIS — Z3A33 33 weeks gestation of pregnancy: Secondary | ICD-10-CM | POA: Diagnosis not present

## 2021-07-24 NOTE — Progress Notes (Signed)
? ?  NURSE VISIT- NST ? ?SUBJECTIVE:  ?Kylie Bailey is Kylie 31 y.o. [phone] female at [redacted]w[redacted]d, here for Kylie NST for pregnancy complicated by [facility].  She reports active fetal movement, contractions: none, vaginal bleeding: none, membranes: intact.  ? ?OBJECTIVE:  ?BP 133/83   Pulse 92   Wt (!) 314 lb (142.4 kg)   LMP 12/05/2020 (Exact Date)   BMI 45.05 kg/m?   ?Appears well, no apparent distress ? ?No results found for this or any previous visit (from the past 24 hour(s)). ? ?NST: FHR baseline 130-140 bpm, Variability: moderate, Accelerations:present, Decelerations:  Absent= Cat 1/reactive ?Toco: none  ? ?ASSESSMENT: ?[id number] at [redacted]w[redacted]d with CHTN ?NST reactive ? ?PLAN: ?EFM strip interpreted and reviewed by Dr. Alysia Penna   ?Recommendations: keep next appointment as scheduled   ? ?Kylie Bailey Kylie Bailey  ?07/24/2021 ?12:49 PM  ?

## 2021-07-28 ENCOUNTER — Ambulatory Visit (INDEPENDENT_AMBULATORY_CARE_PROVIDER_SITE_OTHER): Payer: Medicaid Other | Admitting: Obstetrics & Gynecology

## 2021-07-28 ENCOUNTER — Encounter: Payer: Self-pay | Admitting: Obstetrics & Gynecology

## 2021-07-28 VITALS — BP 121/79 | HR 89 | Wt 314.2 lb

## 2021-07-28 DIAGNOSIS — O10919 Unspecified pre-existing hypertension complicating pregnancy, unspecified trimester: Secondary | ICD-10-CM

## 2021-07-28 DIAGNOSIS — O0993 Supervision of high risk pregnancy, unspecified, third trimester: Secondary | ICD-10-CM

## 2021-07-28 NOTE — Progress Notes (Signed)
? ? ?HIGH-RISK PREGNANCY VISIT ?Patient name: Kylie Bailey MRN [id number]  Date of birth: 01-05-1991 ?Chief Complaint:   ?High Risk Gestation and Non-stress Test ? ?History of Present Illness:   ?Kylie Bailey is a 31 y.o. [phone] female at [redacted]w[redacted]d with an Estimated Date of Delivery: 09/11/21 being seen today for ongoing management of a high-risk pregnancy complicated by chronic hypertension currently on labetalol 200 BID.   ? ?Today she reports no complaints. Contractions: Not present.  .  Movement: Present. denies leaking of fluid.  ? ? ?  06/23/2021  ? 10:11 AM 03/19/2021  ?  9:42 AM 03/09/2021  ?  2:27 PM 08/15/2020  ?  8:50 AM 06/23/2020  ? 10:06 AM  ?Depression screen PHQ 2/9  ?Decreased Interest 0 0 1 0 0  ?Down, Depressed, Hopeless 0 0 0 0 0  ?PHQ - 2 Score 0 0 1 0 0  ?Altered sleeping 0 0 0    ?Tired, decreased energy 0 0 1    ?Change in appetite 0 0 1    ?Feeling bad or failure about yourself  0 0 0    ?Trouble concentrating 0 0 0    ?Moving slowly or fidgety/restless 0 0 0    ?Suicidal thoughts 0 0 0    ?PHQ-9 Score 0 0 3    ? ?  ? ?  06/23/2021  ? 10:11 AM 03/09/2021  ?  2:27 PM 08/15/2020  ?  8:50 AM 04/02/2020  ?  8:34 AM  ?GAD 7 : Generalized Anxiety Score  ?Nervous, Anxious, on Edge 0 0 1 1  ?Control/stop worrying 1 0 1   ?Worry too much - different things 1 0 3   ?Trouble relaxing 0 1 0   ?Restless 0 1 1   ?Easily annoyed or irritable 0 0 3   ?Afraid - awful might happen 0 0 0   ?Total GAD 7 Score 2 2 9    ? ? ? ?Review of Systems:   ?Pertinent items are noted in HPI ?Denies abnormal vaginal discharge w/ itching/odor/irritation, headaches, visual changes, shortness of breath, chest pain, abdominal pain, severe nausea/vomiting, or problems with urination or bowel movements unless otherwise stated above. ?Pertinent History Reviewed:  ?Reviewed past medical,surgical, social, obstetrical and family history.  ?Reviewed problem list, medications and allergies. ?Physical Assessment:  ? ?Vitals:  ?  07/28/21 1028  ?BP: 121/79  ?Pulse: 89  ?Weight: (!) 314 lb 3.2 oz (142.5 kg)  ?Body mass index is 45.08 kg/m?. ?     ?     Physical Examination:  ? General appearance: alert, well appearing, and in no distress ? Mental status: alert, oriented to person, place, and time ? Skin: warm & dry  ? Extremities: Edema: None  ?  Cardiovascular: normal heart rate noted ? Respiratory: normal respiratory effort, no distress ? Abdomen: gravid, soft, non-tender ? Pelvic: Cervical exam deferred        ? ?Fetal Status:     Movement: Present   ? ?Fetal Surveillance Testing today: Reactive NST ? ?Kylie Bailey is at [redacted]w[redacted]d Estimated Date of Delivery: 09/11/21  ?NST being performed due to [facility] ? ?Today the NST is Reactive ? ?Fetal Monitoring:  Baseline: 140 bpm, Variability: Good {> 6 bpm), Accelerations: Reactive, and Decelerations: Absent   reactive ? ?The accelerations are >15 bpm and more than 2 in 20 minutes ? ?Final diagnosis:  Reactive NST ? ?Florian Buff, MD  ?  ? ?Chaperone: N/A   ? ?  No results found for this or any previous visit (from the past 24 hour(s)).  ?Assessment & Plan:  ?High-risk pregnancy: [id number] at [redacted]w[redacted]d with an Estimated Date of Delivery: 09/11/21  ? ? ?  ICD-10-CM   ?1. Supervision of high risk pregnancy in third trimester  O09.93   ?  ?2. Chronic hypertension affecting pregnancy  O10.919   ? on Labetalol 200 BID with good control  ?  ?  ? ? ?Meds: No orders of the defined types were placed in this encounter. ? ? ?Orders: No orders of the defined types were placed in this encounter. ?  ? ?Labs/procedures today: NST ? ?Treatment Plan:  continue twice weekly surveillance ? ?Reviewed: Preterm labor symptoms and general obstetric precautions including but not limited to vaginal bleeding, contractions, leaking of fluid and fetal movement were reviewed in detail with the patient.  All questions were answered. Does have home bp cuff. Office bp cuff given: yes. Check bp daily, let us know if consistently >150  and/or >95. ? ?Follow-up: Return for keep scheduled. ? ? ?Future Appointments  ?Date Time Provider [place]  ?07/31/2021 11:10 AM [facility] NURSE [facility]  ?08/04/2021  9:10 AM Roma Schanz, CNM [facility]  ?08/07/2021 11:10 AM [facility] NURSE [facility]  ?08/11/2021  9:50 AM Florian Buff, MD [facility]  ?08/14/2021 10:50 AM [facility] NURSE [facility]  ?08/18/2021 10:30 AM [facility] NURSE [facility]  ?08/21/2021  9:45 AM [facility] - FTOBGYN [place] [facility]-FT[facility]G None  ?08/21/2021 10:30 AM Janyth Pupa, DO [facility]  ?08/25/2021 10:10 AM Roma Schanz, CNM [facility]  ?08/28/2021 10:30 AM [facility] NURSE [facility]  ?09/01/2021 10:10 AM Florian Buff, MD [facility]  ?09/04/2021 10:30 AM [facility] NURSE [facility]  ?09/11/2021 10:10 AM [facility] NURSE [facility]  ?03/19/2022  8:00 AM Paseda, Dewaine Conger, FNP RPC-RPC RPC  ? ? ?No orders of the defined types were placed in this encounter. ? ?Florian Buff  ?Attending Physician for the [facility] ?07/28/2021 ?11:23 AM ? ?

## 2021-07-31 ENCOUNTER — Ambulatory Visit (INDEPENDENT_AMBULATORY_CARE_PROVIDER_SITE_OTHER): Payer: Medicaid Other | Admitting: *Deleted

## 2021-07-31 DIAGNOSIS — Z3A34 34 weeks gestation of pregnancy: Secondary | ICD-10-CM | POA: Diagnosis not present

## 2021-07-31 DIAGNOSIS — O10919 Unspecified pre-existing hypertension complicating pregnancy, unspecified trimester: Secondary | ICD-10-CM | POA: Diagnosis not present

## 2021-07-31 DIAGNOSIS — O0993 Supervision of high risk pregnancy, unspecified, third trimester: Secondary | ICD-10-CM | POA: Diagnosis not present

## 2021-07-31 NOTE — Progress Notes (Signed)
? ?  NURSE VISIT- NST ? ?SUBJECTIVE:  ?Kylie Bailey is a 31 y.o. [phone] female at [redacted]w[redacted]d, here for a NST for pregnancy complicated by [facility].  She reports active fetal movement, contractions: none, vaginal bleeding: none, membranes: intact.  ? ?OBJECTIVE:  ?BP 113/69   Pulse 89   Wt (!) 331 lb 12.8 oz (150.5 kg)   LMP 12/05/2020 (Exact Date)   BMI 47.61 kg/m?   ?Appears well, no apparent distress ? ?Pt unable to void ? ?NST: FHR baseline 140 bpm, Variability: moderate, Accelerations:present, Decelerations:  Absent= Cat 1/reactive ?Toco: none  ? ?ASSESSMENT: ?[id number] at [redacted]w[redacted]d with CHTN ?NST reactive ? ?PLAN: ?EFM strip reviewed by Knute Neu, CNM, [place]   ?Recommendations: keep next appointment as scheduled   ? ?Kylie Bailey  ?07/31/2021 ?12:35 PM ? ?

## 2021-08-04 ENCOUNTER — Ambulatory Visit (INDEPENDENT_AMBULATORY_CARE_PROVIDER_SITE_OTHER): Payer: Medicaid Other | Admitting: Women's Health

## 2021-08-04 ENCOUNTER — Encounter: Payer: Self-pay | Admitting: Women's Health

## 2021-08-04 VITALS — BP 144/88 | HR 87 | Wt 314.2 lb

## 2021-08-04 DIAGNOSIS — Z3A34 34 weeks gestation of pregnancy: Secondary | ICD-10-CM

## 2021-08-04 DIAGNOSIS — O10919 Unspecified pre-existing hypertension complicating pregnancy, unspecified trimester: Secondary | ICD-10-CM | POA: Diagnosis not present

## 2021-08-04 DIAGNOSIS — O0993 Supervision of high risk pregnancy, unspecified, third trimester: Secondary | ICD-10-CM

## 2021-08-04 LAB — POCT URINALYSIS DIPSTICK OB
Blood, UA: NEGATIVE
Glucose, UA: NEGATIVE
Ketones, UA: NEGATIVE
Leukocytes, UA: NEGATIVE
Nitrite, UA: NEGATIVE
POC,PROTEIN,UA: NEGATIVE

## 2021-08-04 NOTE — Progress Notes (Signed)
? ?HIGH-RISK PREGNANCY VISIT ?Patient name: Kylie Bailey MRN [id number]  Date of birth: July 14, 1990 ?Chief Complaint:   ?Routine Prenatal Visit ? ?History of Present Illness:   ?Kylie Bailey is a 31 y.o. [phone] female at [redacted]w[redacted]d with an Estimated Date of Delivery: 09/11/21 being seen today for ongoing management of a high-risk pregnancy complicated by chronic hypertension currently on labetalol 200mg  BID.   ? ?Today she reports  doesn't feel well, some headaches this past week- go away w/ apap/rest, occ seeing spots, none now. No ruq/epigastric pain, n/v . Took labetalol @ 0700. Contractions: Not present. Vag. Bleeding: None.  Movement: Present. denies leaking of fluid.  ? ? ?  06/23/2021  ? 10:11 AM 03/19/2021  ?  9:42 AM 03/09/2021  ?  2:27 PM 08/15/2020  ?  8:50 AM 06/23/2020  ? 10:06 AM  ?Depression screen PHQ 2/9  ?Decreased Interest 0 0 1 0 0  ?Down, Depressed, Hopeless 0 0 0 0 0  ?PHQ - 2 Score 0 0 1 0 0  ?Altered sleeping 0 0 0    ?Tired, decreased energy 0 0 1    ?Change in appetite 0 0 1    ?Feeling bad or failure about yourself  0 0 0    ?Trouble concentrating 0 0 0    ?Moving slowly or fidgety/restless 0 0 0    ?Suicidal thoughts 0 0 0    ?PHQ-9 Score 0 0 3    ? ?  ? ?  06/23/2021  ? 10:11 AM 03/09/2021  ?  2:27 PM 08/15/2020  ?  8:50 AM 04/02/2020  ?  8:34 AM  ?GAD 7 : Generalized Anxiety Score  ?Nervous, Anxious, on Edge 0 0 1 1  ?Control/stop worrying 1 0 1   ?Worry too much - different things 1 0 3   ?Trouble relaxing 0 1 0   ?Restless 0 1 1   ?Easily annoyed or irritable 0 0 3   ?Afraid - awful might happen 0 0 0   ?Total GAD 7 Score 2 2 9    ? ? ? ?Review of Systems:   ?Pertinent items are noted in HPI ?Denies abnormal vaginal discharge w/ itching/odor/irritation, headaches, visual changes, shortness of breath, chest pain, abdominal pain, severe nausea/vomiting, or problems with urination or bowel movements unless otherwise stated above. ?Pertinent History Reviewed:  ?Reviewed past  medical,surgical, social, obstetrical and family history.  ?Reviewed problem list, medications and allergies. ?Physical Assessment:  ? ?Vitals:  ? 08/04/21 0914 08/04/21 0951  ?BP: (!) 143/87 (!) 144/88  ?Pulse: 89 87  ?Weight: (!) 314 lb 3.2 oz (142.5 kg)   ?Body mass index is 45.08 kg/m?. ?     ?     Physical Examination:  ? General appearance: alert, well appearing, and in no distress ? Mental status: alert, oriented to person, place, and time ? Skin: warm & dry  ? Extremities: Edema: Trace  ?  Cardiovascular: normal heart rate noted ? Respiratory: normal respiratory effort, no distress ? Abdomen: gravid, soft, non-tender ? Pelvic: Cervical exam deferred        ? ?Fetal Status: Fetal Heart Rate (bpm): 140   Movement: Present   ? ?Fetal Surveillance Testing today: NST: FHR baseline 140 bpm, Variability: moderate, Accelerations:present, Decelerations:  Absent= Cat 1/reactive ?Toco: none   ? ?Chaperone: N/A   ? ?Results for orders placed or performed in visit on 08/04/21 (from the past 24 hour(s))  ?POC Urinalysis Dipstick OB  ? Collection Time: 08/04/21  9:51 AM  ?Result Value Ref Range  ? Color, UA    ? Clarity, UA    ? Glucose, UA Negative Negative  ? Bilirubin, UA    ? Ketones, UA neg   ? Spec Grav, UA    ? Blood, UA neg   ? pH, UA    ? POC,PROTEIN,UA Negative Negative, Trace, Small (1+), Moderate (2+), Large (3+), 4+  ? Urobilinogen, UA    ? Nitrite, UA neg   ? Leukocytes, UA Negative Negative  ? Appearance    ? Odor    ?  ?Assessment & Plan:  ?High-risk pregnancy: [id number] at [redacted]w[redacted]d with an Estimated Date of Delivery: 09/11/21  ? ?1) CHTN, on labetalol 200mg  BID, currently asymptomatic, no proteinuria, bp up a little from what it has been, will check pre-e labs. Check bp's BID at home, reviewed pre-e s/s, reasons to seek care, severe range bp's ? ?Meds: No orders of the defined types were placed in this encounter. ? ?Labs/procedures today: NST and pre-e labs ? ?Treatment Plan:   Growth u/s q 4wks    2x/wk testing  nst/sono    Deliver 38-39wks (37wks or prn if poor control)____  ? ?Reviewed: Preterm labor symptoms and general obstetric precautions including but not limited to vaginal bleeding, contractions, leaking of fluid and fetal movement were reviewed in detail with the patient.  All questions were answered. Does have home bp cuff. Office bp cuff given: not applicable.  ?Follow-up: Return for needs 2x/wk visits- bpp/dopp w/ hrob md/cnm, and nst/nurse. ? ? ?Future Appointments  ?Date Time Provider [place]  ?08/07/2021 11:10 AM [facility] NURSE [facility]  ?08/11/2021  9:50 AM Florian Buff, MD [facility]  ?08/14/2021 10:50 AM [facility] NURSE [facility]  ?08/18/2021 10:30 AM [facility] NURSE [facility]  ?08/21/2021  9:45 AM [facility] - FTOBGYN [place] C[facility]-FTIMG None  ?08/21/2021 10:30 AM Janyth Pupa, DO [facility]  ?08/25/2021 10:10 AM Roma Schanz, CNM [facility]  ?08/28/2021 10:30 AM [facility] NURSE [facility]  ?09/01/2021 10:10 AM Florian Buff, MD [facility]  ?09/04/2021 10:30 AM [facility] NURSE [facility]  ?09/11/2021 10:10 AM [facility] NURSE [facility]  ?03/19/2022  8:00 AM Paseda, Dewaine Conger, FNP RPC-RPC RPC  ? ? ?Orders Placed This Encounter  ?Procedures  ? CBC  ? Comprehensive metabolic panel  ? Protein / creatinine ratio, urine  ? POC Urinalysis Dipstick OB  ? ?[place], [place] ?08/04/2021 ?9:56 AM  ?

## 2021-08-04 NOTE — Progress Notes (Signed)
i

## 2021-08-04 NOTE — Patient Instructions (Signed)
Kylie Bailey, thank you for choosing our office today! We appreciate the opportunity to meet your healthcare needs. You may receive a short survey by mail, e-mail, or through [employer]. If you are happy with your care we would appreciate if you could take just a few minutes to complete the survey questions. We read all of your comments and take your feedback very seriously. Thank you again for choosing our office.  ?Center for [employer] Team at [facility] ? ?[facility]'s [facility] at [facility] ?([address] [place], [place] [address]) ?Entrance C, located off of [address] ?Free 24/7 valet parking  ? ?CLASSES: Go to [email or web address] to register for classes (childbirth, breastfeeding, waterbirth, infant CPR, daddy bootcamp, etc.) ? ?Call the office [phone]) or go to [facility] if: ?You begin to have strong, frequent contractions ?Your water breaks.  Sometimes it is a big gush of fluid, sometimes it is just a trickle that keeps getting your panties wet or running down your legs ?You have vaginal bleeding.  It is normal to have a small amount of spotting if your cervix was checked.  ?You don't feel your baby moving like normal.  If you don't, get you something to eat and drink and lay down and focus on feeling your baby move.   If your baby is still not moving like normal, you should call the office or go to [facility]. ? ?Call the office [phone]) or go to [facility] hospital for these signs of pre-eclampsia: ?Severe headache that does not go away with Tylenol ?Visual changes- seeing spots, double, blurred vision ?Pain under your right breast or upper abdomen that does not go away with Tums or heartburn medicine ?Nausea and/or vomiting ?Severe swelling in your hands, feet, and face  ? ?Tdap Vaccine ?It is recommended that you get the Tdap vaccine during the third trimester of EACH pregnancy to help protect your baby from getting pertussis (whooping cough) ?27-36 weeks is the BEST time to do  this so that you can pass the protection on to your baby. During pregnancy is better than after pregnancy, but if you are unable to get it during pregnancy it will be offered at the hospital.  ?You can get this vaccine with [place], at the health department, your family doctor, or some local pharmacies ?Everyone who will be around your baby should also be up-to-date on their vaccines before the baby comes. Adults (who are not pregnant) only need 1 dose of Tdap during adulthood.  ? ?[place] Pediatricians/Family Doctors ?[facility]): [address], [phone]           ?[facility]: [address], [phone]                ?[facility]): [address], [phone] (call to ask if accepting patients) ?[facility]: [address], [place], [phone]   ? ?[place] Pediatricians/Family Doctors ?[facility]): [address], [phone] ?[facility]: [address] [place], [phone] ?[facility] [place]: [address], [phone] ? ?[employer] Family Doctors  ?[facility]): [phone] ?[facility]: [address], [phone]  ? ?[facility] Family Doctors ?[facility]: [address], [phone] ? ?[facility] Family Doctors  ?[facility]: [phone], [phone] ? ?Home Blood Pressure Monitoring for Patients  ? ?Your provider has recommended that you check your  blood pressure (BP) at least once a week at home. If you do not have a blood pressure cuff at home, one will be provided for you. Contact your provider if you have not received your monitor within 1 week.  ? ?Helpful Tips for Accurate Home Blood Pressure Checks  ?Don't smoke, exercise, or drink caffeine 30 minutes before checking your BP ?Use the restroom before checking your BP (a full bladder can raise your  pressure) ?Relax in a comfortable upright chair ?Feet on the ground ?Left arm resting comfortably on a flat surface at the level of your heart ?Legs uncrossed ?Back supported ?Sit quietly and don't talk ?Place the cuff on your bare arm ?Adjust snuggly, so that only two fingertips can fit between your skin and the top of the cuff ?Check 2 readings separated by at least one minute ?Keep a log of your BP readings ?For a visual, please reference this diagram: http://ccnc.care/bpdiagram ? ?Provider Name: [facility]     Phone: [phone] ? ?Zone 1: ALL CLEAR  ?Continue to monitor your symptoms:  ?BP reading is less than 140 (top number) or less than 90 (bottom number)  ?No right upper stomach pain ?No headaches or seeing spots ?No feeling nauseated or throwing up ?No swelling in face and hands ? ?Zone 2: CAUTION ?Call your doctor's office for any of the following:  ?BP reading is greater than 140 (top number) or greater than 90 (bottom number)  ?Stomach pain under your ribs in the middle or right side ?Headaches or seeing spots ?Feeling nauseated or throwing up ?Swelling in face and hands ? ?Zone 3: EMERGENCY  ?Seek immediate medical care if you have any of the following:  ?BP reading is greater than160 (top number) or greater than 110 (bottom number) ?Severe headaches not improving with Tylenol ?Serious difficulty catching your breath ?Any worsening symptoms from Zone 2  ?Preterm Labor and Birth Information ? ?The normal length of a pregnancy is 39-41 weeks. Preterm labor is when labor starts before 37 completed weeks of pregnancy. ?What are the risk factors for preterm labor? ?Preterm labor is more likely to occur in women who: ?Have certain infections during pregnancy such as a bladder infection, sexually transmitted infection, or infection inside the uterus (chorioamnionitis). ?Have a shorter-than-normal cervix. ?Have gone into preterm labor before. ?Have had surgery on their cervix. ?Are younger than age 72  or older than age 108. ?Are African American. ?Are pregnant with twins or multiple babies (multiple gestation). ?Take street drugs or smoke while pregnant. ?Do not gain enough weight while pregnant. ?Became pregnant shortly after having been pregnant. ?What are the symptoms of preterm labor? ?Symptoms of preterm labor include: ?Cramps similar to those that can happen during a menstrual period. The cramps may happen with diarrhea. ?Pain in the abdomen or lower back. ?Regular uterine contractions that may feel like tightening of the abdomen. ?A feeling of increased pressure in the pelvis. ?Increased watery or bloody mucus discharge from the vagina. ?Water breaking (ruptured amniotic sac). ?Why is it important to recognize signs of preterm labor? ?It is important to recognize signs of preterm labor because babies who are born prematurely may not be fully developed. This can put them at an increased risk for: ?Long-term (chronic) heart and lung problems. ?Difficulty immediately after birth with regulating body systems, including blood sugar, body temperature, heart rate, and breathing rate. ?Bleeding in the brain. ?Cerebral palsy. ?Learning difficulties. ?Death. ?These risks are highest for babies who are born before 11 weeks  of pregnancy. ?How is preterm labor treated? ?Treatment depends on the length of your pregnancy, your condition, and the health of your baby. It may involve: ?Having a stitch (suture) placed in your cervix to prevent your cervix from opening too early (cerclage). ?Taking or being given medicines, such as: ?Hormone medicines. These may be given early in pregnancy to help support the pregnancy. ?Medicine to stop contractions. ?Medicines to help mature the baby?s lungs. These may be prescribed if the risk of delivery is high. ?Medicines to prevent your baby from developing cerebral palsy. ?If the labor happens before 34 weeks of pregnancy, you may need to stay in the hospital. ?What should I do if I  think I am in preterm labor? ?If you think that you are going into preterm labor, call your health care provider right away. ?How can I prevent preterm labor in future pregnancies? ?To increase your chanc

## 2021-08-05 LAB — COMPREHENSIVE METABOLIC PANEL
ALT: 13 IU/L (ref 0–32)
AST: 15 IU/L (ref 0–40)
Albumin/Globulin Ratio: 1.6 (ref 1.2–2.2)
Albumin: 3.4 g/dL — ABNORMAL LOW (ref 3.9–5.0)
Alkaline Phosphatase: 115 IU/L (ref 44–121)
BUN/Creatinine Ratio: 10 (ref 9–23)
BUN: 6 mg/dL (ref 6–20)
Bilirubin Total: <0.2 mg/dL (ref 0.0–1.2)
CO2: 18 mmol/L — ABNORMAL LOW (ref 20–29)
Calcium: 9 mg/dL (ref 8.7–10.2)
Chloride: 106 mmol/L (ref 96–106)
Creatinine, Ser: 0.58 mg/dL (ref 0.57–1.00)
Globulin, Total: 2.1 g/dL (ref 1.5–4.5)
Glucose: 117 mg/dL — ABNORMAL HIGH (ref 70–99)
Potassium: 4 mmol/L (ref 3.5–5.2)
Sodium: 137 mmol/L (ref 134–144)
Total Protein: 5.5 g/dL — ABNORMAL LOW (ref 6.0–8.5)
eGFR: 126 mL/min/{1.73_m2} (ref >59)

## 2021-08-05 LAB — PROTEIN / CREATININE RATIO, URINE
Creatinine, Urine: 191.5 mg/dL
Protein, Ur: 19.3 mg/dL
Protein/Creat Ratio: 101 mg/g creat (ref 0–200)

## 2021-08-05 LAB — CBC
Hematocrit: 37.4 % (ref 34.0–46.6)
Hemoglobin: 13.4 g/dL (ref 11.1–15.9)
MCH: 33 pg (ref 26.6–33.0)
MCHC: 35.8 g/dL — ABNORMAL HIGH (ref 31.5–35.7)
MCV: 92 fL (ref 79–97)
Platelets: 215 10*3/uL (ref 150–450)
RBC: 4.06 x10E6/uL (ref 3.77–5.28)
RDW: 12.9 % (ref 11.7–15.4)
WBC: 10.9 10*3/uL — ABNORMAL HIGH (ref 3.4–10.8)

## 2021-08-06 ENCOUNTER — Other Ambulatory Visit: Payer: Self-pay | Admitting: Women's Health

## 2021-08-06 DIAGNOSIS — O10919 Unspecified pre-existing hypertension complicating pregnancy, unspecified trimester: Secondary | ICD-10-CM

## 2021-08-07 ENCOUNTER — Other Ambulatory Visit: Payer: Medicaid Other

## 2021-08-07 ENCOUNTER — Ambulatory Visit (INDEPENDENT_AMBULATORY_CARE_PROVIDER_SITE_OTHER): Payer: Medicaid Other

## 2021-08-07 DIAGNOSIS — O0993 Supervision of high risk pregnancy, unspecified, third trimester: Secondary | ICD-10-CM

## 2021-08-07 DIAGNOSIS — O10919 Unspecified pre-existing hypertension complicating pregnancy, unspecified trimester: Secondary | ICD-10-CM

## 2021-08-07 NOTE — Progress Notes (Signed)
[place] 35 wks,cephalic,BPP 8/8,RI .65,.56,.61,.62=57%,BPM 159 bpm,posterior placenta gr 3,AFI 12.2 cm ? ? ?

## 2021-08-11 ENCOUNTER — Encounter: Payer: Self-pay | Admitting: Obstetrics & Gynecology

## 2021-08-11 ENCOUNTER — Encounter (INDEPENDENT_AMBULATORY_CARE_PROVIDER_SITE_OTHER): Payer: Medicaid Other | Admitting: Obstetrics & Gynecology

## 2021-08-11 VITALS — BP 144/80 | HR 96 | Wt 315.0 lb

## 2021-08-11 DIAGNOSIS — O10919 Unspecified pre-existing hypertension complicating pregnancy, unspecified trimester: Secondary | ICD-10-CM

## 2021-08-11 DIAGNOSIS — O0993 Supervision of high risk pregnancy, unspecified, third trimester: Secondary | ICD-10-CM | POA: Diagnosis not present

## 2021-08-11 DIAGNOSIS — Z3A35 35 weeks gestation of pregnancy: Secondary | ICD-10-CM

## 2021-08-11 LAB — POCT URINALYSIS DIPSTICK OB
Blood, UA: NEGATIVE
Glucose, UA: NEGATIVE
Ketones, UA: NEGATIVE
Leukocytes, UA: NEGATIVE
Nitrite, UA: NEGATIVE

## 2021-08-11 NOTE — Progress Notes (Addendum)
This encounter was created in error - please disregard.

## 2021-08-11 NOTE — Progress Notes (Signed)
Patient ID: Kylie Bailey, female   DOB: Mar 14, 1991, 31 y.o.   MRN: Kylie Bailey ? ? ? ?HIGH-RISK PREGNANCY VISIT ?Patient name: Kylie Bailey MRN Kylie Bailey  Date of birth: 11/19/1990 ?Chief Complaint:   ?Routine Prenatal Visit, High Risk Gestation, Non-stress Test, and Error ? ?History of Present Illness:   ?Kylie Bailey is a 31 y.o. [phone] female at [redacted]w[redacted]d with an Estimated Date of Delivery: 09/11/21 being seen today for ongoing management of a high-risk pregnancy complicated by chronic hypertension currently on labetalol 200 BID.   ? ?Today she reports no complaints. Contractions: Irritability. Vag. Bleeding: None.  Movement: Present. denies leaking of fluid.  ? ? ?  06/23/2021  ? 10:11 AM 03/19/2021  ?  9:42 AM 03/09/2021  ?  2:27 PM 08/15/2020  ?  8:50 AM 06/23/2020  ? 10:06 AM  ?Depression screen PHQ 2/9  ?Decreased Interest 0 0 1 0 0  ?Down, Depressed, Hopeless 0 0 0 0 0  ?PHQ - 2 Score 0 0 1 0 0  ?Altered sleeping 0 0 0    ?Tired, decreased energy 0 0 1    ?Change in appetite 0 0 1    ?Feeling bad or failure about yourself  0 0 0    ?Trouble concentrating 0 0 0    ?Moving slowly or fidgety/restless 0 0 0    ?Suicidal thoughts 0 0 0    ?PHQ-9 Score 0 0 3    ? ?  ? ?  06/23/2021  ? 10:11 AM 03/09/2021  ?  2:27 PM 08/15/2020  ?  8:50 AM 04/02/2020  ?  8:34 AM  ?GAD 7 : Generalized Anxiety Score  ?Nervous, Anxious, on Edge 0 0 1 1  ?Control/stop worrying 1 0 1   ?Worry too much - different things 1 0 3   ?Trouble relaxing 0 1 0   ?Restless 0 1 1   ?Easily annoyed or irritable 0 0 3   ?Afraid - awful might happen 0 0 0   ?Total GAD 7 Score 2 2 9    ? ? ? ?Review of Systems:   ?Pertinent items are noted in HPI ?Denies abnormal vaginal discharge w/ itching/odor/irritation, headaches, visual changes, shortness of breath, chest pain, abdominal pain, severe nausea/vomiting, or problems with urination or bowel movements unless otherwise stated above. ?Pertinent History Reviewed:  ?Reviewed past medical,surgical,  social, obstetrical and family history.  ?Reviewed problem list, medications and allergies. ?Physical Assessment:  ? ?Vitals:  ? 08/11/21 0950 08/11/21 0954  ?BP: (!) 142/85 (!) 144/80  ?Pulse: 85 96  ?Weight: (!) 315 lb (142.9 kg)   ?Body mass index is 45.2 kg/m?. ?     ?     Physical Examination:  ? General appearance: alert, well appearing, and in no distress ? Mental status: alert, oriented to person, place, and time ? Skin: warm & dry  ? Extremities: Edema: Trace  ?  Cardiovascular: normal heart rate noted ? Respiratory: normal respiratory effort, no distress ? Abdomen: gravid, soft, non-tender ? Pelvic: Cervical exam deferred        ? ?Fetal Status:     Movement: Present   ? ?Fetal Surveillance Testing today: Reactive NST ? ?ZYMIA YACKO is at [redacted]w[redacted]d Estimated Date of Delivery: 09/11/21  ?NST being performed due to [facility] ? ?Today the NST is Reactive ? ?Fetal Monitoring:  Baseline: 140 bpm, Variability: Good {> 6 bpm), Accelerations: Reactive, and Decelerations: Absent   reactive ? ?The accelerations are >15 bpm and  more than 2 in 20 minutes ? ?Final diagnosis:  Reactive NST ? ?Florian Buff, MD  ?  ? ?Chaperone: N/A   ? ?Results for orders placed or performed in visit on 08/11/21 (from the past 24 hour(s))  ?POC Urinalysis Dipstick OB  ? Collection Time: 08/11/21  9:53 AM  ?Result Value Ref Range  ? Color, UA    ? Clarity, UA    ? Glucose, UA Negative Negative  ? Bilirubin, UA    ? Ketones, UA neg   ? Spec Grav, UA    ? Blood, UA neg   ? pH, UA    ? POC,PROTEIN,UA Trace Negative, Trace, Small (1+), Moderate (2+), Large (3+), 4+  ? Urobilinogen, UA    ? Nitrite, UA neg   ? Leukocytes, UA Negative Negative  ? Appearance    ? Odor    ?  ?Assessment & Plan:  ?High-risk pregnancy: [id number] at [redacted]w[redacted]d with an Estimated Date of Delivery: 09/11/21  ? ? ?  ICD-10-CM   ?1. High-risk pregnancy in third trimester  O09.93 POC Urinalysis Dipstick OB  ?  ?2. Chronic hypertension affecting pregnancy  O10.919   ? Labetalol 200  BID, staying140s/80s  ?  ?3. Erroneous encounter - disregard  ERROR EN   ? error by nurse  ?  ?  ? ? ?Meds: No orders of the defined types were placed in this encounter. ? ? ?Orders:  ?Orders Placed This Encounter  ?Procedures  ? POC Urinalysis Dipstick OB  ?  ? ?Labs/procedures today: NST ? ?Treatment Plan:  twice weekly surveillance, IOL 39 weeks ? ?Reviewed: Preterm labor symptoms and general obstetric precautions including but not limited to vaginal bleeding, contractions, leaking of fluid and fetal movement were reviewed in detail with the patient.  All questions were answered. Does have home bp cuff. Office bp cuff given: not applicable. Check bp daily, let us know if consistently >150 and/or >95. ? ?Follow-up: Return for keep scheduled. ? ? ?Future Appointments  ?Date Time Provider [place]  ?08/14/2021 11:30 AM [facility] - [facility] [place] [facility] None  ?08/14/2021 12:30 PM Janyth Pupa, DO [facility]-FT FTO[facility]YN  ?08/18/2021 10:30 AM [facility]FT[facility]GY[facility] NURSE [facility]  ?08/21/2021  9:45 AM [facility] - [facility] [place] [facility] None  ?08/21/2021 10:30 AM Janyth Pupa, DO [facility]TO[facility]  ?08/25/2021 10:10 AM Roma Schanz, CNM [facility]TO[facility]  ?08/28/2021 11:10 AM Myrtis Ser, CNM [facility]TO[facility]  ?08/28/2021 11:30 AM [facility] - [facility] [place] [facility] None  ?09/01/2021 10:10 AM Florian Buff, MD [facility]TO[facility]YN  ?09/04/2021  9:15 AM [facility] - [facility] [place] [facility] None  ?09/04/2021 10:10 AM Florian Buff, MD [facility]TO[facility]  ?09/11/2021 10:10 AM [facility]FTOBGYN NURSE [facility]  ?03/19/2022  8:00 AM Paseda, Dewaine Conger, FNP [facility] RPC  ? ? ?Orders Placed This Encounter  ?Procedures  ? POC Urinalysis Dipstick OB  ? ?[place]  ?Attending Physician for the [facility] ?[place] ?08/11/2021 ?10:43 AM ? ?

## 2021-08-13 ENCOUNTER — Other Ambulatory Visit: Payer: Self-pay | Admitting: Women's Health

## 2021-08-13 DIAGNOSIS — O10919 Unspecified pre-existing hypertension complicating pregnancy, unspecified trimester: Secondary | ICD-10-CM

## 2021-08-14 ENCOUNTER — Ambulatory Visit (INDEPENDENT_AMBULATORY_CARE_PROVIDER_SITE_OTHER): Payer: Medicaid Other

## 2021-08-14 ENCOUNTER — Other Ambulatory Visit (HOSPITAL_COMMUNITY)
Admission: RE | Admit: 2021-08-14 | Discharge: 2021-08-14 | Disposition: A | Discharge status: Home / Self Care | Payer: Medicaid Other | Source: Ambulatory Visit | Attending: Obstetrics & Gynecology | Admitting: Obstetrics & Gynecology

## 2021-08-14 ENCOUNTER — Encounter: Payer: Self-pay | Admitting: Obstetrics & Gynecology

## 2021-08-14 ENCOUNTER — Other Ambulatory Visit: Payer: Medicaid Other

## 2021-08-14 ENCOUNTER — Other Ambulatory Visit: Payer: Self-pay | Admitting: Women's Health

## 2021-08-14 ENCOUNTER — Ambulatory Visit (INDEPENDENT_AMBULATORY_CARE_PROVIDER_SITE_OTHER): Payer: Medicaid Other | Admitting: Obstetrics & Gynecology

## 2021-08-14 VITALS — BP 124/80 | HR 80 | Wt 316.2 lb

## 2021-08-14 DIAGNOSIS — O10919 Unspecified pre-existing hypertension complicating pregnancy, unspecified trimester: Secondary | ICD-10-CM | POA: Diagnosis not present

## 2021-08-14 DIAGNOSIS — Z3A36 36 weeks gestation of pregnancy: Secondary | ICD-10-CM

## 2021-08-14 DIAGNOSIS — O0993 Supervision of high risk pregnancy, unspecified, third trimester: Secondary | ICD-10-CM | POA: Insufficient documentation

## 2021-08-14 LAB — OB RESULTS CONSOLE GBS: GBS: NEGATIVE

## 2021-08-14 NOTE — Progress Notes (Signed)
[place] 36 wks,cephalic,BPP 8/8,FHR 148 bpm,RI .62,.69,.63=81%,EFW 3084 g 77%,AFI 13 cm,posterior placenta gr 3 ?

## 2021-08-14 NOTE — Progress Notes (Signed)
? ?HIGH-RISK PREGNANCY VISIT ?Patient name: Kylie Bailey MRN [id number]  Date of birth: 07/29/1990 ?Chief Complaint:   ?High Risk Gestation ([place] today; GBS, GC/CHL) ? ?History of Present Illness:   ?Kylie Bailey is a 31 y.o. [phone] female at [redacted]w[redacted]d with an Estimated Date of Delivery: 09/11/21 being seen today for ongoing management of a high-risk pregnancy complicated by: ?-CHTN- on Lab 200mg  bid, BP appropriate ?Asymptomatic ? ?Today she reports occasional contractions.  ? ?Contractions: Irritability. Vag. Bleeding: None.  Movement: Present. denies leaking of fluid.  ? ? ?  06/23/2021  ? 10:11 AM 03/19/2021  ?  9:42 AM 03/09/2021  ?  2:27 PM 08/15/2020  ?  8:50 AM 06/23/2020  ? 10:06 AM  ?Depression screen PHQ 2/9  ?Decreased Interest 0 0 1 0 0  ?Down, Depressed, Hopeless 0 0 0 0 0  ?PHQ - 2 Score 0 0 1 0 0  ?Altered sleeping 0 0 0    ?Tired, decreased energy 0 0 1    ?Change in appetite 0 0 1    ?Feeling bad or failure about yourself  0 0 0    ?Trouble concentrating 0 0 0    ?Moving slowly or fidgety/restless 0 0 0    ?Suicidal thoughts 0 0 0    ?PHQ-9 Score 0 0 3    ? ? ? ?Current Outpatient Medications  ?Medication Instructions  ? acetaminophen (TYLENOL) 500 mg, Oral, Every 6 hours PRN  ? albuterol (VENTOLIN HFA) 108 (90 Base) MCG/ACT inhaler 1-2 puffs, Inhalation, Every 6 hours PRN  ? aspirin 162 mg, Oral, Daily, Swallow whole.  ? Blood Pressure Monitor MISC For regular home bp monitoring during pregnancy  ? calcium carbonate (TUMS - DOSED IN MG ELEMENTAL CALCIUM) 500 MG chewable tablet 2 tablets, Oral, 2 times daily PRN  ? labetalol (NORMODYNE) 200 MG tablet TAKE 1 TABLET(200 MG) BY MOUTH TWICE DAILY  ? Prenatal MV & Min w/FA-DHA (PRENATAL ADULT GUMMY/DHA/FA) 0.4-25 MG CHEW 1 tablet, Oral, Daily  ?  ? ?Review of Systems:   ?Pertinent items are noted in HPI ?Denies abnormal vaginal discharge w/ itching/odor/irritation, headaches, visual changes, shortness of breath, chest pain, abdominal pain, severe  nausea/vomiting, or problems with urination or bowel movements unless otherwise stated above. ?Pertinent History Reviewed:  ?Reviewed past medical,surgical, social, obstetrical and family history.  ?Reviewed problem list, medications and allergies. ?Physical Assessment:  ? ?Vitals:  ? 08/14/21 1230  ?BP: 124/80  ?Pulse: 80  ?Weight: (!) 316 lb 3.2 oz (143.4 kg)  ?Body mass index is 45.37 kg/m?. ?     ?     Physical Examination:  ? General appearance: alert, well appearing, and in no distress ? Mental status: normal mood, behavior, speech, dress, motor activity, and thought processes ? Skin: warm & dry  ? Extremities: Edema: Trace  ?  Cardiovascular: normal heart rate noted ? Respiratory: normal respiratory effort, no distress ? Abdomen: gravid, soft, non-tender ? Pelvic: Cervical exam performed  Dilation: Fingertip Effacement (%): Thick Station: Ballotable ? ?Fetal Status:     Movement: Present   ? ?Fetal Surveillance Testing today: [place] 36 wks,cephalic,BPP [id number] bpm,RI .62,.69,.63=81%,EFW 3084 g 77%,AFI 13 cm,posterior placenta gr 3  ? ?Chaperone: Levy Pupa   ? ?No results found for this or any previous visit (from the past 24 hour(s)).  ? ?Assessment & Plan:  ?High-risk pregnancy: [id number] at [redacted]w[redacted]d with an Estimated Date of Delivery: 09/11/21  ? ?1) chronic HTN- continue current medication ?-continue antepartum testing ?-  IOL scheduled for 5/22 ? ?Vaginal cultures obtained ? ? ?Meds: No orders of the defined types were placed in this encounter. ? ? ?Labs/procedures today: BPP/Growth, GBS/GC/C ? ?Treatment Plan:  as above ? ?Reviewed: Preterm labor symptoms and general obstetric precautions including but not limited to vaginal bleeding, contractions, leaking of fluid and fetal movement were reviewed in detail with the patient.  All questions were answered. Pt has home bp cuff. Check bp weekly, let us know if >140/90.  ? ?Follow-up: Return for as scheduled. ? ? ?Future Appointments  ?Date Time Provider [place]  ?08/18/2021 10:30 AM [facility]FT[facility] NURSE [facility]  ?08/21/2021  9:45 AM [facility] - [facility] [place] [facility] None  ?08/21/2021 10:30 AM Janyth Pupa, DO [facility] FTOBGYN  ?08/24/2021  7:15 AM MC-LD SCHED ROOM [facility]-INDC None  ?08/25/2021 10:10 AM Roma Schanz, CNM [facility] FTOBGYN  ?08/28/2021 11:10 AM Myrtis Ser, CNM [facility] FTOBGYN  ?08/28/2021 11:30 AM [facility] - [facility] [place] [facility] None  ?09/01/2021 10:10 AM Florian Buff, MD [facility]TOBG[facility]  ?09/04/2021  9:15 AM [facility] - [facility] [place] [facility] None  ?09/04/2021 10:10 AM Florian Buff, MD [facility]TO[facility]  ?09/11/2021 10:10 AM [facility]FT[facility]GYN NURSE [facility]FT [facility]TO[facility]  ?03/19/2022  8:00 AM Paseda, Dewaine Conger, FNP RPC-RPC RPC  ? ? ?Orders Placed This Encounter  ?Procedures  ? Strep Gp B NAA+Rflx  ? POC Urinalysis Dipstick OB  ? ? ?Janyth Pupa, DO ?Attending [place], Faculty Practice ?[facility] ? ? ? ?

## 2021-08-16 LAB — STREP GP B NAA+RFLX: Strep Gp B NAA+Rflx: NEGATIVE

## 2021-08-17 ENCOUNTER — Encounter (HOSPITAL_COMMUNITY): Payer: Self-pay

## 2021-08-17 ENCOUNTER — Telehealth (HOSPITAL_COMMUNITY): Payer: Self-pay | Admitting: *Deleted

## 2021-08-17 ENCOUNTER — Encounter (HOSPITAL_COMMUNITY): Payer: Self-pay | Admitting: *Deleted

## 2021-08-17 LAB — CERVICOVAGINAL ANCILLARY ONLY
Chlamydia: NEGATIVE
Comment: NEGATIVE
Comment: NORMAL
Neisseria Gonorrhea: NEGATIVE

## 2021-08-17 NOTE — Telephone Encounter (Signed)
Preadmission screen  

## 2021-08-18 ENCOUNTER — Other Ambulatory Visit: Payer: Medicaid Other

## 2021-08-18 ENCOUNTER — Ambulatory Visit (INDEPENDENT_AMBULATORY_CARE_PROVIDER_SITE_OTHER): Payer: Medicaid Other | Admitting: *Deleted

## 2021-08-18 ENCOUNTER — Encounter: Payer: Medicaid Other | Admitting: Obstetrics & Gynecology

## 2021-08-18 DIAGNOSIS — O0993 Supervision of high risk pregnancy, unspecified, third trimester: Secondary | ICD-10-CM

## 2021-08-18 DIAGNOSIS — O10919 Unspecified pre-existing hypertension complicating pregnancy, unspecified trimester: Secondary | ICD-10-CM | POA: Diagnosis not present

## 2021-08-18 DIAGNOSIS — Z3A36 36 weeks gestation of pregnancy: Secondary | ICD-10-CM

## 2021-08-18 NOTE — Progress Notes (Signed)
? ?  NURSE VISIT- NST ? ?SUBJECTIVE:  ?Kylie Bailey is a 31 y.o. [phone] female at [redacted]w[redacted]d, here for a NST for pregnancy complicated by [facility].  She reports active fetal movement, contractions: none, vaginal bleeding: none, membranes: intact.  ? ?OBJECTIVE:  ?BP 119/76   Pulse 84   Wt (!) 314 lb 9.6 oz (142.7 kg)   LMP 12/05/2020 (Exact Date)   BMI 45.14 kg/m?   ?Appears well, no apparent distress ? ?No results found for this or any previous visit (from the past 24 hour(s)). ? ?NST: FHR baseline 140 bpm, Variability: moderate, Accelerations:present, Decelerations:  Absent= Cat 1/reactive ?Toco: none  ? ?ASSESSMENT: ?[id number] at [redacted]w[redacted]d with CHTN ?NST reactive ? ?PLAN: ?EFM strip reviewed by Knute Neu, CNM, [place]   ?Recommendations: keep next appointment as scheduled   ? ?Kylie Bailey  ?08/18/2021 ?12:24 PM ? ?

## 2021-08-19 ENCOUNTER — Other Ambulatory Visit: Payer: Self-pay | Admitting: Advanced Practice Midwife

## 2021-08-20 ENCOUNTER — Other Ambulatory Visit: Payer: Self-pay | Admitting: Obstetrics & Gynecology

## 2021-08-20 DIAGNOSIS — O10919 Unspecified pre-existing hypertension complicating pregnancy, unspecified trimester: Secondary | ICD-10-CM

## 2021-08-21 ENCOUNTER — Other Ambulatory Visit: Payer: Medicaid Other

## 2021-08-21 ENCOUNTER — Encounter: Payer: Self-pay | Admitting: Obstetrics & Gynecology

## 2021-08-21 ENCOUNTER — Ambulatory Visit (INDEPENDENT_AMBULATORY_CARE_PROVIDER_SITE_OTHER): Payer: Medicaid Other | Admitting: Obstetrics & Gynecology

## 2021-08-21 ENCOUNTER — Ambulatory Visit (INDEPENDENT_AMBULATORY_CARE_PROVIDER_SITE_OTHER): Payer: Medicaid Other

## 2021-08-21 VITALS — BP 125/82 | HR 88 | Wt 317.4 lb

## 2021-08-21 DIAGNOSIS — Z3A37 37 weeks gestation of pregnancy: Secondary | ICD-10-CM

## 2021-08-21 DIAGNOSIS — O10919 Unspecified pre-existing hypertension complicating pregnancy, unspecified trimester: Secondary | ICD-10-CM | POA: Diagnosis not present

## 2021-08-21 DIAGNOSIS — O0993 Supervision of high risk pregnancy, unspecified, third trimester: Secondary | ICD-10-CM

## 2021-08-21 LAB — POCT URINALYSIS DIPSTICK OB
Blood, UA: NEGATIVE
Glucose, UA: NEGATIVE
Ketones, UA: NEGATIVE
Nitrite, UA: NEGATIVE
POC,PROTEIN,UA: NEGATIVE

## 2021-08-21 NOTE — Progress Notes (Signed)
HIGH-RISK PREGNANCY VISIT Patient name: Kylie Bailey MRN [id number]  Date of birth: 03/21/1991 Chief Complaint:   High Risk Gestation ([place] today; swelling in left foot)  History of Present Illness:   Kylie Bailey is a 31 y.o. [phone] female at [redacted]w[redacted]d with an Estimated Date of Delivery: 09/11/21 being seen today for ongoing management of a high-risk pregnancy complicated by:  -CHTN- on Lab 200mg  bid, BP appropriate Asymptomatic BP has remained stable  Today she reports no complaints.   Contractions: Not present. Vag. Bleeding: None.  Movement: Present. denies leaking of fluid.      06/23/2021   10:11 AM 03/19/2021    9:42 AM 03/09/2021    2:27 PM 08/15/2020    8:50 AM 06/23/2020   10:06 AM  Depression screen PHQ 2/9  Decreased Interest 0 0 1 0 0  Down, Depressed, Hopeless 0 0 0 0 0  PHQ - 2 Score 0 0 1 0 0  Altered sleeping 0 0 0    Tired, decreased energy 0 0 1    Change in appetite 0 0 1    Feeling bad or failure about yourself  0 0 0    Trouble concentrating 0 0 0    Moving slowly or fidgety/restless 0 0 0    Suicidal thoughts 0 0 0    PHQ-9 Score 0 0 3       Current Outpatient Medications  Medication Instructions   acetaminophen (TYLENOL) 500 mg, Oral, Every 6 hours PRN   albuterol (VENTOLIN HFA) 108 (90 Base) MCG/ACT inhaler 1-2 puffs, Inhalation, Every 6 hours PRN   aspirin EC 162 mg, Oral, Daily, Swallow whole.   Blood Pressure Monitor MISC For regular home bp monitoring during pregnancy   calcium carbonate (TUMS - DOSED IN MG ELEMENTAL CALCIUM) 500 MG chewable tablet 2 tablets, Oral, 2 times daily PRN   labetalol (NORMODYNE) 200 MG tablet TAKE 1 TABLET(200 MG) BY MOUTH TWICE DAILY   Prenatal MV & Min w/FA-DHA (PRENATAL ADULT GUMMY/DHA/FA) 0.4-25 MG CHEW 1 tablet, Oral, Daily     Review of Systems:   Pertinent items are noted in HPI Denies abnormal vaginal discharge w/ itching/odor/irritation, headaches, visual changes, shortness of breath, chest  pain, abdominal pain, severe nausea/vomiting, or problems with urination or bowel movements unless otherwise stated above. Pertinent History Reviewed:  Reviewed past medical,surgical, social, obstetrical and family history.  Reviewed problem list, medications and allergies. Physical Assessment:   Vitals:   08/21/21 1025  BP: 125/82  Pulse: 88  Weight: (!) 317 lb 6.4 oz (144 kg)  Body mass index is 45.54 kg/m.           Physical Examination:   General appearance: alert, well appearing, and in no distress  Mental status: normal mood, behavior, speech, dress, motor activity, and thought processes  Skin: warm & dry   Extremities: Edema: Trace    Cardiovascular: normal heart rate noted  Respiratory: normal respiratory effort, no distress  Abdomen: gravid, soft, non-tender  Pelvic: Cervical exam performed  Dilation: 1 Effacement (%): Thick Station: -3  Fetal Status: Fetal Heart Rate (bpm): by [place]   Movement: Present Presentation: Vertex  Fetal Surveillance Testing today: [place]- [place] 37 wks,cephalic,BPP [id number] placenta gr 3, FHR 140 bpm,RI .52,.59,.64=72%,AFI 11.6 cm   Chaperone:  pt declined     Results for orders placed or performed in visit on 08/21/21 (from the past 24 hour(s))  POC Urinalysis Dipstick OB   Collection Time: 08/21/21 10:22 AM  Result  Value Ref Range   Color, UA     Clarity, UA     Glucose, UA Negative Negative   Bilirubin, UA     Ketones, UA neg    Spec Grav, UA     Blood, UA neg    pH, UA     POC,PROTEIN,UA Negative Negative, Trace, Small (1+), Moderate (2+), Large (3+), 4+   Urobilinogen, UA     Nitrite, UA neg    Leukocytes, UA Trace (A) Negative   Appearance     Odor       Assessment & Plan:  High-risk pregnancy: [id number] at [redacted]w[redacted]d with an Estimated Date of Delivery: 09/11/21   1) cHTN -BP well controlled -antepartum testing reviewed today -continue with testing/appointments as scheduled -reviewed preeclampsia precautions -IOL scheduled  5/22  2) Contraception -desires BTL- discussed we will try and complete in hospital; however, it may likely be scheduled at 5wk postpartum visit  Meds: No orders of the defined types were placed in this encounter.   Labs/procedures today: BPP/growth  Treatment Plan:  as aboved  Reviewed: Term labor symptoms and general obstetric precautions including but not limited to vaginal bleeding, contractions, leaking of fluid and fetal movement were reviewed in detail with the patient.  All questions were answered. Pt has home bp cuff. Check bp weekly, let us know if >140/90.   Follow-up: Return in about 1 week (around 08/28/2021) for 1 wk from next Monday- BP check with RN then 5wk PP visit.   Future Appointments  Date Time Provider [place]  08/24/2021  7:15 AM MC-LD [place] [facility]-IN[facility] None  03/19/2022  8:00 AM Paseda, Dewaine Conger, FNP R[facility]-RPC RPC    Orders Placed This Encounter  Procedures   POC Urinalysis Dipstick OB    Kylie Pupa, DO Attending [place], [facility] for [employer], [place]

## 2021-08-21 NOTE — Progress Notes (Signed)
[place] 37 wks,cephalic,BPP 8/8,posterior placenta gr 3, FHR 140 bpm,RI .52,.59,.64=72%,AFI 11.6 cm

## 2021-08-22 ENCOUNTER — Other Ambulatory Visit: Payer: Self-pay | Admitting: Advanced Practice Midwife

## 2021-08-24 ENCOUNTER — Encounter (HOSPITAL_COMMUNITY): Payer: Self-pay | Admitting: Family Medicine

## 2021-08-24 ENCOUNTER — Inpatient Hospital Stay (HOSPITAL_COMMUNITY): Payer: Medicaid Other

## 2021-08-24 ENCOUNTER — Other Ambulatory Visit: Payer: Self-pay

## 2021-08-24 ENCOUNTER — Inpatient Hospital Stay (HOSPITAL_COMMUNITY)
Admission: AD | Admit: 2021-08-24 | Discharge: 2021-08-26 | DRG: 807 | Disposition: A | Discharge status: Home / Self Care | Payer: Medicaid Other | Attending: Obstetrics and Gynecology | Admitting: Obstetrics and Gynecology

## 2021-08-24 DIAGNOSIS — O26893 Other specified pregnancy related conditions, third trimester: Secondary | ICD-10-CM | POA: Diagnosis not present

## 2021-08-24 DIAGNOSIS — Z3A37 37 weeks gestation of pregnancy: Secondary | ICD-10-CM

## 2021-08-24 DIAGNOSIS — O99210 Obesity complicating pregnancy, unspecified trimester: Secondary | ICD-10-CM | POA: Diagnosis present

## 2021-08-24 DIAGNOSIS — O99334 Smoking (tobacco) complicating childbirth: Secondary | ICD-10-CM | POA: Diagnosis present

## 2021-08-24 DIAGNOSIS — O1092 Unspecified pre-existing hypertension complicating childbirth: Secondary | ICD-10-CM | POA: Diagnosis not present

## 2021-08-24 DIAGNOSIS — O1002 Pre-existing essential hypertension complicating childbirth: Secondary | ICD-10-CM | POA: Diagnosis not present

## 2021-08-24 DIAGNOSIS — F17210 Nicotine dependence, cigarettes, uncomplicated: Secondary | ICD-10-CM | POA: Diagnosis present

## 2021-08-24 DIAGNOSIS — R87619 Unspecified abnormal cytological findings in specimens from cervix uteri: Secondary | ICD-10-CM | POA: Diagnosis present

## 2021-08-24 DIAGNOSIS — F17200 Nicotine dependence, unspecified, uncomplicated: Secondary | ICD-10-CM | POA: Diagnosis present

## 2021-08-24 DIAGNOSIS — O99214 Obesity complicating childbirth: Secondary | ICD-10-CM | POA: Diagnosis not present

## 2021-08-24 DIAGNOSIS — F32A Depression, unspecified: Secondary | ICD-10-CM | POA: Diagnosis present

## 2021-08-24 DIAGNOSIS — O0990 Supervision of high risk pregnancy, unspecified, unspecified trimester: Secondary | ICD-10-CM

## 2021-08-24 DIAGNOSIS — O10919 Unspecified pre-existing hypertension complicating pregnancy, unspecified trimester: Principal | ICD-10-CM | POA: Diagnosis present

## 2021-08-24 DIAGNOSIS — Z7982 Long term (current) use of aspirin: Secondary | ICD-10-CM | POA: Diagnosis not present

## 2021-08-24 DIAGNOSIS — O285 Abnormal chromosomal and genetic finding on antenatal screening of mother: Secondary | ICD-10-CM | POA: Diagnosis present

## 2021-08-24 LAB — CBC
HCT: 35.5 % — ABNORMAL LOW (ref 36.0–46.0)
HCT: 36.2 % (ref 36.0–46.0)
Hemoglobin: 12.7 g/dL (ref 12.0–15.0)
Hemoglobin: 12.9 g/dL (ref 12.0–15.0)
MCH: 32.8 pg (ref 26.0–34.0)
MCH: 32.5 pg (ref 26.0–34.0)
MCHC: 35.8 g/dL (ref 30.0–36.0)
MCHC: 35.6 g/dL (ref 30.0–36.0)
MCV: 91.7 fL (ref 80.0–100.0)
MCV: 91.2 fL (ref 80.0–100.0)
Platelets: 181 10*3/uL (ref 150–400)
Platelets: 188 10*3/uL (ref 150–400)
RBC: 3.87 MIL/uL (ref 3.87–5.11)
RBC: 3.97 MIL/uL (ref 3.87–5.11)
RDW: 13.2 % (ref 11.5–15.5)
RDW: 13.3 % (ref 11.5–15.5)
WBC: 10.3 10*3/uL (ref 4.0–10.5)
WBC: 10.6 10*3/uL — ABNORMAL HIGH (ref 4.0–10.5)
nRBC: 0 % (ref 0.0–0.2)
nRBC: 0 % (ref 0.0–0.2)

## 2021-08-24 LAB — COMPREHENSIVE METABOLIC PANEL
ALT: 15 U/L (ref 0–44)
AST: 20 U/L (ref 15–41)
Albumin: 2.4 g/dL — ABNORMAL LOW (ref 3.5–5.0)
Alkaline Phosphatase: 120 U/L (ref 38–126)
Anion gap: 5 (ref 5–15)
BUN: 7 mg/dL (ref 6–20)
CO2: 20 mmol/L — ABNORMAL LOW (ref 22–32)
Calcium: 8.6 mg/dL — ABNORMAL LOW (ref 8.9–10.3)
Chloride: 111 mmol/L (ref 98–111)
Creatinine, Ser: 0.69 mg/dL (ref 0.44–1.00)
GFR, Estimated: >60 mL/min (ref >60)
Glucose, Bld: 119 mg/dL — ABNORMAL HIGH (ref 70–99)
Potassium: 3.6 mmol/L (ref 3.5–5.1)
Sodium: 136 mmol/L (ref 135–145)
Total Bilirubin: 0.6 mg/dL (ref 0.3–1.2)
Total Protein: 5.1 g/dL — ABNORMAL LOW (ref 6.5–8.1)

## 2021-08-24 LAB — PROTEIN / CREATININE RATIO, URINE
Creatinine, Urine: 226.64 mg/dL
Protein Creatinine Ratio: 0.09 mg/mg{Cre} (ref 0.00–0.15)
Total Protein, Urine: 20 mg/dL

## 2021-08-24 LAB — TYPE AND SCREEN
ABO/RH(D): A POS
Antibody Screen: NEGATIVE

## 2021-08-24 MED ORDER — LACTATED RINGERS IV SOLN: INTRAVENOUS | Status: DC

## 2021-08-24 MED ORDER — ACETAMINOPHEN 325 MG PO TABS: 650.0000 mg | ORAL_TABLET | ORAL | Status: DC | PRN

## 2021-08-24 MED ORDER — TRANEXAMIC ACID-NACL 1000-0.7 MG/100ML-% IV SOLN
1000.0000 mg | INTRAVENOUS | Status: AC
  Administered 2021-08-25: 1000 mg via INTRAVENOUS
  Filled 2021-08-24: qty 100

## 2021-08-24 MED ORDER — LABETALOL HCL 200 MG PO TABS
200.0000 mg | ORAL_TABLET | Freq: Two times a day (BID) | ORAL | Status: DC
  Administered 2021-08-24: 200 mg via ORAL
  Filled 2021-08-24: qty 1

## 2021-08-24 MED ORDER — OXYTOCIN-SODIUM CHLORIDE 30-0.9 UT/500ML-% IV SOLN
1.0000 m[IU]/min | INTRAVENOUS | Status: DC
  Administered 2021-08-24: 2 m[IU]/min via INTRAVENOUS
  Filled 2021-08-24: qty 500

## 2021-08-24 MED ORDER — LABETALOL HCL 200 MG PO TABS: 200.0000 mg | ORAL_TABLET | Freq: Two times a day (BID) | ORAL | Status: DC

## 2021-08-24 MED ORDER — OXYTOCIN-SODIUM CHLORIDE 30-0.9 UT/500ML-% IV SOLN
2.5000 [IU]/h | INTRAVENOUS | Status: DC
  Administered 2021-08-25 (×2): 2.5 [IU]/h via INTRAVENOUS
  Filled 2021-08-24: qty 500

## 2021-08-24 MED ORDER — ONDANSETRON HCL 4 MG/2ML IJ SOLN: 4.0000 mg | Freq: Four times a day (QID) | INTRAMUSCULAR | Status: DC | PRN

## 2021-08-24 MED ORDER — LIDOCAINE HCL (PF) 1 % IJ SOLN: 30.0000 mL | INTRAMUSCULAR | Status: DC | PRN

## 2021-08-24 MED ORDER — OXYTOCIN BOLUS FROM INFUSION
333.0000 mL | Freq: Once | INTRAVENOUS | Status: AC
  Administered 2021-08-25: 333 mL via INTRAVENOUS

## 2021-08-24 MED ORDER — TERBUTALINE SULFATE 1 MG/ML IJ SOLN: 0.2500 mg | Freq: Once | INTRAMUSCULAR | Status: DC | PRN

## 2021-08-24 MED ORDER — OXYCODONE-ACETAMINOPHEN 5-325 MG PO TABS: 2.0000 | ORAL_TABLET | ORAL | Status: DC | PRN

## 2021-08-24 MED ORDER — MISOPROSTOL 50MCG HALF TABLET
50.0000 ug | ORAL_TABLET | ORAL | Status: DC | PRN
  Administered 2021-08-24: 50 ug via BUCCAL
  Filled 2021-08-24: qty 1

## 2021-08-24 MED ORDER — LACTATED RINGERS IV SOLN: 500.0000 mL | INTRAVENOUS | Status: DC | PRN

## 2021-08-24 MED ORDER — ALBUTEROL (5 MG/ML) CONTINUOUS INHALATION SOLN: 2.5000 mg | INHALATION_SOLUTION | Freq: Four times a day (QID) | RESPIRATORY_TRACT | Status: DC | PRN

## 2021-08-24 MED ORDER — OXYCODONE-ACETAMINOPHEN 5-325 MG PO TABS: 1.0000 | ORAL_TABLET | ORAL | Status: DC | PRN

## 2021-08-24 MED ORDER — FENTANYL CITRATE (PF) 100 MCG/2ML IJ SOLN: 50.0000 ug | INTRAMUSCULAR | Status: DC | PRN

## 2021-08-24 MED ORDER — SOD CITRATE-CITRIC ACID 500-334 MG/5ML PO SOLN: 30.0000 mL | ORAL | Status: DC | PRN

## 2021-08-24 NOTE — Progress Notes (Signed)
Labor Progress Note Kylie Bailey is a 31 y.o. [id number] at [redacted]w[redacted]d presented for IOL for chronic HTN.  S: Patient is resting comfortably. On pitocin. Open to AROM.  O:  BP (!) 152/68   Pulse 63   Temp 100 F (37.8 C) (Axillary)   Resp 16   Ht 5' 9.5" (1.765 m)   Wt (!) 143.8 kg   LMP 12/05/2020 (Exact Date)   BMI 46.14 kg/m  EFM: baseline 135/moderate variability/+accels, no decels Toco: q 3-4 minutes, mild  CVE: Dilation: 2 Effacement (%): 50 Cervical Position: Posterior Station: -2 Presentation: Vertex Exam by:: Dr. Miquel Dunn   A&P: 31 y.o. [id number] [redacted]w[redacted]d presenting for IOL for cHTN.  #Labor: SVE unchanged, now on pitocin 12.  S/p Cytotec x1. Head well applied, discussed risks/benefits of AROM including cord prolapse, she is agreeable to proceed. AROM with moderate amount of clear fluid, head well applied. #Pain: epidural when desires #FWB: cat I, reassuring #GBS negative  #cHTN- asymptomatic, pre-E labs negative. Continued home labetalol, pressures remain mild range.  Billey Co, MD Center for [facility], [facility] 7:42 PM

## 2021-08-24 NOTE — Progress Notes (Signed)
Labor Progress Note Kylie Bailey is a 31 y.o. [id number] at 108w3d presented for IOL for chronic HTN.  S: Patient is resting comfortably. Felt some cramping with cytotec but not much.  O:  BP (!) 151/68   Pulse 70   Temp 100 F (37.8 C) (Axillary)   Resp 16   Ht 5' 9.5" (1.765 m)   Wt (!) 143.8 kg   LMP 12/05/2020 (Exact Date)   BMI 46.14 kg/m  EFM: baseline 135/moderate variability/+accels, no decels Toco: q4-6 minutes, mild  CVE: Dilation: 2 Effacement (%): 50 Cervical Position: Posterior Station: -2 Presentation: Vertex Exam by:: k fields, rn   A&P: 31 y.o. [id number] [redacted]w[redacted]d presenting for IOL for cHTN.  #Labor: Progressing well. S/p Cytotec x1. Will start pitocin 2x2, she is not interested in FB.  #Pain: epidural when desires #FWB: cat I, reassuring #GBS negative  #cHTN- asymptomatic, pre-E labs negative. Continued home labetalol, pressures remain mild range.  Lenoria Chime, MD Center for [place] 4:23 PM

## 2021-08-24 NOTE — H&P (Signed)
OBSTETRIC ADMISSION HISTORY AND PHYSICAL  Kylie Bailey is a 31 y.o. female [id number] with IUP at [redacted]w[redacted]d by LMP c/w 10wk sono presenting for IOL. She reports +FMs, No LOF, no VB, no blurry vision, headaches or peripheral edema, and RUQ pain.  She plans on bottle and breast feeding. She requests BTL for birth control. 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, FOB not getting tested Abnormal pap smear- needs repeat colpo 8 wks PP  Past Medical History: Past Medical History:  Diagnosis Date   Anxiety    Bipolar disorder ([place])    Bulimia    Depression    GERD (gastroesophageal reflux disease)    Hypertension    Migraines    Panic attacks    Pregnancy induced hypertension    Seasonal allergies     Past Surgical History: Past Surgical History:  Procedure Laterality Date   INDUCED ABORTION  07/20/2020   TONSILLECTOMY     TYMPANOSTOMY TUBE PLACEMENT Bilateral     Obstetrical History: OB History     Gravida  4   Para  2   Term  2   Preterm      AB  1   Living  2      SAB      IAB      Ectopic      Multiple  0   Live Births  2           Social History Social History   Socioeconomic History   Marital status: Married    Spouse name: Travanti   Number of children: 2   Years of education: High School   Highest education level: 12th grade  Occupational History   Occupation: Stay Home Mom  Tobacco Use   Smoking status: Every Day    Years: 5.00    Types: Cigarettes   Smokeless tobacco: Never   Tobacco comments:    3 cigarettes daily   Vaping Use   Vaping Use: Never used  Substance and Sexual Activity   Alcohol use: Not Currently    Comment: 1 glass of wine 2 nights per week    Drug use: No   Sexual activity: Yes    Birth control/protection: None  Other Topics Concern   Not on file  Social History Narrative   Lives at home with her husband and two children.    Social Determinants of Health   Financial Resource Strain: Low Risk    Difficulty of Paying Living Expenses: Not very hard  Food Insecurity: No Food Insecurity   Worried About Charity fundraiser in the Last Year: Never true   Ran Out of Food in the Last Year: Never true  Transportation Needs: No Transportation Needs   Lack of Transportation (Medical): No   Lack of Transportation (Non-Medical): No  Physical Activity: Insufficiently Active   Days of Exercise per Week: 2 days   Minutes of Exercise per Session: 30 min  Stress: No Stress Concern Present   Feeling of Stress : Not at all  Social Connections: Socially Isolated   Frequency of Communication with Friends and Family: Twice a week   Frequency of Social Gatherings with Friends and Family: Never   Attends Religious Services:  Never   Active Member of Clubs or Organizations: No   Attends Archivist Meetings: Never   Marital Status: Married    Family History: Family History  Problem Relation Age of Onset   Hypertension Mother    Heart attack Mother    Drug abuse Father    High Cholesterol Brother    Hypertension Brother    Cancer Daughter        bone cancer   Heart disease Maternal Uncle    Diabetes Paternal Grandmother    High blood pressure Paternal Grandmother    Heart disease Paternal Grandmother    Cancer Paternal Grandmother    Colon cancer Neg Hx    Breast cancer Neg Hx    Lung cancer Neg Hx     Allergies: Allergies  Allergen Reactions   Augmentin [Amoxicillin-Pot Clavulanate] Rash   Amoxicillin Hives, Itching and Rash    Pt denies allergies to latex, iodine, or shellfish.  Medications Prior to Admission  Medication Sig Dispense Refill Last Dose   acetaminophen (TYLENOL) 500 MG tablet Take 1 tablet (500  mg total) by mouth every 6 (six) hours as needed. 30 tablet 0    albuterol (VENTOLIN HFA) 108 (90 Base) MCG/ACT inhaler Inhale 1-2 puffs into the lungs every 6 (six) hours as needed for wheezing or shortness of breath. 1 each 1    aspirin 81 MG EC tablet Take 2 tablets (162 mg total) by mouth daily. Swallow whole. 180 tablet 2    Blood Pressure Monitor MISC For regular home bp monitoring during pregnancy 1 each 0    calcium carbonate (TUMS - DOSED IN MG ELEMENTAL CALCIUM) 500 MG chewable tablet Chew 2 tablets by mouth 2 (two) times daily as needed for indigestion or heartburn.      labetalol (NORMODYNE) 200 MG tablet TAKE 1 TABLET(200 MG) BY MOUTH TWICE DAILY 60 tablet 3    Prenatal MV & Min w/FA-DHA (PRENATAL ADULT GUMMY/DHA/FA) 0.4-25 MG CHEW Chew 1 tablet by mouth daily. 90 tablet 3      Review of Systems   All systems reviewed and negative except as stated in HPI  Blood pressure (!) 144/70, pulse 80, temperature 98 F (36.7 C), temperature source Oral, resp. rate 16, height 5' 9.5" (1.765 m), weight (!) 143.8 kg, last menstrual period 12/05/2020. General appearance: alert, cooperative, and appears stated age Lungs: normal WOB Abdomen: gravid, non-tender Extremities: Homans sign is negative, no sign of DVT Presentation: cephalic Fetal monitoringBaseline: 130s bpm, Variability: Good {> 6 bpm), Accelerations: Reactive, and Decelerations: Absent Uterine activityNone     Prenatal labs: ABO, Rh: --/--/PENDING (05/22 1117) Antibody: PENDING (05/22 1117) Rubella: 6.23 (12/05 1652) RPR: Non Reactive (03/21 0809)  HBsAg: Negative (12/05 1652)  HIV: Non Reactive (03/21 0809)  GBS: --[place] (05/12 1534)  2 hr Glucola: passed Genetic screening:  integrated screen negative, normal nuchal translucency, [place] carrier for medium chain Acyl-CoA dehydrogenase deficiency, LR NIPS Anatomy US: normal  Prenatal Transfer Tool  Maternal Diabetes: No Genetic Screening: Abnormal:  Results:  Other:medium chain Acyl-CoA dehydrogenase deficiency carrier Maternal Ultrasounds/Referrals: Normal Fetal Ultrasounds or other Referrals:  None Maternal Substance Abuse:  No Significant Maternal Medications:  None Significant Maternal Lab Results: Group B Strep negative  Results for orders placed or performed during the hospital encounter of 08/24/21 (from the past 24 hour(s))  CBC   Collection Time: 08/24/21 11:17 AM  Result Value Ref Range   WBC 10.3 4.0 - 10.5 K/uL   RBC 3.87 3.87 -  5.11 MIL/uL   Hemoglobin 12.7 12.0 - 15.0 g/dL   HCT 35.5 (L) 36.0 - 46.0 %   MCV 91.7 80.0 - 100.0 fL   MCH 32.8 26.0 - 34.0 pg   MCHC 35.8 30.0 - 36.0 g/dL   RDW 13.2 11.5 - 15.5 %   Platelets 181 150 - 400 K/uL   nRBC 0.0 0.0 - 0.2 %  Type and screen [place]   Collection Time: 08/24/21 11:17 AM  Result Value Ref Range   ABO/RH(D) PENDING    Antibody Screen PENDING    Sample Expiration      08/27/2021,2359 Performed at [facility], [place] [address], [place], [place] [address]     Patient Active Problem List   Diagnosis Date Noted   Maternal obesity affecting pregnancy, antepartum 08/24/2021   Abnormal chromosomal and genetic finding on antenatal screening mother 03/27/2021   Constipation 03/19/2021   Abnormal Pap smear of cervix 03/16/2021   Supervision of high-risk pregnancy 03/09/2021   Smoker 03/09/2021   Upper respiratory tract infection 08/15/2020   Anxiety and depression 04/02/2020   Chronic hypertension affecting pregnancy 06/27/2018   Bipolar disorder ([place]) 10/07/2017   Skin tag 10/07/2017   Seasonal allergies 08/18/2017   Essential hypertension 12/03/2016   Panic attacks 12/11/2015   Migraine 12/11/2015   Obesity 12/11/2015    Assessment/Plan:  REITA MANELLA is a 31 y.o. [id number] at [redacted]w[redacted]d here for IOL for chronic HTN.  #Labor: SVE 1.5 and thick, cytotec x1 placed. Patient declines FB. Consider pitocin vs cytotec at next check depending on  cervical effacement. #Pain: Epidural when desires #FWB: Cat I, reassuring #ID:  GBS negative #MOF: bottle and breast #MOC: BTL (paper signed and scanned in chart 3.28.23), consider postpartum vs interval BTL #Circ:  Declines  #Chronic HTN- asyptomatic today, pre-E labs negative, continue home labetalol and monitor BP closely.  #Abnormal pap smear- needs repeat colpo 8 wks postpartum.  Lenoria Chime, MD  [facility] 08/24/2021, 11:37 AM    Attestation of Attending Supervision of Obstetric Fellow: Evaluation and management procedures were performed by the Obstetric Fellow under my supervision and collaboration.  I have reviewed the Obstetric Fellow's note and chart, and I agree with the management and plan. I have also made any necessary editorial changes.   Clarnce Flock, MD Family Medicine Attending, [facility] for [facility], [place]

## 2021-08-24 NOTE — Progress Notes (Signed)
Labor Progress Note DELORIA BROADEN is a 31 y.o. [id number] at [redacted]w[redacted]d presented for IOL for CHTN  S:  Feeling more uncomfortable with ctx. Declines pain meds.  O:  BP (!) 149/70   Pulse 62   Temp 98.3 F (36.8 C) (Oral)   Resp 18   Ht 5' 9.5" (1.765 m)   Wt (!) 143.8 kg   LMP 12/05/2020 (Exact Date)   BMI 46.14 kg/m  EFM: baseline 130 bpm/ mod variability/ + accels/ rare decels  Toco/IUPC: q3 SVE: Dilation: 3 Effacement (%): 70 Cervical Position: Posterior Station: -2 Presentation: Vertex Exam by:: Verina Galeno, CNM Pitocin: 16 mu/min  A/P: 31 y.o. [id number] [redacted]w[redacted]d  1. Labor: latent 2. FWB: Cat II 3. Pain: analgesia/anesthesia/NO prn 4. CHTN: stable  Continue Pitocin. Anticipate labor progress and SVD.  Julianne Handler, CNM 11:17 PM

## 2021-08-25 ENCOUNTER — Other Ambulatory Visit: Payer: Medicaid Other | Admitting: Women's Health

## 2021-08-25 ENCOUNTER — Encounter (HOSPITAL_COMMUNITY): Payer: Self-pay | Admitting: Family Medicine

## 2021-08-25 ENCOUNTER — Inpatient Hospital Stay (HOSPITAL_COMMUNITY): Payer: Medicaid Other | Admitting: Anesthesiology

## 2021-08-25 DIAGNOSIS — Z3A37 37 weeks gestation of pregnancy: Secondary | ICD-10-CM | POA: Diagnosis not present

## 2021-08-25 DIAGNOSIS — O10919 Unspecified pre-existing hypertension complicating pregnancy, unspecified trimester: Secondary | ICD-10-CM | POA: Diagnosis not present

## 2021-08-25 DIAGNOSIS — O1092 Unspecified pre-existing hypertension complicating childbirth: Secondary | ICD-10-CM | POA: Diagnosis not present

## 2021-08-25 DIAGNOSIS — O164 Unspecified maternal hypertension, complicating childbirth: Secondary | ICD-10-CM | POA: Diagnosis not present

## 2021-08-25 LAB — RPR: RPR Ser Ql: NONREACTIVE

## 2021-08-25 MED ORDER — EPHEDRINE 5 MG/ML INJ: 10.0000 mg | INTRAVENOUS | Status: DC | PRN

## 2021-08-25 MED ORDER — SENNOSIDES-DOCUSATE SODIUM 8.6-50 MG PO TABS
2.0000 | ORAL_TABLET | ORAL | Status: DC
  Administered 2021-08-25 – 2021-08-26 (×2): 2 via ORAL
  Filled 2021-08-25 (×2): qty 2

## 2021-08-25 MED ORDER — MISOPROSTOL 200 MCG PO TABS
ORAL_TABLET | ORAL | Status: AC
  Filled 2021-08-25: qty 4

## 2021-08-25 MED ORDER — PHENYLEPHRINE 80 MCG/ML (10ML) SYRINGE FOR IV PUSH (FOR BLOOD PRESSURE SUPPORT): 80.0000 ug | PREFILLED_SYRINGE | INTRAVENOUS | Status: DC | PRN

## 2021-08-25 MED ORDER — PHENYLEPHRINE 80 MCG/ML (10ML) SYRINGE FOR IV PUSH (FOR BLOOD PRESSURE SUPPORT)
80.0000 ug | PREFILLED_SYRINGE | INTRAVENOUS | Status: DC | PRN
  Filled 2021-08-25: qty 10

## 2021-08-25 MED ORDER — ONDANSETRON HCL 4 MG PO TABS: 4.0000 mg | ORAL_TABLET | ORAL | Status: DC | PRN

## 2021-08-25 MED ORDER — ONDANSETRON HCL 4 MG/2ML IJ SOLN: 4.0000 mg | INTRAMUSCULAR | Status: DC | PRN

## 2021-08-25 MED ORDER — LABETALOL HCL 200 MG PO TABS
200.0000 mg | ORAL_TABLET | Freq: Two times a day (BID) | ORAL | Status: DC
  Administered 2021-08-25 (×2): 200 mg via ORAL
  Filled 2021-08-25 (×3): qty 1

## 2021-08-25 MED ORDER — WITCH HAZEL-GLYCERIN EX PADS: 1.0000 "application " | MEDICATED_PAD | CUTANEOUS | Status: DC | PRN

## 2021-08-25 MED ORDER — DIPHENHYDRAMINE HCL 50 MG/ML IJ SOLN: 12.5000 mg | INTRAMUSCULAR | Status: DC | PRN

## 2021-08-25 MED ORDER — MEASLES, MUMPS & RUBELLA VAC IJ SOLR: 0.5000 mL | Freq: Once | INTRAMUSCULAR | Status: DC

## 2021-08-25 MED ORDER — MISOPROSTOL 200 MCG PO TABS
800.0000 ug | ORAL_TABLET | Freq: Once | ORAL | Status: AC
  Administered 2021-08-25: 800 ug via BUCCAL

## 2021-08-25 MED ORDER — COCONUT OIL OIL: 1.0000 "application " | TOPICAL_OIL | Status: DC | PRN

## 2021-08-25 MED ORDER — IBUPROFEN 600 MG PO TABS
600.0000 mg | ORAL_TABLET | Freq: Four times a day (QID) | ORAL | Status: DC
  Administered 2021-08-25 – 2021-08-26 (×5): 600 mg via ORAL
  Filled 2021-08-25 (×5): qty 1

## 2021-08-25 MED ORDER — DIBUCAINE (PERIANAL) 1 % EX OINT: 1.0000 "application " | TOPICAL_OINTMENT | CUTANEOUS | Status: DC | PRN

## 2021-08-25 MED ORDER — METHYLERGONOVINE MALEATE 0.2 MG/ML IJ SOLN: 0.2000 mg | Freq: Once | INTRAMUSCULAR | Status: AC

## 2021-08-25 MED ORDER — TETANUS-DIPHTH-ACELL PERTUSSIS 5-2.5-18.5 LF-MCG/0.5 IM SUSY: 0.5000 mL | PREFILLED_SYRINGE | Freq: Once | INTRAMUSCULAR | Status: DC

## 2021-08-25 MED ORDER — LACTATED RINGERS IV SOLN
500.0000 mL | Freq: Once | INTRAVENOUS | Status: AC
  Administered 2021-08-25: 500 mL via INTRAVENOUS

## 2021-08-25 MED ORDER — METHYLERGONOVINE MALEATE 0.2 MG/ML IJ SOLN
INTRAMUSCULAR | Status: AC
  Administered 2021-08-25: 0.2 mg via INTRAMUSCULAR
  Filled 2021-08-25: qty 1

## 2021-08-25 MED ORDER — DIPHENHYDRAMINE HCL 25 MG PO CAPS: 25.0000 mg | ORAL_CAPSULE | Freq: Four times a day (QID) | ORAL | Status: DC | PRN

## 2021-08-25 MED ORDER — BENZOCAINE-MENTHOL 20-0.5 % EX AERO: 1.0000 "application " | INHALATION_SPRAY | CUTANEOUS | Status: DC | PRN

## 2021-08-25 MED ORDER — SIMETHICONE 80 MG PO CHEW: 80.0000 mg | CHEWABLE_TABLET | ORAL | Status: DC | PRN

## 2021-08-25 MED ORDER — LIDOCAINE HCL (PF) 1 % IJ SOLN
INTRAMUSCULAR | Status: DC | PRN
  Administered 2021-08-25 (×2): 5 mL via EPIDURAL

## 2021-08-25 MED ORDER — FENTANYL-BUPIVACAINE-NACL 0.5-0.125-0.9 MG/250ML-% EP SOLN
12.0000 mL/h | EPIDURAL | Status: DC | PRN
  Administered 2021-08-25: 12 mL/h via EPIDURAL
  Filled 2021-08-25: qty 250

## 2021-08-25 MED ORDER — PRENATAL MULTIVITAMIN CH
1.0000 | ORAL_TABLET | Freq: Every day | ORAL | Status: DC
  Administered 2021-08-25 – 2021-08-26 (×2): 1 via ORAL
  Filled 2021-08-25 (×2): qty 1

## 2021-08-25 MED ORDER — ACETAMINOPHEN 325 MG PO TABS: 650.0000 mg | ORAL_TABLET | ORAL | Status: DC | PRN

## 2021-08-25 NOTE — Lactation Note (Signed)
This note was copied from a baby's chart. Lactation Consultation Note  Patient Name: Kylie Bailey GURKY'H Date: 08/25/2021 Reason for consult: Initial assessment;Breastfeeding assistance;Early term 37-38.6wks Age:29 hours  Per mom baby has fed twice since birth. She states that she is excited because he is doing well at the breast because she did not have much success with her other children.   Per mom she would like to feed her baby breast milk and formula and she hopes that this experience will be different. Mom states that she has been taught how to hand express and LC encouraged her to hand express and feed the milk back to her baby. LC also encouraged mom to watch baby's feeding cues and breast feed when he shows cues.   LC reviewed lactation services brochure and encouraged the parents to call for assistance when baby is ready to feed.   Current plan:   Mom will breast feed baby first before giving formula.  Mom will feed according to feeding cues 8+ times per day.  Mom will hand express for stimulation and feed any milk that she gets back to baby.  Mom will call for breastfeeding assistance.  Maternal Data Has patient been taught Hand Expression?: Yes Does the patient have breastfeeding experience prior to this delivery?: Yes How long did the patient breastfeed?: Per mom she tried with her first and did not make milk. With her second she breastfed for a week before finding out that baby had a tongue tie.  Feeding Mother's Current Feeding Choice: Breast Milk and Formula  LATCH Score                    Lactation Tools Discussed/Used    Interventions Interventions: Breast feeding basics reviewed;Hand express;Education;LC Services brochure  Discharge    Consult Status Consult Status: Follow-up Date: 08/26/21 Follow-up type: In-patient    Kylie Bailey 08/25/2021, 2:05 PM

## 2021-08-25 NOTE — Lactation Note (Signed)
This note was copied from a baby's chart. Lactation Consultation Note  Patient Name: Boy Brigit Doke ENIDP'O Date: 08/25/2021 Reason for consult: L&D Initial assessment;Early term 37-38.6wks Age:29 hours  P3, Baby latched upon entering.  Mother states she had difficulty breastfeeding her first two children. Lactation to follow up on MBU.    LATCH Score Latch: Grasps breast easily, tongue down, lips flanged, rhythmical sucking.  Audible Swallowing: A few with stimulation  Type of Nipple: Everted at rest and after stimulation  Comfort (Breast/Nipple): Soft / non-tender  Hold (Positioning): Assistance needed to correctly position infant at breast and maintain latch.  LATCH Score: 8   Interventions Interventions: Education  Consult Status Consult Status: Follow-up from L&D    Dahlia Byes [facility] 08/25/2021, 7:28 AM

## 2021-08-25 NOTE — Discharge Summary (Signed)
Postpartum Discharge Summary  Date of Service updated***     Patient Name: Kylie Bailey DOB: Aug 07, 1990 MRN: [id number]  Date of admission: 08/24/2021 Delivery date:08/25/2021  Delivering provider: Julianne Handler  Date of discharge: 08/25/2021  Admitting diagnosis: Chronic hypertension affecting pregnancy [O10.919] Intrauterine pregnancy: [redacted]w[redacted]d     Secondary diagnosis:  Principal Problem:   Chronic hypertension affecting pregnancy Active Problems:   Anxiety and depression   Supervision of high-risk pregnancy   Smoker   Abnormal Pap smear of cervix   Abnormal chromosomal and genetic finding on antenatal screening mother   Maternal obesity affecting pregnancy, antepartum   SVD (spontaneous vaginal delivery)  Additional problems: CHTN, abnormal pap, medium chain Acyl-CoA dehydrogenase    Discharge diagnosis: Term Pregnancy Delivered and CHTN                                              Post partum procedures:{Postpartum procedures:23558} Augmentation: AROM, Pitocin, and Cytotec Complications: None  Hospital course: Induction of Labor With Vaginal Delivery   31 y.o. yo [id number] at [redacted]w[redacted]d was admitted to the hospital 08/24/2021 for induction of labor.  Indication for induction:  CHTN .  Patient had an uncomplicated labor course as follows: Membrane Rupture Time/Date: 7:39 PM ,08/24/2021   Delivery Method:Vaginal, Spontaneous  Episiotomy: None  Lacerations:    Details of delivery can be found in separate delivery note.  Patient had a routine postpartum course. Patient is discharged home 08/25/21.  Newborn Data: Birth date:08/25/2021  Birth time:6:39 AM  Gender:Female  Living status:Living  Apgars: ,  Weight:   Magnesium Sulfate received: No BMZ received: No Rhophylac:N/A MMR:N/A T-DaP:Given prenatally Flu: No Transfusion:{Transfusion received:30440034}  Physical exam  Vitals:   08/25/21 0500 08/25/21 0530 08/25/21 0600 08/25/21 0630  BP: 139/67 123/64 130/68  (!) 148/101  Pulse: 65 (!) 57 (!) 56 90  Resp:   17   Temp:      TempSrc:      SpO2: 97% 98% 96% 100%  Weight:      Height:       General: {Exam; general:21111117} Lochia: {Desc; appropriate/inappropriate:30686::"appropriate"} Uterine Fundus: {Desc; firm/soft:30687} Incision: {Exam; incision:21111123} DVT Evaluation: {Exam; dvt:2111122} Labs: Lab Results  Component Value Date   WBC 10.6 (H) 08/24/2021   HGB 12.9 08/24/2021   HCT 36.2 08/24/2021   MCV 91.2 08/24/2021   PLT 188 08/24/2021      Latest Ref Rng & Units 08/24/2021   11:17 AM  CMP  Glucose 70 - 99 mg/dL 119    BUN 6 - 20 mg/dL 7    Creatinine 0.44 - 1.00 mg/dL 0.69    Sodium 135 - 145 mmol/L 136    Potassium 3.5 - 5.1 mmol/L 3.6    Chloride 98 - 111 mmol/L 111    CO2 22 - 32 mmol/L 20    Calcium 8.9 - 10.3 mg/dL 8.6    Total Protein 6.5 - 8.1 g/dL 5.1    Total Bilirubin 0.3 - 1.2 mg/dL 0.6    Alkaline Phos 38 - 126 U/L 120    AST 15 - 41 U/L 20    ALT 0 - 44 U/L 15     Edinburgh Score:    08/10/2018   10:59 AM  Edinburgh Postnatal Depression Scale Screening Tool  I have been able to laugh and see the funny side of things. 0  I have looked forward with enjoyment to things. 0  I have blamed myself unnecessarily when things went wrong. 0  I have been anxious or worried for no good reason. 1  I have felt scared or panicky for no good reason. 1  Things have been getting on top of me. 0  I have been so unhappy that I have had difficulty sleeping. 0  I have felt sad or miserable. 0  I have been so unhappy that I have been crying. 0  The thought of harming myself has occurred to me. 0  Edinburgh Postnatal Depression Scale Total 2     After visit meds:  Allergies as of 08/25/2021       Reactions   Augmentin [amoxicillin-pot Clavulanate] Rash   Amoxicillin Hives, Itching, Rash     Med Rec must be completed prior to using this [facility]***        Discharge home in stable condition Infant  Feeding: {Baby feeding:23562} Infant Disposition:{CHL IP OB HOME WITH HFWYOV:78588} Discharge instruction: per After Visit Summary and Postpartum booklet. Activity: Advance as tolerated. Pelvic rest for 6 weeks.  Diet: {OB FOYD:74128786} Future Appointments: Future Appointments  Date Time Provider [place]  03/19/2022  8:00 AM Paseda, Dewaine Conger, FNP [facility]   Follow up Visit:   Please schedule this patient for a In person postpartum visit in 1 week with the following provider: MD. Additional Postpartum F/U: pp tubal   High risk pregnancy complicated by: HTN Delivery mode:  Vaginal, Spontaneous  Anticipated Birth Control:  Plans Interval BTL   08/25/2021 Julianne Handler, CNM

## 2021-08-25 NOTE — Anesthesia Preprocedure Evaluation (Signed)
Anesthesia Evaluation  Patient identified by MRN, date of birth, ID band Patient awake    Reviewed: Allergy & Precautions, Patient's Chart, lab work & pertinent test results  Airway Mallampati: II       Dental no notable dental hx. (+) Teeth Intact   Pulmonary Current Smoker and Patient abstained from smoking.,    Pulmonary exam normal        Cardiovascular hypertension, Pt. on medications and Pt. on home beta blockers Normal cardiovascular exam Rhythm:Regular     Neuro/Psych  Headaches, PSYCHIATRIC DISORDERS Anxiety Depression Bipolar Disorder Hx/o Panic attacks   GI/Hepatic Neg liver ROS, GERD  Medicated,  Endo/Other  Morbid obesity  Renal/GU negative Renal ROS  negative genitourinary   Musculoskeletal negative musculoskeletal ROS (+)   Abdominal   Peds  Hematology negative hematology ROS (+)   Anesthesia Other Findings   Reproductive/Obstetrics (+) Pregnancy                             Anesthesia Physical Anesthesia Plan  ASA: 3  Anesthesia Plan: Epidural   Post-op Pain Management:    Induction:   PONV Risk Score and Plan:   Airway Management Planned: Natural Airway  Additional Equipment:   Intra-op Plan:   Post-operative Plan:   Informed Consent: I have reviewed the patients History and Physical, chart, labs and discussed the procedure including the risks, benefits and alternatives for the proposed anesthesia with the patient or authorized representative who has indicated his/her understanding and acceptance.       Plan Discussed with: Anesthesiologist  Anesthesia Plan Comments:         Anesthesia Quick Evaluation

## 2021-08-25 NOTE — Anesthesia Procedure Notes (Signed)
Epidural Patient location during procedure: OB Start time: 08/25/2021 2:57 AM End time: 08/25/2021 3:05 AM  Staffing Anesthesiologist: Mal Amabile, MD Performed: anesthesiologist   Preanesthetic Checklist Completed: patient identified, IV checked, site marked, risks and benefits discussed, surgical consent, monitors and equipment checked, pre-op evaluation and timeout performed  Epidural Patient position: sitting Prep: DuraPrep and site prepped and draped Patient monitoring: continuous pulse ox and blood pressure Approach: midline Location: L3-L4 Injection technique: LOR air  Needle:  Needle type: Tuohy  Needle gauge: 17 G Needle length: 9 cm and 9 Needle insertion depth: 8 cm Catheter type: closed end flexible Catheter size: 19 Gauge Catheter at skin depth: 14 cm Test dose: negative and Other  Assessment Events: blood not aspirated, injection not painful, no injection resistance, no paresthesia and negative IV test  Additional Notes Patient identified. Risks and benefits discussed including failed block, incomplete  Pain control, post dural puncture headache, nerve damage, paralysis, blood pressure Changes, nausea, vomiting, reactions to medications-both toxic and allergic and post Partum back pain. All questions were answered. Patient expressed understanding and wished to proceed. Sterile technique was used throughout procedure. Epidural site was Dressed with sterile barrier dressing. No paresthesias, signs of intravascular injection Or signs of intrathecal spread were encountered.  Patient was more comfortable after the epidural was dosed. Please see RN's note for documentation of vital signs and FHR which are stable. Reason for block:procedure for pain

## 2021-08-25 NOTE — Anesthesia Postprocedure Evaluation (Signed)
Anesthesia Post Note  Patient: Kylie Bailey  Procedure(s) Performed: AN AD [place]     Patient location during evaluation: Mother Baby Anesthesia Type: Epidural Level of consciousness: awake and alert Pain management: pain level controlled Vital Signs Assessment: post-procedure vital signs reviewed and stable Respiratory status: spontaneous breathing, nonlabored ventilation and respiratory function stable Cardiovascular status: stable Postop Assessment: no headache, no backache and epidural receding Anesthetic complications: no   No notable events documented.  Last Vitals:  Vitals:   08/25/21 0850 08/25/21 1000  BP: 136/64 121/65  Pulse: 72 70  Resp: 18 16  Temp: 36.8 C 37.2 C  SpO2: 98% 98%    Last Pain:  Vitals:   08/25/21 1000  TempSrc: Oral  PainSc: 0-No pain   Pain Goal:                   Javi Bollman

## 2021-08-26 ENCOUNTER — Other Ambulatory Visit (HOSPITAL_COMMUNITY): Payer: Self-pay

## 2021-08-26 LAB — CBC
HCT: 31 % — ABNORMAL LOW (ref 36.0–46.0)
Hemoglobin: 10.6 g/dL — ABNORMAL LOW (ref 12.0–15.0)
MCH: 32.1 pg (ref 26.0–34.0)
MCHC: 34.2 g/dL (ref 30.0–36.0)
MCV: 93.9 fL (ref 80.0–100.0)
Platelets: 161 10*3/uL (ref 150–400)
RBC: 3.3 MIL/uL — ABNORMAL LOW (ref 3.87–5.11)
RDW: 13.4 % (ref 11.5–15.5)
WBC: 10.8 10*3/uL — ABNORMAL HIGH (ref 4.0–10.5)
nRBC: 0 % (ref 0.0–0.2)

## 2021-08-26 MED ORDER — IBUPROFEN 600 MG PO TABS: 600.0000 mg | ORAL_TABLET | Freq: Four times a day (QID) | ORAL | 0 refills | Status: AC | PRN

## 2021-08-26 MED ORDER — LABETALOL HCL 200 MG PO TABS
200.0000 mg | ORAL_TABLET | Freq: Two times a day (BID) | ORAL | 1 refills | Status: DC
Start: 2021-08-26 — End: 2021-08-26

## 2021-08-26 MED ORDER — LABETALOL HCL 200 MG PO TABS
200.0000 mg | ORAL_TABLET | Freq: Two times a day (BID) | ORAL | 0 refills | Status: DC
  Filled 2021-08-26 – 2021-10-28 (×3): qty 60, 30d supply, fill #0

## 2021-08-26 NOTE — Clinical Social Work Maternal (Signed)
CLINICAL SOCIAL WORK MATERNAL/CHILD NOTE  Patient Details  Name: Taleeyah L Lazaro MRN: [id number] Date of Birth: 10/31/1990  Date:  08/26/2021  Clinical Social Worker Initiating Note:  Neysha Criado, LCSW Date/Time: Initiated:  08/26/21/1015     Child's Name:  A'sohn Dimauro   Biological Parents:  Mother, Father (MOB: Levenia Klinedinst 12/29/1992, FOB: Travanti Hardage 07-08-1992)   Need for Interpreter:  None   Reason for Referral:  Behavioral Health Concerns   Address:  [address] [place] [address]    Phone number:  [phone] (home)     Additional phone number:   Household Members/Support Persons (HM/SP):   Household Member/Support Person 1, Household Member/Support Person 2, Household Member/Support Person 3   HM/SP Name Relationship DOB or Age  HM/SP -1 Travanti Delahoussaye Son 06-28-2018  HM/SP -2 Leigha Shea Daughter 04-21-2013  HM/SP -3 Travanti Hnat Spouse 07-08-1992  HM/SP -4        HM/SP -5        HM/SP -6        HM/SP -7        HM/SP -8          Natural Supports (not living in the home):  Immediate Family, Extended Family   Professional Supports: None   Employment: Unemployed (FOB employed at [employer])   Type of Work:     Education:  Some College   Homebound arranged:    Financial Resources:  Medicaid   Other Resources:  WIC, Food Stamps     Cultural/Religious Considerations Which May Impact Care:    Strengths:  Ability to meet basic needs  , Home prepared for child  , Pediatrician chosen   Psychotropic Medications:         Pediatrician:    [place]  Pediatrician List:       [place]    [place]    [place] [facility]  [place]    [place]      Pediatrician Fax Number:    Risk Factors/Current Problems:      Cognitive State:  Able to Concentrate  , Insightful  , Alert  , Linear Thinking     Mood/Affect:  Bright  , Calm  ,  Comfortable  , Interested     CSW Assessment: CSW received consult for Hx anx/dep/panic attacks. CSW also noted that MOB has hx. of Bipolar. CSW met with MOB to offer support and complete assessment.    CSW met with MOB at bedside and introduced CSW role. CSW observed MOB in bed, FOB present at bedside and the infant asleep in the bassinet. MOB gave CSW permission to share information with FOB present. MOB identified her spouse, children and family as supports. CSW inquired how MOB has felt since giving birth. MOB reported that she has felt "chill and cool." MOB expressed the delivery was scary due to having blood loss. CSW validated MOB response as appropriate.   CSW inquired about MOB mental health history. MOB acknowledged that she has Bipolar, depression and anxiety diagnosed in 2019 at [facility]. MOB reported that she took medication in the past to treat symptoms but no current medications. MOB reported the last time she had concerns with her mental health was in 2019.  MOB reported, "I have been able to cope easier now that I understand my emotions and avoid situations that may trigger me." MOB reported she stays busy and tries to implement daily self-care, "even if it's just twenty minutes to   myself." MOB reported that her daughter is one year out from "ringing the bell" after responding well to treatment for cancer and expressed that she would like to eventually see a therapist to process her own emotions. CSW encouraged MOB to follow up and provided a list of mental health therapist. CSW discussed PPD. MOB was knowledgeable of PPD symptoms and receptive to the resources provided. MOB reported no history of PPD. CSW provided education regarding the baby blues period vs. perinatal mood disorders, discussed treatment and gave resources for mental health follow up if concerns arise.  CSW recommended MOB complete a self-evaluation during the postpartum time period using the New Mom Checklist from Postpartum  Progress and encouraged MOB to contact a medical professional if symptoms are noted at any time. CSW assessed MOB for safety. MOB denied thoughts of harm to self and others.   CSW provided review of Sudden Infant Death Syndrome (SIDS) precautions. MOB reported she has essential items for the infant including a bassinet where the infant will sleep. MOB has chosen [facility] for the infant's follow up care. CSW assessed MOB for additional needs. MOB reported no further need.  CSW identifies no further need for intervention and no barriers to discharge at this time.    CSW Plan/Description:  Sudden Infant Death Syndrome (SIDS) Education, Perinatal Mood and Anxiety Disorder (PMADs) Education, No Further Intervention Required/No Barriers to Discharge    Ranny Wiebelhaus A Graysyn Bache, LCSW 08/26/2021, 1:29 PM 

## 2021-08-26 NOTE — Progress Notes (Signed)
Patient blood pressure 112/70. Labetalol ordered held at the moment. Raelyn Mora, CNM notified.

## 2021-08-26 NOTE — Lactation Note (Signed)
This note was copied from a baby's chart. Lactation Consultation Note  Patient Name: Kylie Bailey [id number] Date: 08/26/2021 Reason for consult: Follow-up assessment Age:30 hours  Per mom baby is doing well. He fed frequently through the night. Mom states that she has not had any pain with the latch and that she did feed baby a little formula last night, but he has not had much.   LC spoke with the parents about warning signs, infant output, engorgement, and outpatient services.   Mom states that she would like to have [facility] send an email to outpatient LC.   Message sent for outpatient consultation.   Maternal Data    Feeding Mother's Current Feeding Choice: Breast Milk and Formula  LATCH Score                    Lactation Tools Discussed/Used    Interventions Interventions: Breast feeding basics reviewed;Education  Discharge Discharge Education: Engorgement and breast care;Warning signs for feeding baby;Outpatient recommendation;Outpatient Epic message sent  Consult Status Consult Status: Complete Date: 08/26/21 Follow-up type: Call as needed    Delene Loll 08/26/2021, 12:41 PM

## 2021-08-26 NOTE — Discharge Instructions (Signed)
Call the office ([phone]) [facility] for these signs of pre-ecl[facility]:  Severe headache that does not go away with Tylenol  Visual changes- seeing spots, double, blurred vision  Pain under your right breast or upper abdomen that does not go away with Tums or heartburn medicine  Nausea and/or vomiting  Severe swelling in your hands, feet, and face       

## 2021-08-28 ENCOUNTER — Other Ambulatory Visit: Payer: Medicaid Other

## 2021-08-28 ENCOUNTER — Encounter: Payer: Medicaid Other | Admitting: Advanced Practice Midwife

## 2021-09-01 ENCOUNTER — Other Ambulatory Visit: Payer: Medicaid Other | Admitting: Obstetrics & Gynecology

## 2021-09-03 ENCOUNTER — Encounter: Payer: Self-pay | Admitting: *Deleted

## 2021-09-03 ENCOUNTER — Ambulatory Visit (INDEPENDENT_AMBULATORY_CARE_PROVIDER_SITE_OTHER): Payer: Medicaid Other | Admitting: *Deleted

## 2021-09-03 VITALS — BP 111/69 | HR 61

## 2021-09-03 DIAGNOSIS — Z0130 Encounter for examination of blood pressure without abnormal findings: Secondary | ICD-10-CM

## 2021-09-03 NOTE — Progress Notes (Addendum)
   NURSE VISIT- BLOOD PRESSURE CHECK  SUBJECTIVE:  Kylie Bailey is a 31 y.o. [phone] female here for BP check. She is postpartum, delivery date 08/25/21     HYPERTENSION ROS:  Pregnant/postpartum:  Severe headaches that don't go away with tylenol/other medicines: No  Visual changes (seeing spots/double/blurred vision) No  Severe pain under right breast breast or in center of upper chest No  Severe nausea/vomiting No  Taking medicines as instructed yes    OBJECTIVE:  BP 140/65 (BP Location: Right Arm, Patient Position: Sitting, Cuff Size: Large)   Pulse 72   LMP 12/05/2020 (Exact Date)   Breastfeeding Yes  BP 2nd time was 111/69 pulse 61. Appearance alert, well appearing, and in no distress.  ASSESSMENT: Postpartum  blood pressure check  PLAN: Discussed with Cathie Beams, CNM   Recommendations: no changes needed. Stop BP med 3 days before postpartum visit.  Follow-up: as scheduled   Malachy Mood  09/03/2021 10:36 AM  Chart reviewed for nurse visit. Agree with plan of care.  Jacklyn Shell, CNM 09/03/2021 11:48 AM

## 2021-09-04 ENCOUNTER — Encounter: Payer: Medicaid Other | Admitting: Obstetrics & Gynecology

## 2021-09-04 ENCOUNTER — Other Ambulatory Visit: Payer: Medicaid Other

## 2021-09-11 ENCOUNTER — Inpatient Hospital Stay (HOSPITAL_COMMUNITY): Admit: 2021-09-11 | Payer: Self-pay

## 2021-09-11 ENCOUNTER — Other Ambulatory Visit: Payer: Medicaid Other

## 2021-09-20 ENCOUNTER — Other Ambulatory Visit: Payer: Self-pay

## 2021-09-20 ENCOUNTER — Emergency Department (HOSPITAL_COMMUNITY)
Admission: EM | Admit: 2021-09-20 | Discharge: 2021-09-20 | Disposition: A | Discharge status: Home / Self Care | Payer: Medicaid Other | Attending: Emergency Medicine | Admitting: Emergency Medicine

## 2021-09-20 ENCOUNTER — Encounter (HOSPITAL_COMMUNITY): Payer: Self-pay | Admitting: Emergency Medicine

## 2021-09-20 DIAGNOSIS — O165 Unspecified maternal hypertension, complicating the puerperium: Secondary | ICD-10-CM | POA: Insufficient documentation

## 2021-09-20 DIAGNOSIS — O99355 Diseases of the nervous system complicating the puerperium: Secondary | ICD-10-CM | POA: Diagnosis not present

## 2021-09-20 DIAGNOSIS — Z79899 Other long term (current) drug therapy: Secondary | ICD-10-CM | POA: Insufficient documentation

## 2021-09-20 DIAGNOSIS — G43009 Migraine without aura, not intractable, without status migrainosus: Secondary | ICD-10-CM | POA: Insufficient documentation

## 2021-09-20 LAB — CBC WITH DIFFERENTIAL/PLATELET
Abs Immature Granulocytes: 0.03 10*3/uL (ref 0.00–0.07)
Basophils Absolute: 0 10*3/uL (ref 0.0–0.1)
Basophils Relative: 1 %
Eosinophils Absolute: 0.2 10*3/uL (ref 0.0–0.5)
Eosinophils Relative: 2 %
HCT: 38.7 % (ref 36.0–46.0)
Hemoglobin: 13.1 g/dL (ref 12.0–15.0)
Immature Granulocytes: 1 %
Lymphocytes Relative: 26 %
Lymphs Abs: 1.7 10*3/uL (ref 0.7–4.0)
MCH: 31.1 pg (ref 26.0–34.0)
MCHC: 33.9 g/dL (ref 30.0–36.0)
MCV: 91.9 fL (ref 80.0–100.0)
Monocytes Absolute: 0.4 10*3/uL (ref 0.1–1.0)
Monocytes Relative: 7 %
Neutro Abs: 4 10*3/uL (ref 1.7–7.7)
Neutrophils Relative %: 63 %
Platelets: 243 10*3/uL (ref 150–400)
RBC: 4.21 MIL/uL (ref 3.87–5.11)
RDW: 12.1 % (ref 11.5–15.5)
WBC: 6.3 10*3/uL (ref 4.0–10.5)
nRBC: 0 % (ref 0.0–0.2)

## 2021-09-20 LAB — COMPREHENSIVE METABOLIC PANEL
ALT: 42 U/L (ref 0–44)
AST: 29 U/L (ref 15–41)
Albumin: 3.6 g/dL (ref 3.5–5.0)
Alkaline Phosphatase: 71 U/L (ref 38–126)
Anion gap: 7 (ref 5–15)
BUN: 10 mg/dL (ref 6–20)
CO2: 24 mmol/L (ref 22–32)
Calcium: 8.8 mg/dL — ABNORMAL LOW (ref 8.9–10.3)
Chloride: 110 mmol/L (ref 98–111)
Creatinine, Ser: 1 mg/dL (ref 0.44–1.00)
GFR, Estimated: >60 mL/min (ref >60)
Glucose, Bld: 98 mg/dL (ref 70–99)
Potassium: 4.3 mmol/L (ref 3.5–5.1)
Sodium: 141 mmol/L (ref 135–145)
Total Bilirubin: 0.5 mg/dL (ref 0.3–1.2)
Total Protein: 6.2 g/dL — ABNORMAL LOW (ref 6.5–8.1)

## 2021-09-20 MED ORDER — KETOROLAC TROMETHAMINE 30 MG/ML IJ SOLN
15.0000 mg | Freq: Once | INTRAMUSCULAR | Status: AC
  Administered 2021-09-20: 15 mg via INTRAVENOUS
  Filled 2021-09-20: qty 1

## 2021-09-20 MED ORDER — DIPHENHYDRAMINE HCL 50 MG/ML IJ SOLN
12.5000 mg | Freq: Once | INTRAMUSCULAR | Status: AC
  Administered 2021-09-20: 12.5 mg via INTRAVENOUS
  Filled 2021-09-20: qty 1

## 2021-09-20 MED ORDER — BUTALBITAL-APAP-CAFFEINE 50-325-40 MG PO TABS: 1.0000 | ORAL_TABLET | Freq: Four times a day (QID) | ORAL | 0 refills | Status: DC | PRN

## 2021-09-20 MED ORDER — SODIUM CHLORIDE 0.9 % IV BOLUS
1000.0000 mL | Freq: Once | INTRAVENOUS | Status: AC
  Administered 2021-09-20: 1000 mL via INTRAVENOUS

## 2021-09-20 MED ORDER — PROCHLORPERAZINE EDISYLATE 10 MG/2ML IJ SOLN
10.0000 mg | Freq: Once | INTRAMUSCULAR | Status: AC
  Administered 2021-09-20: 10 mg via INTRAVENOUS
  Filled 2021-09-20: qty 2

## 2021-09-20 NOTE — ED Provider Notes (Signed)
[facility] EMERGENCY DEPARTMENT Provider Note   CSN: [id number] Arrival date & time: 09/20/21  8882     History  Chief Complaint  Patient presents with   Migraine    Kylie Bailey is a 31 y.o. female with a history significant for hypertension, anxiety and panic, bipolar disorder and history of preeclampsia with her recent pregnancy, also history of migraine headaches presenting for evaluation of a migraine which has been present for the past week.  She describes a right sided frontal headache which started 7 days ago, waxing and waning, thought it was almost resolved 5 days ago but returned and has been persistent.  She also endorses nausea which is common with her migraines in association with photophobia.  She denies focal weakness, dizziness, dysarthria or scotoma.  Her migraines are also sometimes include a prodrome of left upper extremity numbness but she did not have this symptom with this episode.  She is currently 4 weeks postpartum, this was induced vaginal delivery at 37 weeks secondary to preeclampsia.  She also states she had a significant blood loss but did not require transfusions.  She has taken ibuprofen and Tylenol but has had no significant relief of her headache.  She denies fevers or chills, vomiting, abdominal pain.  She is not breast-feeding.  The history is provided by the patient.       Home Medications Prior to Admission medications   Medication Sig Start Date End Date Taking? Authorizing Provider  butalbital-acetaminophen-caffeine (FIORICET) 50-325-40 MG tablet Take 1 tablet by mouth every 6 (six) hours as needed for headache. 09/20/21 09/20/22 Yes Stephano Arrants, Almyra Free, PA-C  albuterol (VENTOLIN HFA) 108 (90 Base) MCG/ACT inhaler Inhale 1-2 puffs into the lungs every 6 (six) hours as needed for wheezing or shortness of breath. 02/07/21   Vanessa Kick, MD  ibuprofen (ADVIL) 600 MG tablet Take 1 tablet (600 mg total) by mouth every 6 (six) hours as needed for moderate  pain, mild pain or cramping. 08/26/21   Roma Schanz, CNM  labetalol (NORMODYNE) 200 MG tablet Take 1 tablet (200 mg total) by mouth 2 (two) times daily. 08/26/21   Roma Schanz, CNM  Prenatal MV & Min w/FA-DHA (PRENATAL ADULT GUMMY/DHA/FA) 0.4-25 MG CHEW Chew 1 tablet by mouth daily. 10/31/17   Lovenia Kim, MD  famotidine (PEPCID) 20 MG tablet Take 1 tablet (20 mg total) by mouth 2 (two) times daily. 09/02/18 03/08/19  Charlesetta Shanks, MD  hydrochlorothiazide (MICROZIDE) 12.5 MG capsule Take 1 capsule (12.5 mg total) by mouth daily. 08/31/18 03/08/19  Diallo, Earna Coder, MD  loratadine (CLARITIN) 10 MG tablet Take 1 tablet (10 mg total) by mouth daily. 06/21/18 03/08/19  [name], [place], DO  verapamil (CALAN-SR) 120 MG CR tablet Take 1 tablet (120 mg total) by mouth daily. 06/29/18 03/08/19  Glenice Bow, DO      Allergies    Augmentin [amoxicillin-pot clavulanate] and Amoxil [amoxicillin]    Review of Systems   Review of Systems  Constitutional:  Negative for chills and fever.  HENT:  Negative for congestion and sore throat.   Eyes: Negative.   Respiratory:  Negative for chest tightness and shortness of breath.   Cardiovascular:  Negative for chest pain.  Gastrointestinal:  Positive for nausea. Negative for abdominal pain and vomiting.  Genitourinary: Negative.   Musculoskeletal:  Negative for arthralgias, joint swelling and neck pain.  Skin: Negative.  Negative for rash and wound.  Neurological:  Positive for headaches. Negative for dizziness, facial asymmetry, speech difficulty,  weakness, light-headedness and numbness.  Psychiatric/Behavioral: Negative.    All other systems reviewed and are negative.   Physical Exam Updated Vital Signs BP 139/70 (BP Location: Right Arm)   Pulse 78   Temp 98.2 F (36.8 C) (Oral)   Resp 18   Ht 5' 9.5" (1.765 m)   Wt (!) 143.8 kg   SpO2 98%   Breastfeeding No   BMI 46.14 kg/m  Physical Exam Vitals and nursing note reviewed.   Constitutional:      Appearance: She is well-developed.     Comments: Uncomfortable appearing  HENT:     Head: Normocephalic and atraumatic.     Right Ear: Tympanic membrane normal.     Left Ear: Tympanic membrane normal.  Eyes:     Extraocular Movements: Extraocular movements intact.     Pupils: Pupils are equal, round, and reactive to light.  Cardiovascular:     Rate and Rhythm: Normal rate.     Heart sounds: Normal heart sounds.  Pulmonary:     Effort: Pulmonary effort is normal.  Abdominal:     Palpations: Abdomen is soft.     Tenderness: There is no abdominal tenderness.  Musculoskeletal:        General: Normal range of motion.     Cervical back: Normal range of motion and neck supple.  Lymphadenopathy:     Cervical: No cervical adenopathy.  Skin:    General: Skin is warm and dry.     Findings: No rash.  Neurological:     General: No focal deficit present.     Mental Status: She is alert and oriented to person, place, and time.     GCS: GCS eye subscore is 4. GCS verbal subscore is 5. GCS motor subscore is 6.     Cranial Nerves: No cranial nerve deficit.     Sensory: No sensory deficit.     Coordination: Coordination normal.     Gait: Gait normal.     Deep Tendon Reflexes: Reflexes normal.     Comments: Normal heel-shin, normal rapid alternating movements. Cranial nerves III-XII intact.  No pronator drift.  Psychiatric:        Speech: Speech normal.        Behavior: Behavior normal.        Thought Content: Thought content normal.     ED Results / Procedures / Treatments   Labs (all labs ordered are listed, but only abnormal results are displayed) Labs Reviewed  COMPREHENSIVE METABOLIC PANEL - Abnormal; Notable for the following components:      Result Value   Calcium 8.8 (*)    Total Protein 6.2 (*)    All other components within normal limits  CBC WITH DIFFERENTIAL/PLATELET    EKG None  Radiology No results found.  Procedures Procedures     Medications Ordered in ED Medications  sodium chloride 0.9 % bolus 1,000 mL (1,000 mLs Intravenous New Bag/Given 09/20/21 1128)  prochlorperazine (COMPAZINE) injection 10 mg (10 mg Intravenous Given 09/20/21 1130)  diphenhydrAMINE (BENADRYL) injection 12.5 mg (12.5 mg Intravenous Given 09/20/21 1128)  ketorolac (TORADOL) 30 MG/ML injection 15 mg (15 mg Intravenous Given 09/20/21 1129)    ED Course/ Medical Decision Making/ A&P                           Medical Decision Making Patient with a typical migraine headache with a normal neurologic exam.  She has received the migraine cocktail along with  a liter of normal saline and her headache has completely resolved.  She is 1 month postpartum, had preeclampsia with this pregnancy, labs obtained are reassuring she has a normal c-Met.  Her blood pressure is reasonable at 139/70.  She was given a short prescription of Fioricet for as needed use, planned PCP follow-up as needed for persistent symptoms.  Patient is not breast-feeding.  She does have help at home with her newborn infant.  Amount and/or Complexity of Data Reviewed Labs: ordered.    Details: Stable  Risk Prescription drug management.           Final Clinical Impression(s) / ED Diagnoses Final diagnoses:  Migraine without aura and without status migrainosus, not intractable    Rx / DC Orders ED Discharge Orders          Ordered    butalbital-acetaminophen-caffeine (FIORICET) 50-325-40 MG tablet  Every 6 hours PRN        09/20/21 1319              Evalee Jefferson, PA-C 09/20/21 1321    Isla Pence, MD 09/20/21 1501

## 2021-09-20 NOTE — ED Triage Notes (Signed)
Pt to the ED with a migraine for the past week.

## 2021-09-20 NOTE — Discharge Instructions (Signed)
Rest, make sure you are drinking plenty of fluids.  Get rechecked for any return or worsening symptoms.  I have prescribed you a small quantity of the Fioricet to help you if your headache returns, plan to see your primary doctor for further evaluation as needed and discussed.

## 2021-10-07 ENCOUNTER — Other Ambulatory Visit (HOSPITAL_COMMUNITY)
Admission: RE | Admit: 2021-10-07 | Discharge: 2021-10-07 | Disposition: A | Discharge status: Home / Self Care | Payer: Medicaid Other | Source: Ambulatory Visit | Attending: Advanced Practice Midwife | Admitting: Advanced Practice Midwife

## 2021-10-07 ENCOUNTER — Ambulatory Visit (INDEPENDENT_AMBULATORY_CARE_PROVIDER_SITE_OTHER): Payer: Medicaid Other | Admitting: Obstetrics & Gynecology

## 2021-10-07 ENCOUNTER — Encounter: Payer: Self-pay | Admitting: Obstetrics & Gynecology

## 2021-10-07 ENCOUNTER — Ambulatory Visit: Payer: Medicaid Other | Admitting: Advanced Practice Midwife

## 2021-10-07 DIAGNOSIS — Z3202 Encounter for pregnancy test, result negative: Secondary | ICD-10-CM

## 2021-10-07 DIAGNOSIS — Z8742 Personal history of other diseases of the female genital tract: Secondary | ICD-10-CM | POA: Diagnosis not present

## 2021-10-07 DIAGNOSIS — F320 Major depressive disorder, single episode, mild: Secondary | ICD-10-CM | POA: Diagnosis not present

## 2021-10-07 DIAGNOSIS — Z124 Encounter for screening for malignant neoplasm of cervix: Secondary | ICD-10-CM

## 2021-10-07 DIAGNOSIS — Z30430 Encounter for insertion of intrauterine contraceptive device: Secondary | ICD-10-CM | POA: Diagnosis not present

## 2021-10-07 LAB — POCT URINE PREGNANCY: Preg Test, Ur: NEGATIVE

## 2021-10-07 MED ORDER — LEVONORGESTREL 20.1 MCG/DAY IU IUD
1.0000 | INTRAUTERINE_SYSTEM | Freq: Once | INTRAUTERINE | Status: AC
  Administered 2021-10-07: 1 via INTRAUTERINE

## 2021-10-07 MED ORDER — CITALOPRAM HYDROBROMIDE 10 MG PO TABS: 20.0000 mg | ORAL_TABLET | Freq: Every day | ORAL | 6 refills | Status: DC

## 2021-10-07 NOTE — Progress Notes (Signed)
POSTPARTUM VISIT Patient name: Kylie Bailey MRN [id number]  Date of birth: Sep 08, 1990 Chief Complaint:   Postpartum Care (Wants interval BTL)  History of Present Illness:   Kylie Bailey is a 31 y.o. [phone] female being seen today for a postpartum visit. She is 6 weeks postpartum following a spontaneous vaginal delivery at 37 gestational weeks. IOL: Yes, for  chronic HTN- on Labetalol 200mg  bid .    Pregnancy complicated by cHTN .  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 Bleeding no bleeding. Bowel function is normal. Bladder function is normal. Urinary incontinence? Yes, fecal incontinence? No Patient is sexually active. Last sexual activity: recently.   Desired contraception:  options reviewed consider BTL, now desires IUD . Patient does not want a pregnancy in the future.   Desired family size is 3 children.   Upstream - 10/07/21 1341       Pregnancy Intention Screening   Does the patient want to become pregnant in the next year? No    Does the patient's partner want to become pregnant in the next year? No    Would the patient like to discuss contraceptive options today? Yes      Contraception Wrap Up   Current Method No Contraceptive Precautions    Contraception Counseling Provided Yes            The pregnancy intention screening data noted above was reviewed. Potential methods of contraception were discussed. The patient elected to proceed with No data recorded.  Edinburgh Postpartum Depression Screening: Negative; however pt notes difficulty with depression.  She is interested in therapy and medication.  Previously on Celexa and wishes to restart.   Edinburgh Postnatal Depression Scale - 10/07/21 1342       Edinburgh Postnatal Depression Scale:  In the Past 7 Days   I have been able to laugh and see the funny side of things. 1    I have looked forward with  enjoyment to things. 0    I have blamed myself unnecessarily when things went wrong. 1    I have been anxious or worried for no good reason. 0    I have felt scared or panicky for no good reason. 0    Things have been getting on top of me. 1    I have been so unhappy that I have had difficulty sleeping. 0    I have felt sad or miserable. 0    I have been so unhappy that I have been crying. 0    The thought of harming myself has occurred to me. 0    Edinburgh Postnatal Depression Scale Total 3             Baby's course has been uncomplicated. Baby is feeding by bottle. Infant has a pediatrician/family doctor? Yes.    Review of Systems:   Pertinent items are noted in HPI Denies Abnormal vaginal discharge w/ itching/odor/irritation, headaches, visual changes, shortness of breath, chest pain, abdominal pain, severe nausea/vomiting, or problems with urination or bowel movements. Pertinent History Reviewed:  Reviewed past medical,surgical, obstetrical and family history.  Reviewed problem list, medications and allergies. OB History  Gravida Para Term Preterm AB Living  4 3 3   1 3   SAB IAB Ectopic Multiple Live Births        0 3    # Outcome Date GA Lbr Len/2nd Weight Sex Delivery Anes  PTL Lv  4 Term 08/25/21 [redacted]w[redacted]d  6 lb 13 oz (3.09 kg) M Vag-Spont   LIV  3 AB 07/20/20 [redacted]w[redacted]d         2 Term 06/28/18 [redacted]w[redacted]d 01:18 / 00:06 8 lb 13.8 oz (4.02 kg) M Vag-Spont EPI  LIV     Birth Comments: facial brusing   1 Term 04/21/13 [redacted]w[redacted]d  8 lb 5 oz (3.771 kg) F Vag-Spont EPI N LIV   Physical Assessment:   Vitals:   10/07/21 1341  BP: 138/90  Pulse: 73  Weight: (!) 301 lb (136.5 kg)  Body mass index is 43.81 kg/m.       Physical Examination:   General appearance: alert, well appearing, and in no distress  Mental status: depressed mood  Skin: warm & dry   Cardiovascular: normal heart rate noted   Respiratory: normal respiratory effort, no distress   Breasts: no masses or abnormalities  noted  Abdomen: obese, soft, non-tender   Pelvic: normal external genitalia, vulva, vagina, cervix, uterus and adnexa  Extremities: no edema  IUD INSERTION The risks and benefits of the method and placement have been thouroughly reviewed with the patient and all questions were answered.  Specifically the patient is aware of failure rate of 04/998, expulsion of the IUD and of possible perforation.  The patient is aware of irregular bleeding due to the method and understands the incidence of irregular bleeding diminishes with time.  Signed copy of informed consent in chart.   Time out was performed.  A sterile speculum was placed in the vagina.  The cervix was visualized, prepped using Betadine, and grasped with a single tooth tenaculum. The uterus was found to be neutral and it sounded to 7 cm.  Liletta  IUD placed per manufacturer's recommendations. The strings were trimmed to approximately 3 cm. The patient tolerated the procedure well.   Chaperone: Alice Rieger         Results for orders placed or performed in visit on 10/07/21 (from the past 24 hour(s))  POCT urine pregnancy   Collection Time: 10/07/21  2:21 PM  Result Value Ref Range   Preg Test, Ur Negative Negative    Assessment & Plan:  1) Postpartum exam 2) 6 wks s/p spontaneous vaginal delivery 3) bottle feeding 4) Depression screening- referral to integrative health and started on Celexa 20mg  daily 5) Contraception management: Liletta placed today, f/u in 36mos  Essential components of care per ACOG recommendations:  3. Sexuality, contraception and birth spacing Provided guidance regarding sexuality, management of dyspareunia, and resumption of intercourse Discussed avoiding interpregnancy interval <24mths and recommended birth spacing of 18 months  4. Sleep and fatigue Discussed coping options for fatigue and sleep disruption Encouraged family/partner/community support of 4 hrs of uninterrupted sleep to help with mood  and fatigue  5. Physical recovery  If pt had a C/S, assessed incisional pain and providing guidance on normal vs prolonged recovery If pt had a laceration, perineal healing and pain reviewed.  If urinary or fecal incontinence, discussed management and referred to PT or uro/gyn if indicated  Patient is safe to resume physical activity. Discussed attainment of healthy weight.  6.  Chronic disease management Discussed pregnancy complications if any, and their implications for future childbearing and long-term maternal health. Pt had GDM: No. If yes, 2hr GTT scheduled: not applicable. Follow up with PCP regarding BP management  7. 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    Dispense:  60 tablet    Refill:  6   levonorgestrel (LILETTA) 20.1 MCG/DAY IUD 1 each    Follow-up: Return in 2 months (on 12/08/2021) for medication/IUD follow up.   Orders Placed This Encounter  Procedures   Ambulatory referral to Integrated Behavioral Health   POCT urine pregnancy   Myna Hidalgo, DO Attending Obstetrician & Gynecologist, [facility] for [facility], [facility]

## 2021-10-13 LAB — CYTOLOGY - PAP
Comment: NEGATIVE
Diagnosis: NEGATIVE
High risk HPV: NEGATIVE

## 2021-10-22 ENCOUNTER — Telehealth: Payer: Self-pay | Admitting: Clinical

## 2021-10-22 NOTE — Telephone Encounter (Signed)
Attempt call regarding referral; Left HIPPA-compliant message to call back Reginna Sermeno from [facility] at  [phone] (Zoila Ditullio's office).    

## 2021-10-28 ENCOUNTER — Other Ambulatory Visit: Payer: Self-pay

## 2021-10-28 NOTE — BH Specialist Note (Signed)
Pt did not arrive to video visit and did not answer the phone; Left HIPPA-compliant message to call back Javaeh Muscatello from [facility] at  [phone] (Virgie Kunda's office).  ?; left MyChart message for patient.  ? ?

## 2021-11-11 ENCOUNTER — Ambulatory Visit: Payer: Medicaid Other | Admitting: Clinical

## 2021-11-11 DIAGNOSIS — Z91199 Patient's noncompliance with other medical treatment and regimen due to unspecified reason: Secondary | ICD-10-CM

## 2021-12-09 ENCOUNTER — Ambulatory Visit: Payer: Medicaid Other | Admitting: Obstetrics & Gynecology

## 2022-01-01 ENCOUNTER — Other Ambulatory Visit: Payer: Self-pay | Admitting: Obstetrics & Gynecology

## 2022-01-01 ENCOUNTER — Encounter: Payer: Self-pay | Admitting: Obstetrics & Gynecology

## 2022-01-01 DIAGNOSIS — F419 Anxiety disorder, unspecified: Secondary | ICD-10-CM

## 2022-01-01 MED ORDER — ESCITALOPRAM OXALATE 10 MG PO TABS: 10.0000 mg | ORAL_TABLET | Freq: Every day | ORAL | 11 refills | Status: DC

## 2022-01-01 NOTE — Progress Notes (Signed)
Celexa not covered, will order Lexapro instead, pt notified and to call with any concerns  Kylie Pupa, DO Attending [place], [place] for [employer], [place]

## 2022-01-31 IMAGING — CT CT HEAD CODE STROKE
3 series · 15 of 47 positions shown, 18 images · non-contrast
Comparison: MR head without and with contrast 09/02/2015

CLINICAL DATA: Code stroke. Acute onset of left upper extremity and
facial numbness. Left visual field blurry.

EXAM:
CT HEAD WITHOUT CONTRAST
TECHNIQUE: Contiguous axial images were obtained from the base of the skull
through the vertex without intravenous contrast.

[Series 3: head w o · axial · 0.47mm/px · z∈[+69,+214]mm · 9 of 35 slices shown, 12 images]
[im 3/35  brain]
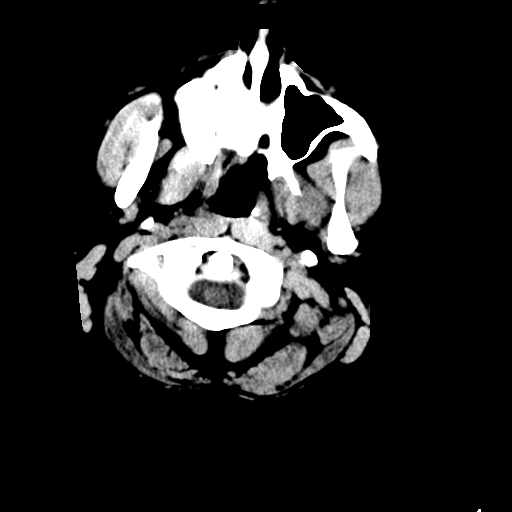
[im 3/35  bone]
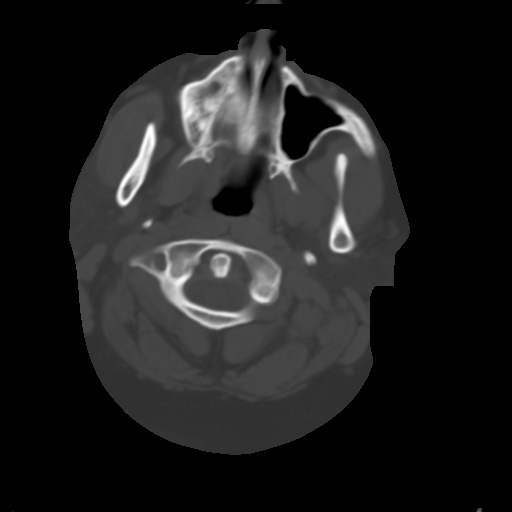
[im 6/35  brain]
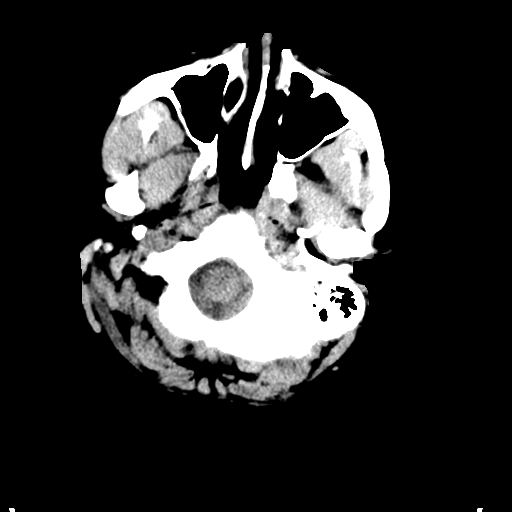
[im 10/35  brain]
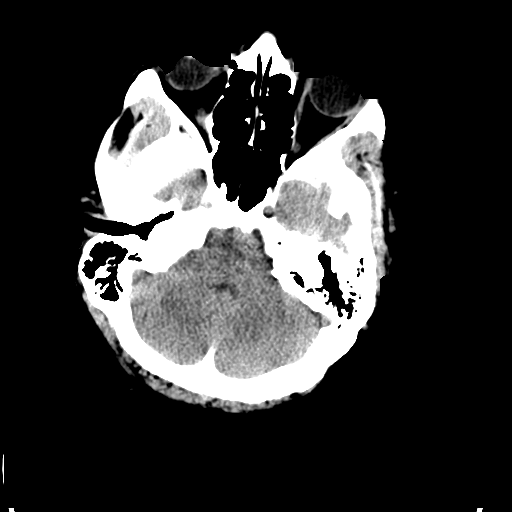
[im 13/35  brain]
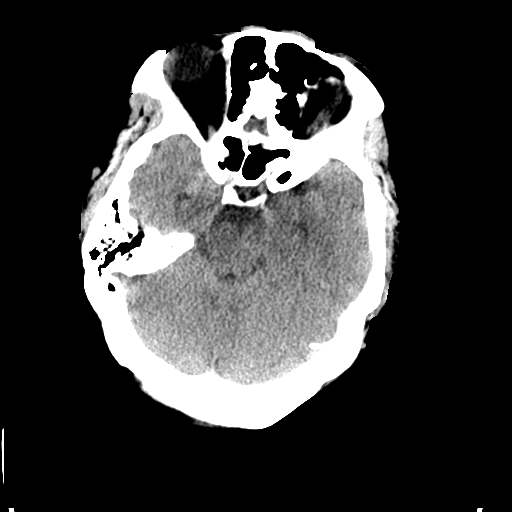
[im 18/35  brain]
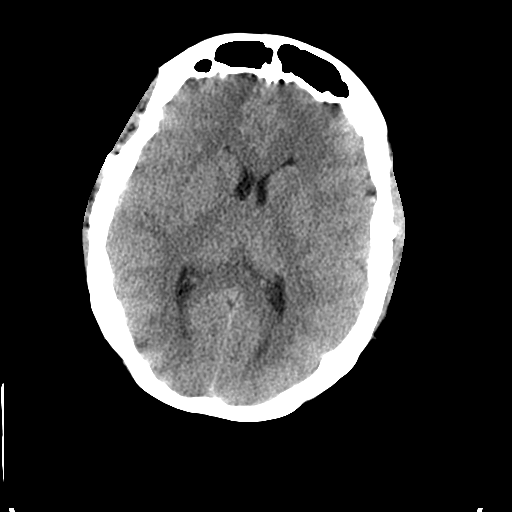
[im 18/35  bone]
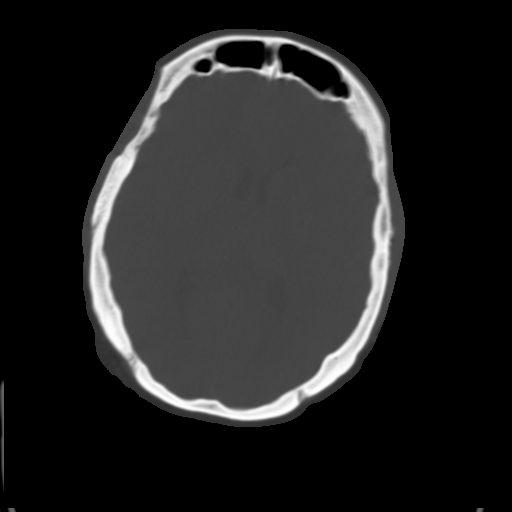
[im 22/35  brain]
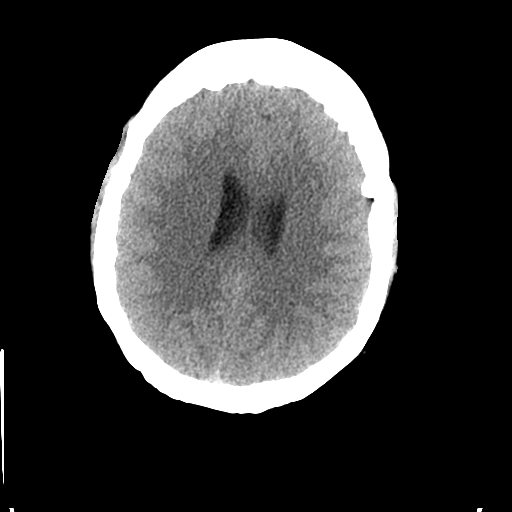
[im 25/35  brain]
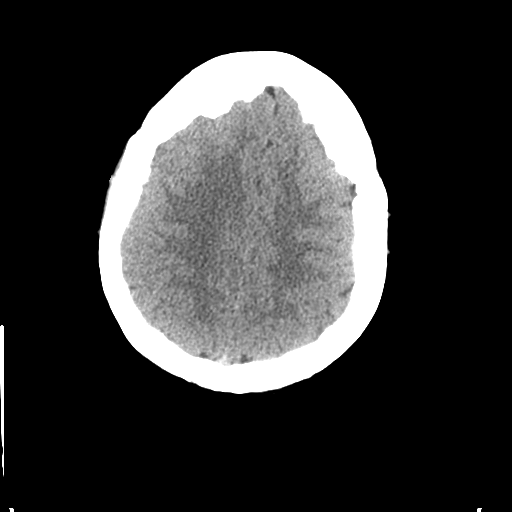
[im 29/35  brain]
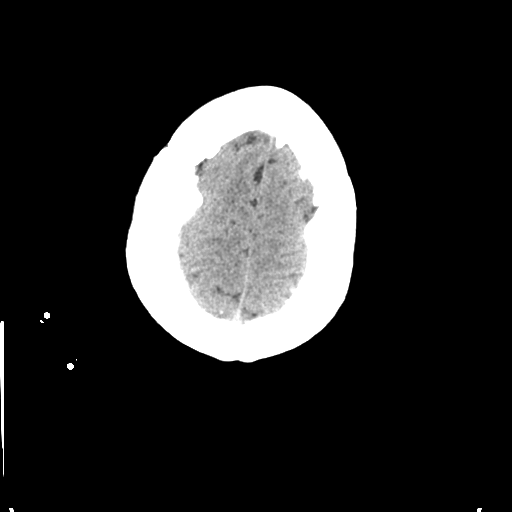
[im 32/35  brain]
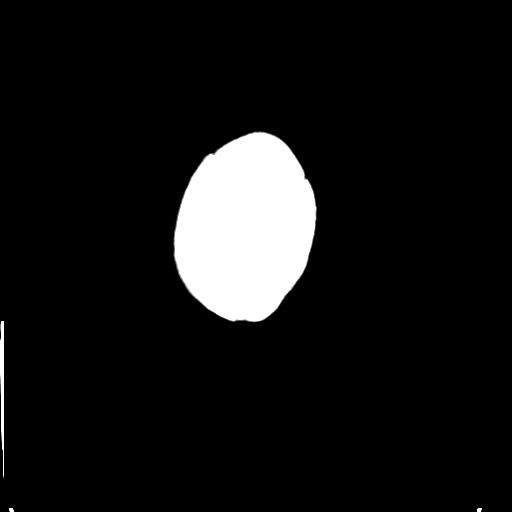
[im 32/35  bone]
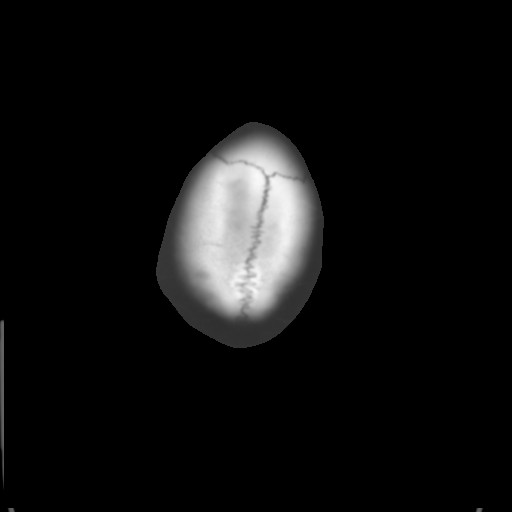

[Series 4: coronal soft · coronal · 0.34mm/px · 3 of 72 slices shown]
[im 24/72  brain]
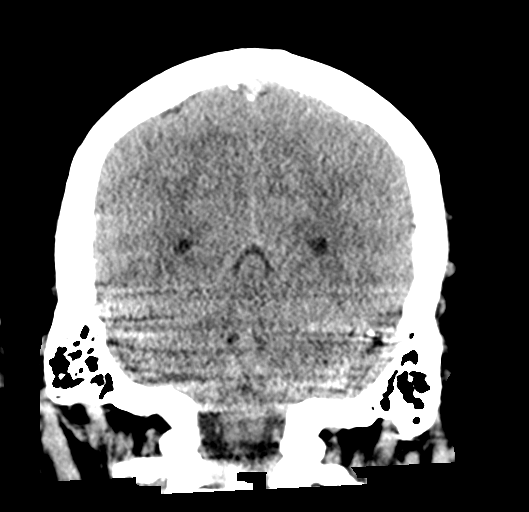
[im 32/72  brain]
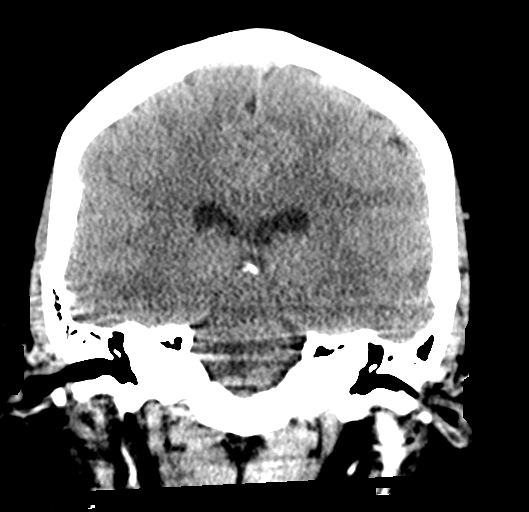
[im 40/72  brain]
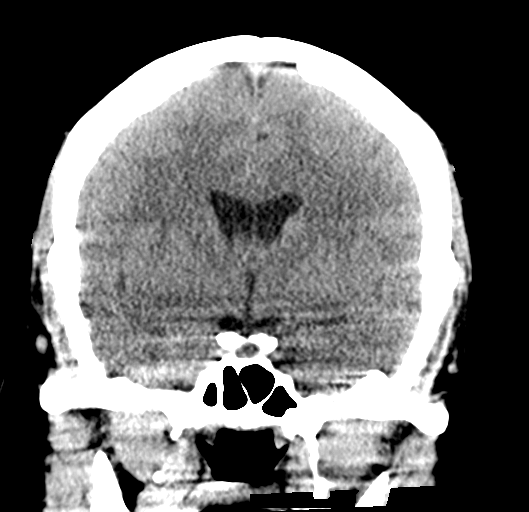

[Series 5: sagittal soft · sagittal · 0.36mm/px · 3 of 61 slices shown]
[im 21/61  brain]
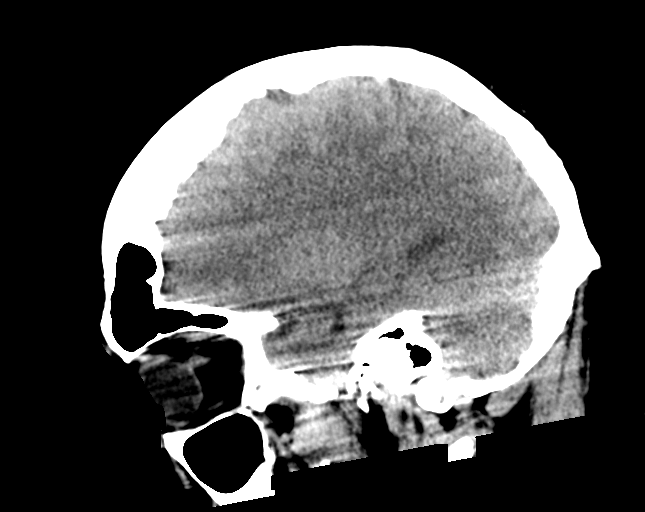
[im 31/61  brain]
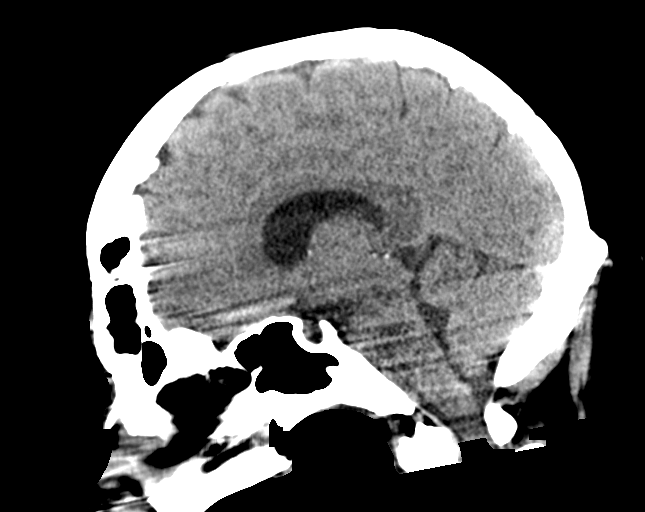
[im 41/61  brain]
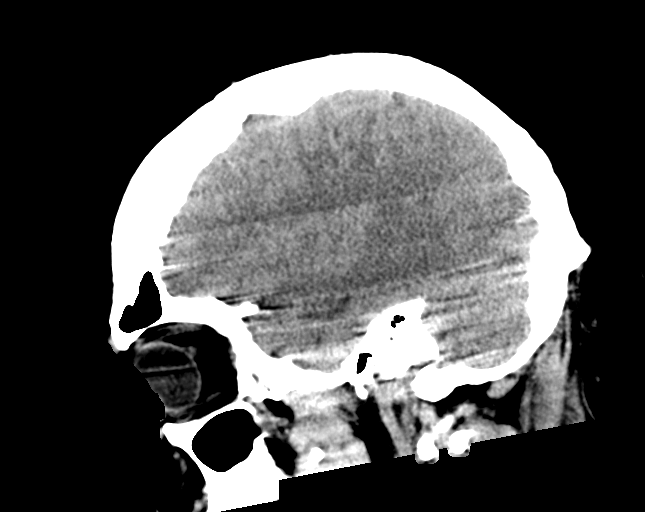

[15 of 47 positions shown; findings below may reference images not displayed]

FINDINGS: Brain: No acute infarct, hemorrhage, or mass lesion is present.
Basal ganglia are intact. Insular ribbon is normal. No acute or
focal cortical abnormalities are present. The ventricles are of
normal size. No significant extraaxial fluid collection is present.
The brainstem and cerebellum are within normal limits.

Vascular: No hyperdense vessel or unexpected calcification.

Skull: Mild diffuse calvarial thickening is present. No focal lytic
or blastic lesion is present. No fracture is present. No significant
extracranial soft tissue lesion is present.

Sinuses/Orbits: The paranasal sinuses and mastoid air cells are
clear. The globes and orbits are within normal limits.

ASPECTS (Alberta Stroke Program Early CT Score)

- Ganglionic level infarction (caudate, lentiform nuclei, internal
capsule, insula, M1-M3 cortex): [DATE]

- Supraganglionic infarction (M4-M6 cortex): [DATE]

Total score (0-10 with 10 being normal): [DATE]
IMPRESSION: 1. Negative CT of the head.
2. ASPECTS is [DATE].

These results were called by telephone at the time of interpretation
on 05/10/2020 at [DATE] to provider Anum Herrarte, PA, who verbally
acknowledged these results.

## 2022-01-31 IMAGING — CT CT ANGIO NECK
3 of 7 series · 10 of 36 positions shown · IV contrast (Omnipaque or Isovue)
Comparison: CT code stroke from the same day.

CLINICAL DATA: Focal neuro deficit. Left arm numbness and
left-sided facial numbness.

EXAM:
CT ANGIOGRAPHY HEAD AND NECK
TECHNIQUE: Multidetector CT imaging of the head and neck was performed using
the standard protocol during bolus administration of intravenous
contrast. Multiplanar CT image reconstructions and MIPs were
obtained to evaluate the vascular anatomy. Carotid stenosis
measurements (when applicable) are obtained utilizing NASCET
criteria, using the distal internal carotid diameter as the
denominator.
CONTRAST:  75mL OMNIPAQUE IOHEXOL 350 MG/ML SOLN

[Series 4: cta head & neck · axial · 0.50mm/px · z∈[-28,+94]mm · 2 of 184 slices shown]
[im 62/184  soft-tissue]
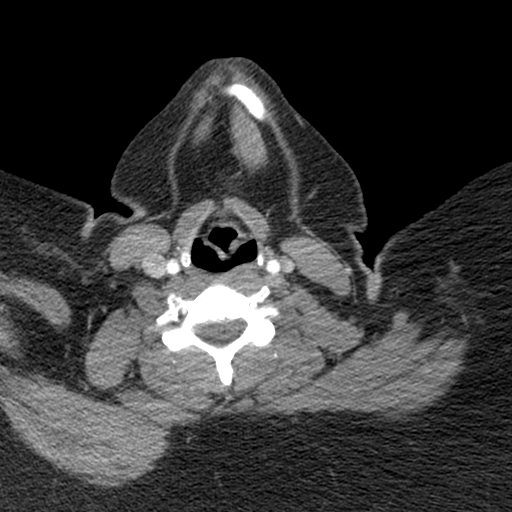
[im 123/184  soft-tissue]
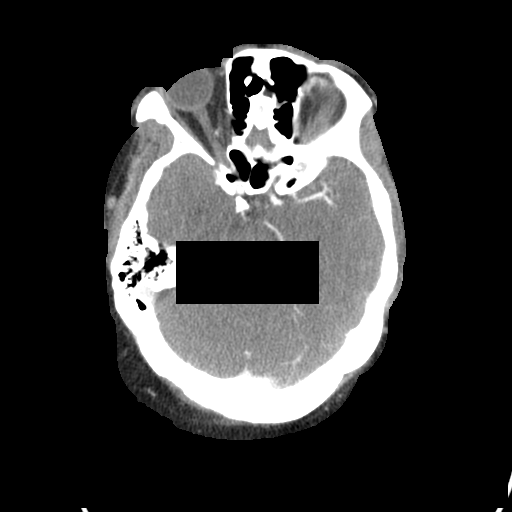

[Series 6: ax thins · axial · 0.39mm/px · z∈[-98,+161]mm · 6 of 366 slices shown]
[im 53/366  soft-tissue]
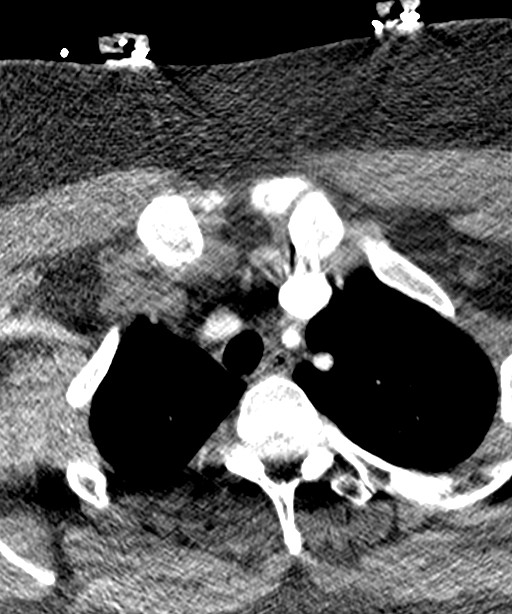
[im 105/366  bone]
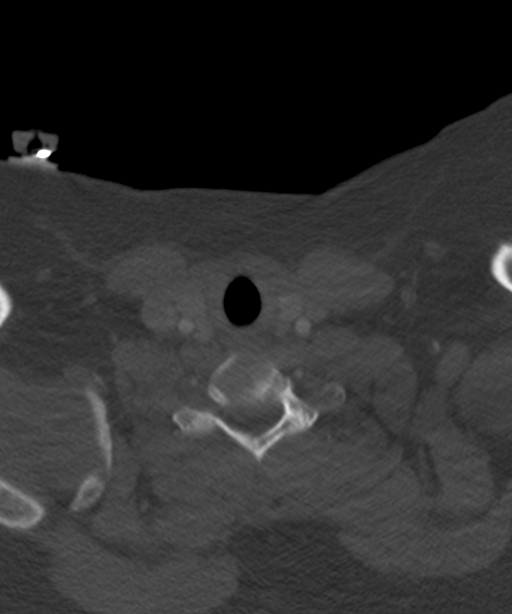
[im 157/366  soft-tissue]
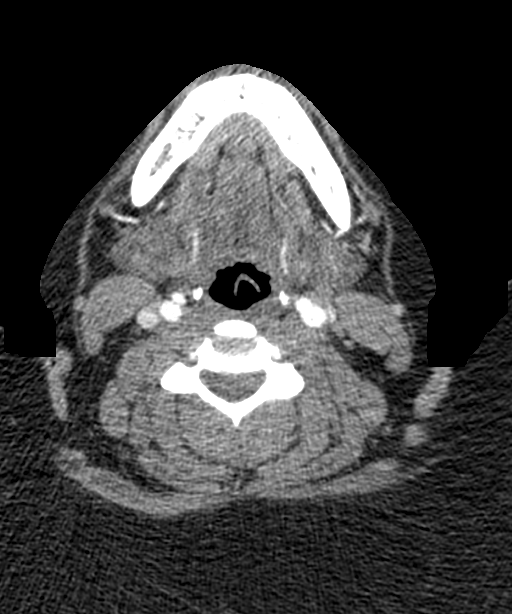
[im 209/366  bone]
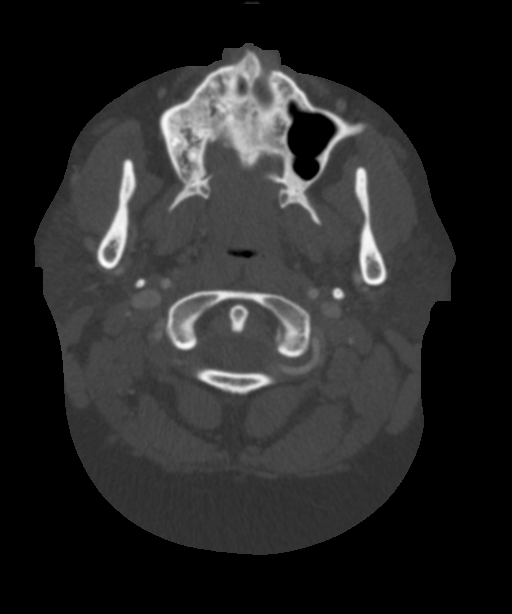
[im 261/366  soft-tissue]
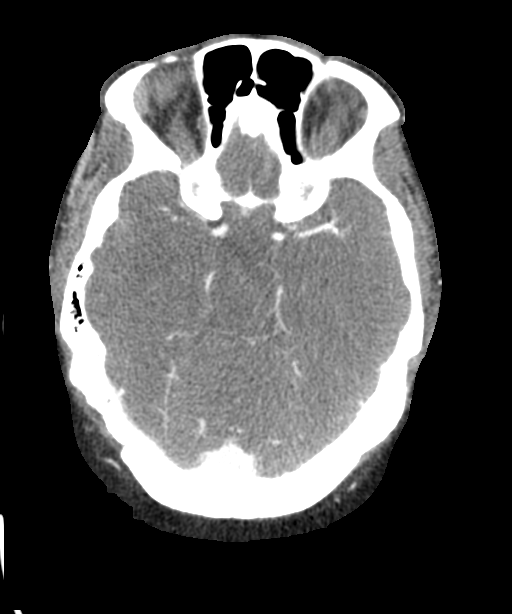
[im 313/366  bone]
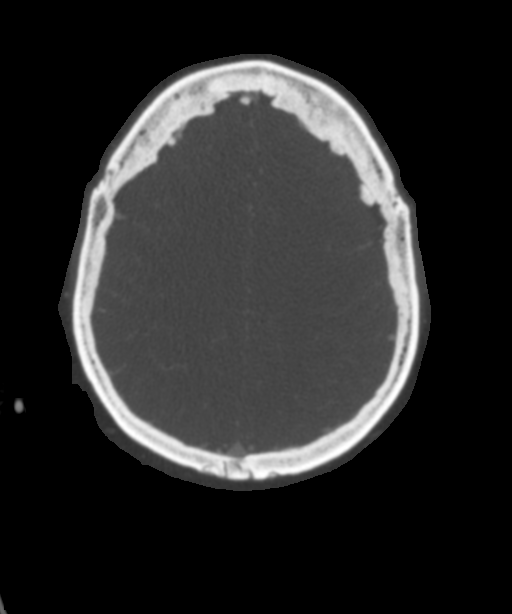

[Series 8: sag thin · sagittal · 0.48mm/px · 2 of 188 slices shown]
[im 33/188  soft-tissue]
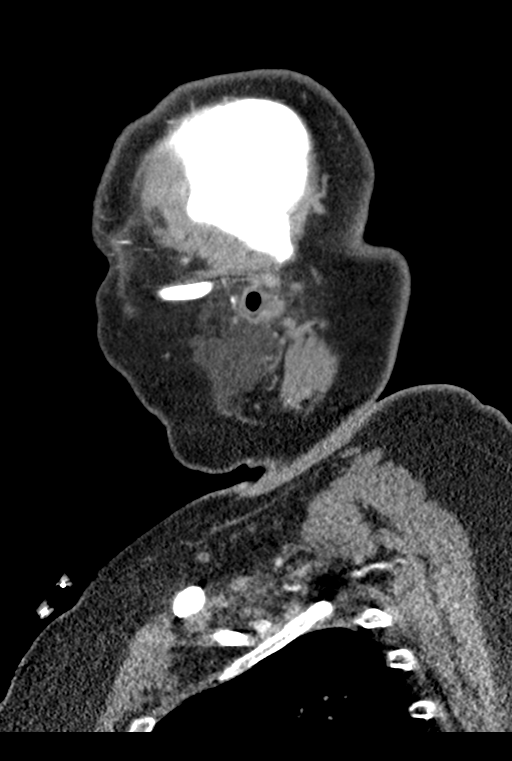
[im 156/188  soft-tissue]
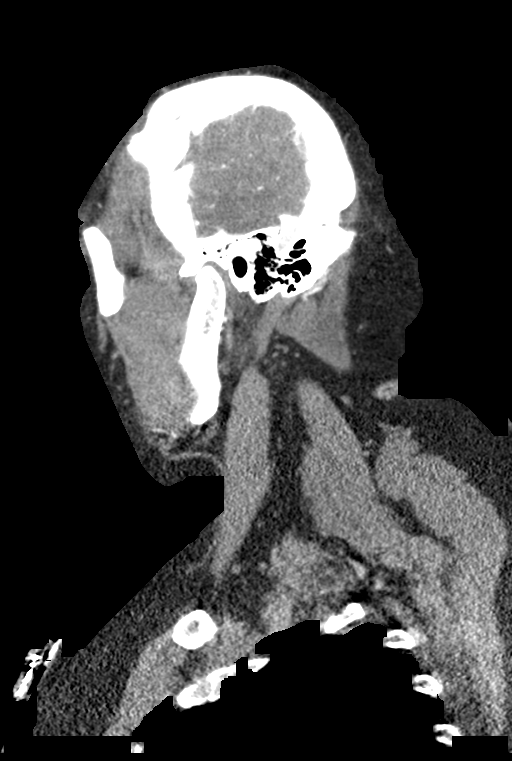

[10 of 36 positions shown; findings below may reference images not displayed]

FINDINGS: CTA NECK FINDINGS

Aortic arch: Great vessel origins are patent.

Right carotid system: No evidence of dissection, stenosis (50% or
greater) or occlusion.

Left carotid system: No evidence of dissection, stenosis (50% or
greater) or occlusion.

Vertebral arteries: Left dominant. No evidence of dissection,
stenosis (50% or greater) or occlusion.

Skeleton: No acute findings.

Other neck: No mass or suspicious adenopathy.

Upper chest: Imaged lung apices are clear.

Review of the MIP images confirms the above findings

CTA HEAD FINDINGS

Evaluation is limited by venous contamination.

Anterior circulation: No large vessel occlusion or proximal
hemodynamically significant stenosis, aneurysm, or vascular
malformation. Evaluation of the more distal vasculature is limited
by venous contamination.

Posterior circulation: No large vessel occlusion or proximal
hemodynamically significant stenosis, aneurysm, or vascular
malformation. Diminutive vertebrobasilar system with bilateral fetal
type PCAs.

Venous sinuses: As permitted by contrast timing, patent.

Review of the MIP images confirms the above findings
IMPRESSION: 1. No evidence of emergent large vessel occlusion or hemodynamically
significant proximal stenosis in the head or neck.
2. Diminutive vertebrobasilar system with bilateral fetal type PCAs,
anatomic variant

## 2022-03-01 ENCOUNTER — Other Ambulatory Visit: Payer: Self-pay

## 2022-03-01 NOTE — Telephone Encounter (Signed)
Patient called and states that she needs a refill on her Labetalol, she is on her last dose.

## 2022-03-02 NOTE — Telephone Encounter (Signed)
Notified patient refill request was denied because she needs to get this from her PCP. Patient verbalized understanding.

## 2022-03-04 ENCOUNTER — Ambulatory Visit: Payer: Medicaid Other | Admitting: Family Medicine

## 2022-03-04 ENCOUNTER — Other Ambulatory Visit: Payer: Self-pay | Admitting: Family Medicine

## 2022-03-04 ENCOUNTER — Encounter: Payer: Self-pay | Admitting: Family Medicine

## 2022-03-04 VITALS — BP 132/84 | HR 77 | Ht 69.0 in | Wt 308.0 lb

## 2022-03-04 DIAGNOSIS — J302 Other seasonal allergic rhinitis: Secondary | ICD-10-CM

## 2022-03-04 DIAGNOSIS — F419 Anxiety disorder, unspecified: Secondary | ICD-10-CM | POA: Diagnosis not present

## 2022-03-04 DIAGNOSIS — I10 Essential (primary) hypertension: Secondary | ICD-10-CM

## 2022-03-04 DIAGNOSIS — R7301 Impaired fasting glucose: Secondary | ICD-10-CM | POA: Diagnosis not present

## 2022-03-04 DIAGNOSIS — F32A Depression, unspecified: Secondary | ICD-10-CM

## 2022-03-04 DIAGNOSIS — E038 Other specified hypothyroidism: Secondary | ICD-10-CM

## 2022-03-04 DIAGNOSIS — Z114 Encounter for screening for human immunodeficiency virus [HIV]: Secondary | ICD-10-CM | POA: Diagnosis not present

## 2022-03-04 DIAGNOSIS — E7849 Other hyperlipidemia: Secondary | ICD-10-CM | POA: Diagnosis not present

## 2022-03-04 DIAGNOSIS — Z1159 Encounter for screening for other viral diseases: Secondary | ICD-10-CM | POA: Diagnosis not present

## 2022-03-04 DIAGNOSIS — E559 Vitamin D deficiency, unspecified: Secondary | ICD-10-CM | POA: Diagnosis not present

## 2022-03-04 MED ORDER — ALBUTEROL SULFATE HFA 108 (90 BASE) MCG/ACT IN AERS: 1.0000 | INHALATION_SPRAY | Freq: Four times a day (QID) | RESPIRATORY_TRACT | 1 refills | Status: DC | PRN

## 2022-03-04 MED ORDER — ESCITALOPRAM OXALATE 20 MG PO TABS: 20.0000 mg | ORAL_TABLET | Freq: Every day | ORAL | 1 refills | Status: DC

## 2022-03-04 MED ORDER — LABETALOL HCL 200 MG PO TABS: 200.0000 mg | ORAL_TABLET | Freq: Two times a day (BID) | ORAL | 1 refills | Status: DC

## 2022-03-04 NOTE — Progress Notes (Signed)
Established Patient Office Visit  Subjective:  Patient ID: Kylie Bailey, female    DOB: 1991/03/13  Age: 31 y.o. MRN: [id number]  CC:  Chief Complaint  Patient presents with   Follow-up    Pt would like to continue care, states bp has been spiking up during the day.   Depression    Pt would like to discuss depression medication.    HPI Kylie Bailey is a 31 y.o. female with past medical history of migraines, essential hypertension, and panic attacks presents for f/u of  chronic medical conditions.  Hypertension: She takes labetalol 200 mg daily and reports running out of her medication 2 days ago.  Her blood pressure was managed by her gynecologist, who referred her to establish care with her PCP.  She reports compliance with the treatment regimen noting, to be under a great deal of stress.  She has 3 children: a girl who is disabled, a 82-year-old boy, and a 21-monthold.  She reports being married but notes that things are not working out.  She has little to no support system here in [place] reporting that it is just her and her husband.  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    Past Medical History:  Diagnosis Date   Anxiety    Bipolar disorder (HCC)    Bulimia    Depression    GERD (gastroesophageal reflux disease)    Hypertension    Migraines    Panic attacks    Pregnancy induced hypertension    Seasonal allergies     Past Surgical History:  Procedure Laterality Date   INDUCED ABORTION  07/20/2020   TONSILLECTOMY     TYMPANOSTOMY TUBE PLACEMENT Bilateral     Family History  Problem Relation Age of Onset   Diabetes Paternal Grandmother    High blood pressure Paternal Grandmother    Heart disease Paternal Grandmother    Cancer Paternal Grandmother    Drug abuse Father    Hypertension Mother     Heart attack Mother    High Cholesterol Brother    Hypertension Brother    Cancer Daughter        bone cancer   Heart disease Maternal Uncle    Colon cancer Neg Hx    Breast cancer Neg Hx    Lung cancer Neg Hx     Social History   Socioeconomic History   Marital status: Married    Spouse name: Travanti   Number of children: 2   Years of education: High School   Highest education level: 12th grade  Occupational History   Occupation: Stay Home Mom  Tobacco Use   Smoking status: Every Day    Packs/day: 0.50    Years: 5.00    Total pack years: 2.50    Types: Cigarettes   Smokeless tobacco: Never   Tobacco comments:    3 cigarettes daily   Vaping Use   Vaping Use: Never used  Substance and Sexual Activity   Alcohol use: Not Currently    Comment: 1 glass of wine 2 nights per week   Drug use: No   Sexual activity: Yes    Birth control/protection: None  Other Topics Concern   Not on file  Social History Narrative   Lives at home with her husband and two children.    Social Determinants of Health  Financial Resource Strain: Low Risk  (10/07/2021)   Overall Financial Resource Strain (CARDIA)    Difficulty of Paying Living Expenses: Not very hard  Food Insecurity: No Food Insecurity (10/07/2021)   Hunger Vital Sign    Worried About Running Out of Food in the Last Year: Never true    Ran Out of Food in the Last Year: Never true  Transportation Needs: No Transportation Needs (10/07/2021)   PRAPARE - Hydrologist (Medical): No    Lack of Transportation (Non-Medical): No  Physical Activity: Inactive (10/07/2021)   Exercise Vital Sign    Days of Exercise per Week: 0 days    Minutes of Exercise per Session: 0 min  Stress: Stress Concern Present (10/07/2021)   [place]    Feeling of Stress : To some extent  Social Connections: Socially Isolated (10/07/2021)   Social Connection and  Isolation Panel [NHANES]    Frequency of Communication with Friends and Family: Once a week    Frequency of Social Gatherings with Friends and Family: Never    Attends Religious Services: Never    Marine scientist or Organizations: No    Attends Archivist Meetings: Never    Marital Status: Married  Human resources officer Violence: Not At Risk (10/07/2021)   Humiliation, Afraid, Rape, and Kick questionnaire    Fear of Current or Ex-Partner: No    Emotionally Abused: No    Physically Abused: No    Sexually Abused: No    Outpatient Medications Prior to Visit  Medication Sig Dispense Refill   escitalopram (LEXAPRO) 10 MG tablet Take 1 tablet (10 mg total) by mouth daily. 30 tablet 11   butalbital-acetaminophen-caffeine (FIORICET) 50-325-40 MG tablet Take 1 tablet by mouth every 6 (six) hours as needed for headache. (Patient not taking: Reported on 03/04/2022) 20 tablet 0   ibuprofen (ADVIL) 600 MG tablet Take 1 tablet (600 mg total) by mouth every 6 (six) hours as needed for moderate pain, mild pain or cramping. (Patient not taking: Reported on 03/04/2022) 30 tablet 0   Prenatal MV & Min w/FA-DHA (PRENATAL ADULT GUMMY/DHA/FA) 0.4-25 MG CHEW Chew 1 tablet by mouth daily. (Patient not taking: Reported on 03/04/2022) 90 tablet 3   albuterol (VENTOLIN HFA) 108 (90 Base) MCG/ACT inhaler Inhale 1-2 puffs into the lungs every 6 (six) hours as needed for wheezing or shortness of breath. (Patient not taking: Reported on 03/04/2022) 1 each 1   labetalol (NORMODYNE) 200 MG tablet Take 1 tablet (200 mg total) by mouth 2 (two) times daily. (Patient not taking: Reported on 03/04/2022) 60 tablet 0   No facility-administered medications prior to visit.    Allergies  Allergen Reactions   Augmentin [Amoxicillin-Pot Clavulanate] Rash   Amoxil [Amoxicillin] Hives, Itching and Rash    ROS Review of Systems  Constitutional:  Negative for chills and fever.  Eyes:  Negative for visual disturbance.   Respiratory:  Negative for chest tightness and shortness of breath.   Cardiovascular:  Negative for chest pain and palpitations.  Neurological:  Negative for dizziness and headaches.  Psychiatric/Behavioral:  Negative for self-injury and suicidal ideas.       Objective:    Physical Exam HENT:     Head: Normocephalic.     Nose: No congestion.     Mouth/Throat:     Mouth: Mucous membranes are moist.  Cardiovascular:     Rate and Rhythm: Normal rate and regular rhythm.  Pulses: Normal pulses.     Heart sounds: Normal heart sounds.  Pulmonary:     Effort: Pulmonary effort is normal.     Breath sounds: Normal breath sounds.  Neurological:     Mental Status: She is alert.     BP 132/84   Pulse 77   Ht _0  (1.753 m)   Wt (!) 308 lb (139.7 kg)   LMP 03/01/2022   SpO2 95%   Breastfeeding No   BMI 45.48 kg/m  Wt Readings from Last 3 Encounters:  03/04/22 (!) 308 lb (139.7 kg)  10/07/21 (!) 301 lb (136.5 kg)  09/20/21 (!) 317 lb 0.3 oz (143.8 kg)    Lab Results  Component Value Date   TSH 0.846 03/19/2021   Lab Results  Component Value Date   WBC 6.3 09/20/2021   HGB 13.1 09/20/2021   HCT 38.7 09/20/2021   MCV 91.9 09/20/2021   PLT 243 09/20/2021   Lab Results  Component Value Date   NA 141 09/20/2021   K 4.3 09/20/2021   CO2 24 09/20/2021   GLUCOSE 98 09/20/2021   BUN 10 09/20/2021   CREATININE 1.00 09/20/2021   BILITOT 0.5 09/20/2021   ALKPHOS 71 09/20/2021   AST 29 09/20/2021   ALT 42 09/20/2021   PROT 6.2 (L) 09/20/2021   ALBUMIN 3.6 09/20/2021   CALCIUM 8.8 (L) 09/20/2021   ANIONGAP 7 09/20/2021   EGFR 126 08/04/2021   Lab Results  Component Value Date   CHOL 198 03/19/2021   Lab Results  Component Value Date   HDL 47 03/19/2021   Lab Results  Component Value Date   LDLCALC 128 (H) 03/19/2021   Lab Results  Component Value Date   TRIG 129 03/19/2021   Lab Results  Component Value Date   CHOLHDL 4.2 03/19/2021   Lab Results   Component Value Date   HGBA1C 4.8 12/14/2017      Assessment & Plan:  Essential hypertension Assessment & Plan: Controlled in the clinic Refilled labetalol 200 mg encourage patient to continue taking medication We will follow-up on blood pressure on 03/19/22 Discussed speaking with a therapist to help with coping mechanism for stress management BP Readings from Last 3 Encounters:  03/04/22 132/84  10/07/21 138/90  09/20/21 117/75      Orders: -     Labetalol HCl; Take 1 tablet (200 mg total) by mouth 2 (two) times daily.  Dispense: 60 tablet; Refill: 1 -     CMP14+EGFR -     CBC with Differential/Platelet  Anxiety and depression Assessment & Plan: Will discontinue Lexapro 10 mg daily We will start the patient on Lexapro 20 mg daily She denies suicidal ideation and homicidal ideation Referral placed to integrated behavioral health for talk therapy  Orders: -     Escitalopram Oxalate; Take 1 tablet (20 mg total) by mouth daily.  Dispense: 30 tablet; Refill: 1 -     Amb ref to [facility]  Seasonal allergies -     Albuterol Sulfate HFA; Inhale 1-2 puffs into the lungs every 6 (six) hours as needed for wheezing or shortness of breath.  Dispense: 1 each; Refill: 1  Other hyperlipidemia -     Lipid panel  Other specified hypothyroidism -     TSH + free T4  Encounter for screening for HIV -     HIV Antibody (routine testing w rflx)  Need for hepatitis C screening test -     Hepatitis C antibody  Vitamin D deficiency -     VITAMIN D 25 Hydroxy (Vit-D Deficiency, Fractures)  IFG (impaired fasting glucose) -     Hemoglobin A1c    Follow-up: Return in about 15 days (around 03/19/2022).   Alvira Monday, FNP

## 2022-03-04 NOTE — Assessment & Plan Note (Addendum)
Controlled in the clinic Refilled labetalol 200 mg encourage patient to continue taking medication We will follow-up on blood pressure on 03/19/22 Discussed speaking with a therapist to help with coping mechanism for stress management BP Readings from Last 3 Encounters:  03/04/22 132/84  10/07/21 138/90  09/20/21 117/75

## 2022-03-04 NOTE — Patient Instructions (Addendum)
I appreciate the opportunity to provide care to you today!    Follow up:  03/19/22 bp/depression/ CPE  Labs: please stop by the lab today to get your blood drawn (CBC, CMP, TSH, Lipid profile, HgA1c, Vit D)   Please pick up your medications at the pharmacy   Referrals today-  [facility]   Please continue to a heart-healthy diet and increase your physical activities. Try to exercise for at least three times a week.      It was a pleasure to see you and I look forward to continuing to work together on your health and well-being. Please do not hesitate to call the office if you need care or have questions about your care.   Have a wonderful day and week. With Gratitude, Gilmore Laroche MSN, FNP-BC

## 2022-03-04 NOTE — Assessment & Plan Note (Signed)
Will discontinue Lexapro 10 mg daily We will start the patient on Lexapro 20 mg daily She denies suicidal ideation and homicidal ideation Referral placed to integrated behavioral health for talk therapy

## 2022-03-05 LAB — CBC WITH DIFFERENTIAL/PLATELET
Basophils Absolute: 0.1 10*3/uL (ref 0.0–0.2)
Basos: 1 %
EOS (ABSOLUTE): 0.2 10*3/uL (ref 0.0–0.4)
Eos: 2 %
Hematocrit: 43.7 % (ref 34.0–46.6)
Hemoglobin: 14.6 g/dL (ref 11.1–15.9)
Immature Grans (Abs): 0 10*3/uL (ref 0.0–0.1)
Immature Granulocytes: 0 %
Lymphocytes Absolute: 1.9 10*3/uL (ref 0.7–3.1)
Lymphs: 19 %
MCH: 30.2 pg (ref 26.6–33.0)
MCHC: 33.4 g/dL (ref 31.5–35.7)
MCV: 91 fL (ref 79–97)
Monocytes Absolute: 0.7 10*3/uL (ref 0.1–0.9)
Monocytes: 7 %
Neutrophils Absolute: 7 10*3/uL (ref 1.4–7.0)
Neutrophils: 71 %
Platelets: 292 10*3/uL (ref 150–450)
RBC: 4.83 x10E6/uL (ref 3.77–5.28)
RDW: 12.7 % (ref 11.7–15.4)
WBC: 9.9 10*3/uL (ref 3.4–10.8)

## 2022-03-05 LAB — LIPID PANEL
Chol/HDL Ratio: 4.4 ratio (ref 0.0–4.4)
Cholesterol, Total: 195 mg/dL (ref 100–199)
HDL: 44 mg/dL (ref >39)
LDL Chol Calc (NIH): 138 mg/dL — ABNORMAL HIGH (ref 0–99)
Triglycerides: 70 mg/dL (ref 0–149)
VLDL Cholesterol Cal: 13 mg/dL (ref 5–40)

## 2022-03-05 LAB — CMP14+EGFR
ALT: 17 IU/L (ref 0–32)
AST: 22 IU/L (ref 0–40)
Albumin/Globulin Ratio: 1.9 (ref 1.2–2.2)
Albumin: 4.4 g/dL (ref 4.0–5.0)
Alkaline Phosphatase: 93 IU/L (ref 44–121)
BUN/Creatinine Ratio: 13 (ref 9–23)
BUN: 13 mg/dL (ref 6–20)
Bilirubin Total: 0.4 mg/dL (ref 0.0–1.2)
CO2: 22 mmol/L (ref 20–29)
Calcium: 9.2 mg/dL (ref 8.7–10.2)
Chloride: 104 mmol/L (ref 96–106)
Creatinine, Ser: 0.99 mg/dL (ref 0.57–1.00)
Globulin, Total: 2.3 g/dL (ref 1.5–4.5)
Glucose: 96 mg/dL (ref 70–99)
Potassium: 4.5 mmol/L (ref 3.5–5.2)
Sodium: 141 mmol/L (ref 134–144)
Total Protein: 6.7 g/dL (ref 6.0–8.5)
eGFR: 79 mL/min/{1.73_m2} (ref >59)

## 2022-03-05 LAB — HEMOGLOBIN A1C
Est. average glucose Bld gHb Est-mCnc: 103 mg/dL
Hgb A1c MFr Bld: 5.2 % (ref 4.8–5.6)

## 2022-03-05 LAB — TSH+FREE T4
Free T4: 1.07 ng/dL (ref 0.82–1.77)
TSH: 2.25 u[IU]/mL (ref 0.450–4.500)

## 2022-03-05 LAB — HEPATITIS C ANTIBODY: Hep C Virus Ab: NONREACTIVE

## 2022-03-05 LAB — VITAMIN D 25 HYDROXY (VIT D DEFICIENCY, FRACTURES): Vit D, 25-Hydroxy: 20.8 ng/mL — ABNORMAL LOW (ref 30.0–100.0)

## 2022-03-05 LAB — HIV ANTIBODY (ROUTINE TESTING W REFLEX): HIV Screen 4th Generation wRfx: NONREACTIVE

## 2022-03-07 ENCOUNTER — Other Ambulatory Visit: Payer: Self-pay | Admitting: Family Medicine

## 2022-03-07 DIAGNOSIS — E559 Vitamin D deficiency, unspecified: Secondary | ICD-10-CM

## 2022-03-07 MED ORDER — VITAMIN D (ERGOCALCIFEROL) 1.25 MG (50000 UNIT) PO CAPS: 50000.0000 [IU] | ORAL_CAPSULE | ORAL | 3 refills | Status: DC

## 2022-03-07 NOTE — Progress Notes (Signed)
A prescription for weekly vitamin D supplements is sent to the pharmacy.  Your vitamin D is low.  Your LDL cholesterol is slightly elevated; I recommend a low-carb and fat diet with increased physical activities.  All other labs are stable.

## 2022-03-18 ENCOUNTER — Telehealth: Payer: Self-pay | Admitting: Clinical

## 2022-03-18 NOTE — Telephone Encounter (Signed)
Attempt call regarding referral; Left HIPPA-compliant message to call back Brogan Martis from [facility] at  [phone] (Raffaella Edison's office).    

## 2022-03-19 ENCOUNTER — Encounter: Payer: Medicaid Other | Admitting: Family Medicine

## 2022-03-19 ENCOUNTER — Encounter: Payer: Self-pay | Admitting: Family Medicine

## 2022-04-20 ENCOUNTER — Encounter: Payer: Medicaid Other | Admitting: Family Medicine

## 2022-04-29 ENCOUNTER — Ambulatory Visit
Admission: EM | Admit: 2022-04-29 | Discharge: 2022-04-29 | Disposition: A | Discharge status: Home / Self Care | Payer: Medicaid Other | Attending: Family Medicine | Admitting: Family Medicine

## 2022-04-29 DIAGNOSIS — R051 Acute cough: Secondary | ICD-10-CM

## 2022-04-29 DIAGNOSIS — J3089 Other allergic rhinitis: Secondary | ICD-10-CM | POA: Diagnosis not present

## 2022-04-29 DIAGNOSIS — R062 Wheezing: Secondary | ICD-10-CM | POA: Diagnosis not present

## 2022-04-29 MED ORDER — PREDNISONE 20 MG PO TABS: 40.0000 mg | ORAL_TABLET | Freq: Every day | ORAL | 0 refills | Status: DC

## 2022-04-29 MED ORDER — PROMETHAZINE-DM 6.25-15 MG/5ML PO SYRP: 5.0000 mL | ORAL_SOLUTION | Freq: Four times a day (QID) | ORAL | 0 refills | Status: DC | PRN

## 2022-04-29 MED ORDER — CETIRIZINE HCL 10 MG PO TABS
10.0000 mg | ORAL_TABLET | Freq: Every day | ORAL | 2 refills | Status: DC
Start: 2022-04-29 — End: 2022-08-19

## 2022-04-29 MED ORDER — ALBUTEROL SULFATE HFA 108 (90 BASE) MCG/ACT IN AERS: 2.0000 | INHALATION_SPRAY | RESPIRATORY_TRACT | 0 refills | Status: DC | PRN

## 2022-04-29 NOTE — ED Provider Notes (Signed)
[facility]    CSN: [id number] Arrival date & time: 04/29/22  1122      History   Chief Complaint Chief Complaint  Patient presents with   Nasal Congestion         HPI Kylie Bailey is a 32 y.o. female.   Presenting today with 2-week history of nasal congestion, hacking productive cough, wheezing, chest tightness.  Denies fever, chills, chest pain, shortness of breath, abdominal pain, nausea vomiting or diarrhea.  So far not trying anything other than the Mucinex here and there with minimal relief.  Children all sick with similar symptoms.    Past Medical History:  Diagnosis Date   Anxiety    Bipolar disorder ([place])    Bulimia    Depression    GERD (gastroesophageal reflux disease)    Hypertension    Migraines    Panic attacks    Pregnancy induced hypertension    Seasonal allergies     Patient Active Problem List   Diagnosis Date Noted   SVD (spontaneous vaginal delivery) 08/25/2021   Maternal obesity affecting pregnancy, antepartum 08/24/2021   Abnormal chromosomal and genetic finding on antenatal screening mother 03/27/2021   Constipation 03/19/2021   Abnormal Pap smear of cervix 03/16/2021   Supervision of high-risk pregnancy 03/09/2021   Smoker 03/09/2021   Upper respiratory tract infection 08/15/2020   Anxiety and depression 04/02/2020   Chronic hypertension affecting pregnancy 06/27/2018   Bipolar disorder ([place]) 10/07/2017   Skin tag 10/07/2017   Seasonal allergies 08/18/2017   Essential hypertension 12/03/2016   Panic attacks 12/11/2015   Migraine 12/11/2015   Obesity 12/11/2015    Past Surgical History:  Procedure Laterality Date   INDUCED ABORTION  07/20/2020   TONSILLECTOMY     TYMPANOSTOMY TUBE PLACEMENT Bilateral     OB History     Gravida  4   Para  3   Term  3   Preterm      AB  1   Living  3      SAB      IAB      Ectopic      Multiple  0   Live Births  3            Home Medications     Prior to Admission medications   Medication Sig Start Date End Date Taking? Authorizing Provider  albuterol (VENTOLIN HFA) 108 (90 Base) MCG/ACT inhaler Inhale 2 puffs into the lungs every 4 (four) hours as needed for wheezing or shortness of breath. 04/29/22  Yes Volney American, PA-C  cetirizine (ZYRTEC ALLERGY) 10 MG tablet Take 1 tablet (10 mg total) by mouth daily. 04/29/22  Yes Volney American, PA-C  predniSONE (DELTASONE) 20 MG tablet Take 2 tablets (40 mg total) by mouth daily with breakfast. 04/29/22  Yes Volney American, PA-C  promethazine-dextromethorphan (PROMETHAZINE-DM) 6.25-15 MG/5ML syrup Take 5 mLs by mouth 4 (four) times daily as needed. 04/29/22  Yes Volney American, PA-C  albuterol (VENTOLIN HFA) 108 (90 Base) MCG/ACT inhaler Inhale 1-2 puffs into the lungs every 6 (six) hours as needed for wheezing or shortness of breath. 03/04/22   Alvira Monday, FNP  butalbital-acetaminophen-caffeine (FIORICET) [phone] MG tablet Take 1 tablet by mouth every 6 (six) hours as needed for headache. Patient not taking: Reported on 03/04/2022 09/20/21 09/20/22  Evalee Jefferson, PA-C  escitalopram (LEXAPRO) 20 MG tablet Take 1 tablet (20 mg total) by mouth daily. 03/04/22   Alvira Monday, [place]  ibuprofen (ADVIL) 600 MG tablet Take 1 tablet (600 mg total) by mouth every 6 (six) hours as needed for moderate pain, mild pain or cramping. Patient not taking: Reported on 03/04/2022 08/26/21   Roma Schanz, CNM  labetalol (NORMODYNE) 200 MG tablet TAKE 1 TABLET(200 MG) BY MOUTH TWICE DAILY 03/04/22   Alvira Monday, FNP  Prenatal MV & Min w/FA-DHA (PRENATAL ADULT GUMMY/DHA/FA) 0.4-25 MG CHEW Chew 1 tablet by mouth daily. Patient not taking: Reported on 03/04/2022 10/31/17   Lovenia Kim, MD  Vitamin D, Ergocalciferol, (DRISDOL) 1.25 MG (50000 UNIT) CAPS capsule Take 1 capsule (50,000 Units total) by mouth every 7 (seven) days. 03/07/22   Alvira Monday, FNP  famotidine  (PEPCID) 20 MG tablet Take 1 tablet (20 mg total) by mouth 2 (two) times daily. 09/02/18 03/08/19  Charlesetta Shanks, MD  hydrochlorothiazide (MICROZIDE) 12.5 MG capsule Take 1 capsule (12.5 mg total) by mouth daily. 08/31/18 03/08/19  Diallo, Earna Coder, MD  loratadine (CLARITIN) 10 MG tablet Take 1 tablet (10 mg total) by mouth daily. 06/21/18 03/08/19  [name], [place], DO  verapamil (CALAN-SR) 120 MG CR tablet Take 1 tablet (120 mg total) by mouth daily. 06/29/18 03/08/19  Glenice Bow, DO    Family History Family History  Problem Relation Age of Onset   Diabetes Paternal Grandmother    High blood pressure Paternal Grandmother    Heart disease Paternal Grandmother    Cancer Paternal Grandmother    Drug abuse Father    Hypertension Mother    Heart attack Mother    High Cholesterol Brother    Hypertension Brother    Cancer Daughter        bone cancer   Heart disease Maternal Uncle    Colon cancer Neg Hx    Breast cancer Neg Hx    Lung cancer Neg Hx     Social History Social History   Tobacco Use   Smoking status: Every Day    Packs/day: 0.50    Years: 5.00    Total pack years: 2.50    Types: Cigarettes   Smokeless tobacco: Never   Tobacco comments:    3 cigarettes daily   Vaping Use   Vaping Use: Never used  Substance Use Topics   Alcohol use: Not Currently    Comment: 1 glass of wine 2 nights per week   Drug use: No     Allergies   Augmentin [amoxicillin-pot clavulanate] and Amoxil [amoxicillin]   Review of Systems Review of Systems Per HPI  Physical Exam Triage Vital Signs ED Triage Vitals  Enc Vitals Group     BP 04/29/22 1157 124/83     Pulse Rate 04/29/22 1157 90     Resp 04/29/22 1157 18     Temp 04/29/22 1157 98.1 F (36.7 C)     Temp Source 04/29/22 1149 Oral     SpO2 04/29/22 1157 96 %     Weight --      Height --      Head Circumference --      Peak Flow --      Pain Score 04/29/22 1153 0     Pain Loc --      Pain Edu? --      Excl. in [place]?  --    No data found.  Updated Vital Signs BP 124/83 (BP Location: Right Arm)   Pulse 90   Temp 98.1 F (36.7 C) (Oral)   Resp 18   LMP 04/26/2022  SpO2 96%   Breastfeeding No   Visual Acuity Right Eye Distance:   Left Eye Distance:   Bilateral Distance:    Right Eye Near:   Left Eye Near:    Bilateral Near:     Physical Exam Vitals and nursing note reviewed.  Constitutional:      Appearance: Normal appearance. She is not ill-appearing.  HENT:     Head: Atraumatic.     Nose: Congestion present.     Mouth/Throat:     Mouth: Mucous membranes are moist.     Pharynx: Posterior oropharyngeal erythema present.  Eyes:     Extraocular Movements: Extraocular movements intact.     Conjunctiva/sclera: Conjunctivae normal.  Cardiovascular:     Rate and Rhythm: Normal rate and regular rhythm.     Heart sounds: Normal heart sounds.  Pulmonary:     Effort: Pulmonary effort is normal.     Breath sounds: Wheezing present. No rales.  Musculoskeletal:        General: Normal range of motion.     Cervical back: Normal range of motion and neck supple.  Skin:    General: Skin is warm and dry.  Neurological:     Mental Status: She is alert and oriented to person, place, and time.     Motor: No weakness.     Gait: Gait normal.  Psychiatric:        Mood and Affect: Mood normal.        Thought Content: Thought content normal.        Judgment: Judgment normal.    UC Treatments / Results  Labs (all labs ordered are listed, but only abnormal results are displayed) Labs Reviewed - No data to display  EKG   Radiology No results found.  Procedures Procedures (including critical care time)  Medications Ordered in UC Medications - No data to display  Initial Impression / Assessment and Plan / UC Course  I have reviewed the triage vital signs and the nursing notes.  Pertinent labs & imaging results that were available during my care of the patient were reviewed by me and  considered in my medical decision making (see chart for details).     Suspect uncontrolled seasonal allergies leading to some reactive airway/wheezing.  Treat with prednisone, Zyrtec, Phenergan DM, albuterol inhaler and supportive over-the-counter medications and home care.  Return for worsening symptoms. Final Clinical Impressions(s) / UC Diagnoses   Final diagnoses:  Seasonal allergic rhinitis due to other allergic trigger  Wheezing  Acute cough   Discharge Instructions   None    ED Prescriptions     Medication Sig Dispense Auth. Provider   predniSONE (DELTASONE) 20 MG tablet Take 2 tablets (40 mg total) by mouth daily with breakfast. 10 tablet Particia Nearing, PA-C   albuterol (VENTOLIN HFA) 108 (90 Base) MCG/ACT inhaler Inhale 2 puffs into the lungs every 4 (four) hours as needed for wheezing or shortness of breath. 18 g Particia Nearing, [place]   cetirizine (ZYRTEC ALLERGY) 10 MG tablet Take 1 tablet (10 mg total) by mouth daily. 30 tablet Particia Nearing, [place]   promethazine-dextromethorphan (PROMETHAZINE-DM) 6.25-15 MG/5ML syrup Take 5 mLs by mouth 4 (four) times daily as needed. 100 mL Particia Nearing, [place]      PDMP not reviewed this encounter.   Particia Nearing, [place] 04/29/22 1505

## 2022-04-29 NOTE — ED Triage Notes (Signed)
Pt reports chest congestion x 2 weeks. Mucinex gives no relief.

## 2022-05-19 ENCOUNTER — Encounter: Payer: Medicaid Other | Admitting: Family Medicine

## 2022-05-20 ENCOUNTER — Encounter: Payer: Self-pay | Admitting: Family Medicine

## 2022-06-11 ENCOUNTER — Other Ambulatory Visit: Payer: Self-pay | Admitting: Family Medicine

## 2022-06-11 DIAGNOSIS — F32A Depression, unspecified: Secondary | ICD-10-CM

## 2022-08-19 ENCOUNTER — Encounter: Payer: Self-pay | Admitting: Emergency Medicine

## 2022-08-19 ENCOUNTER — Ambulatory Visit
Admission: EM | Admit: 2022-08-19 | Discharge: 2022-08-19 | Disposition: A | Discharge status: Home / Self Care | Payer: Medicaid Other | Attending: Nurse Practitioner | Admitting: Nurse Practitioner

## 2022-08-19 DIAGNOSIS — T63441A Toxic effect of venom of bees, accidental (unintentional), initial encounter: Secondary | ICD-10-CM | POA: Diagnosis not present

## 2022-08-19 DIAGNOSIS — L03113 Cellulitis of right upper limb: Secondary | ICD-10-CM | POA: Diagnosis not present

## 2022-08-19 MED ORDER — PREDNISONE 20 MG PO TABS: 40.0000 mg | ORAL_TABLET | Freq: Every day | ORAL | 0 refills | Status: AC

## 2022-08-19 MED ORDER — EPINEPHRINE 0.3 MG/0.3ML IJ SOAJ: 0.3000 mg | INTRAMUSCULAR | 0 refills | Status: AC | PRN

## 2022-08-19 NOTE — ED Provider Notes (Signed)
[facility]    CSN: [id number] Arrival date & time: 08/19/22  0803      History   Chief Complaint No chief complaint on file.   HPI Kylie Bailey is a 32 y.o. female.   The history is provided by the patient.   Patient presents after bee sting to the right forearm that occurred over the past 3 days.  Patient states since that time, she has been "sweating more".  She states the area is red, warm, and appears to be spreading.  She also states that the area is itchy.  She denies fever, chills, chest pain, shortness of breath, difficulty breathing, nausea, vomiting, or diarrhea.  Patient states that initially when the bee sting occurred, she did feel some scratchiness in her throat.  Patient reports that she does have an EpiPen, but it has expired.  Patient reports she has been taking Benadryl, ibuprofen, and using ice packs to the right forearm. Past Medical History:  Diagnosis Date   Anxiety    Bipolar disorder ([facility])    Bulimia    Depression    GERD (gastroesophageal reflux disease)    Hypertension    Migraines    Panic attacks    Pregnancy induced hypertension    Seasonal allergies     Patient Active Problem List   Diagnosis Date Noted   SVD (spontaneous vaginal delivery) 08/25/2021   Maternal obesity affecting pregnancy, antepartum 08/24/2021   Abnormal chromosomal and genetic finding on antenatal screening mother 03/27/2021   Constipation 03/19/2021   Abnormal Pap smear of cervix 03/16/2021   Supervision of high-risk pregnancy 03/09/2021   Smoker 03/09/2021   Upper respiratory tract infection 08/15/2020   Anxiety and depression 04/02/2020   Chronic hypertension affecting pregnancy 06/27/2018   Bipolar disorder ([facility]) 10/07/2017   Skin tag 10/07/2017   Seasonal allergies 08/18/2017   Essential hypertension 12/03/2016   Panic attacks 12/11/2015   Migraine 12/11/2015   Obesity 12/11/2015    Past Surgical History:  Procedure Laterality Date    INDUCED ABORTION  07/20/2020   TONSILLECTOMY     TYMPANOSTOMY TUBE PLACEMENT Bilateral     OB History     Gravida  4   Para  3   Term  3   Preterm      AB  1   Living  3      SAB      IAB      Ectopic      Multiple  0   Live Births  3            Home Medications    Prior to Admission medications   Medication Sig Start Date End Date Taking? Authorizing Provider  EPINEPHrine 0.3 mg/0.3 mL IJ SOAJ injection Inject 0.3 mg into the muscle as needed for anaphylaxis. 08/19/22  Yes Tyquisha Sharps-Warren, Sadie Haber, NP  predniSONE (DELTASONE) 20 MG tablet Take 2 tablets (40 mg total) by mouth daily with breakfast for 5 days. 08/19/22 08/24/22 Yes Autumn Gunn-Warren, Sadie Haber, NP  ibuprofen (ADVIL) 600 MG tablet Take 1 tablet (600 mg total) by mouth every 6 (six) hours as needed for moderate pain, mild pain or cramping. Patient not taking: Reported on 03/04/2022 08/26/21   Cheral Marker, CNM  labetalol (NORMODYNE) 200 MG tablet TAKE 1 TABLET(200 MG) BY MOUTH TWICE DAILY 03/04/22   Gilmore Laroche, FNP  famotidine (PEPCID) 20 MG tablet Take 1 tablet (20 mg total) by mouth 2 (two) times daily. 09/02/18 03/08/19  Pfeiffer,  Lebron Conners, MD  hydrochlorothiazide (MICROZIDE) 12.5 MG capsule Take 1 capsule (12.5 mg total) by mouth daily. 08/31/18 03/08/19  Diallo, Lilia Argue, MD  loratadine (CLARITIN) 10 MG tablet Take 1 tablet (10 mg total) by mouth daily. 06/21/18 03/08/19  [name], [place], DO  verapamil (CALAN-SR) 120 MG CR tablet Take 1 tablet (120 mg total) by mouth daily. 06/29/18 03/08/19  Tamera Stands, DO    Family History Family History  Problem Relation Age of Onset   Diabetes Paternal Grandmother    High blood pressure Paternal Grandmother    Heart disease Paternal Grandmother    Cancer Paternal Grandmother    Drug abuse Father    Hypertension Mother    Heart attack Mother    High Cholesterol Brother    Hypertension Brother    Cancer Daughter        bone cancer   Heart disease  Maternal Uncle    Colon cancer Neg Hx    Breast cancer Neg Hx    Lung cancer Neg Hx     Social History Social History   Tobacco Use   Smoking status: Every Day    Packs/day: 0.50    Years: 5.00    Additional pack years: 0.00    Total pack years: 2.50    Types: Cigarettes   Smokeless tobacco: Never   Tobacco comments:    3 cigarettes daily   Vaping Use   Vaping Use: Never used  Substance Use Topics   Alcohol use: Not Currently    Comment: 1 glass of wine 2 nights per week   Drug use: No     Allergies   Augmentin [amoxicillin-pot clavulanate] and Amoxil [amoxicillin]   Review of Systems Review of Systems Per HPI  Physical Exam Triage Vital Signs ED Triage Vitals  Enc Vitals Group     BP 08/19/22 0813 (!) 151/84     Pulse Rate 08/19/22 0813 86     Resp 08/19/22 0813 17     Temp 08/19/22 0813 98.1 F (36.7 C)     Temp Source 08/19/22 0813 Oral     SpO2 08/19/22 0813 97 %     Weight --      Height --      Head Circumference --      Peak Flow --      Pain Score 08/19/22 0816 6     Pain Loc --      Pain Edu? --      Excl. in [place]? --    No data found.  Updated Vital Signs BP (!) 151/84 (BP Location: Right Arm)   Pulse 86   Temp 98.1 F (36.7 C) (Oral)   Resp 17   SpO2 97%   Visual Acuity Right Eye Distance:   Left Eye Distance:   Bilateral Distance:    Right Eye Near:   Left Eye Near:    Bilateral Near:     Physical Exam Vitals and nursing note reviewed.  Constitutional:      General: She is not in acute distress.    Appearance: Normal appearance.  HENT:     Head: Normocephalic.     Right Ear: Tympanic membrane, ear canal and external ear normal.     Left Ear: Tympanic membrane, ear canal and external ear normal.     Nose: Nose normal.     Mouth/Throat:     Mouth: Mucous membranes are moist.     Comments: Airway is clear and patent, no obstruction present. Eyes:  Extraocular Movements: Extraocular movements intact.      Conjunctiva/sclera: Conjunctivae normal.     Pupils: Pupils are equal, round, and reactive to light.  Cardiovascular:     Rate and Rhythm: Normal rate and regular rhythm.     Pulses: Normal pulses.     Heart sounds: Normal heart sounds.  Pulmonary:     Effort: Pulmonary effort is normal. No respiratory distress.     Breath sounds: Normal breath sounds. No stridor. No wheezing, rhonchi or rales.  Abdominal:     General: Bowel sounds are normal.     Palpations: Abdomen is soft.     Tenderness: There is no abdominal tenderness.  Musculoskeletal:     Cervical back: Normal range of motion.  Lymphadenopathy:     Cervical: No cervical adenopathy.  Skin:    General: Skin is warm and dry.     Comments: Area of erythema noted to the anterior aspect of the right forearm.  Area measures approximately 12 cm in diameter.  Area is mildly warm to palpation, there is no firmness, oozing, fluctuance, or drainage present.  Neurological:     General: No focal deficit present.     Mental Status: She is alert and oriented to person, place, and time.  Psychiatric:        Mood and Affect: Mood normal.        Behavior: Behavior normal.      UC Treatments / Results  Labs (all labs ordered are listed, but only abnormal results are displayed) Labs Reviewed - No data to display  EKG   Radiology No results found.  Procedures Procedures (including critical care time)  Medications Ordered in UC Medications - No data to display  Initial Impression / Assessment and Plan / UC Course  I have reviewed the triage vital signs and the nursing notes.  Pertinent labs & imaging results that were available during my care of the patient were reviewed by me and considered in my medical decision making (see chart for details).  The patient is well-appearing, she is in no acute distress, vital signs are stable.  Will treat for localized cellulitis of the right forearm with prednisone 40 mg for the next 5 days.   Do not suspect infection as patient's vital signs are stable, the area is not indurated, draining, and is not fluctuant.  Patient was also prescribed an EpiPen 0.3 mg to use as needed to prevent anaphylaxis.  Supportive care recommendations were provided and discussed with the patient to include continuing Benadryl as needed for itching, she can take Zyrtec during the day, continue cool compresses, and continue to monitor the site for worsening.  Patient is in agreement with this plan of care and verbalizes understanding.  All questions were answered.  Patient stable for discharge.   Final Clinical Impressions(s) / UC Diagnoses   Final diagnoses:  Cellulitis of right forearm  Bee sting reaction, accidental or unintentional, initial encounter     Discharge Instructions      Take medication as prescribed. May continue Benadryl at bedtime to help with itching.  You can take Zyrtec during the day as needed. May take Tylenol as needed for pain, fever, or general discomfort.  You can begin taking ibuprofen again once you have completed the prednisone. Continue cool compresses to the right forearm as needed for pain, swelling, or itching. Continue to monitor the area for worsening.  If the area becomes firm, redness extends up the arm, or if you develop foul-smelling  drainage from the site, please follow-up in this clinic immediately for further evaluation. Follow-up as needed.     ED Prescriptions     Medication Sig Dispense Auth. Provider   predniSONE (DELTASONE) 20 MG tablet Take 2 tablets (40 mg total) by mouth daily with breakfast for 5 days. 10 tablet Adalyne Lovick-Warren, Sadie Haber, NP   EPINEPHrine 0.3 mg/0.3 mL IJ SOAJ injection Inject 0.3 mg into the muscle as needed for anaphylaxis. 1 each Daya Dutt-Warren, Sadie Haber, NP      PDMP not reviewed this encounter.   Abran Cantor, NP 08/19/22 [phone]

## 2022-08-19 NOTE — ED Triage Notes (Signed)
Bee sting 3 days ago on right forearm.  Area red and warm.  Has been taking benadryl and ibuprofen.  States since she got stung, she has been sweating a lot.

## 2022-08-19 NOTE — Discharge Instructions (Signed)
Take medication as prescribed. May continue Benadryl at bedtime to help with itching.  You can take Zyrtec during the day as needed. May take Tylenol as needed for pain, fever, or general discomfort.  You can begin taking ibuprofen again once you have completed the prednisone. Continue cool compresses to the right forearm as needed for pain, swelling, or itching. Continue to monitor the area for worsening.  If the area becomes firm, redness extends up the arm, or if you develop foul-smelling drainage from the site, please follow-up in this clinic immediately for further evaluation. Follow-up as needed.

## 2022-11-16 DIAGNOSIS — I10 Essential (primary) hypertension: Secondary | ICD-10-CM | POA: Diagnosis not present

## 2022-11-16 DIAGNOSIS — N888 Other specified noninflammatory disorders of cervix uteri: Secondary | ICD-10-CM | POA: Diagnosis not present

## 2022-11-16 DIAGNOSIS — Z0000 Encounter for general adult medical examination without abnormal findings: Secondary | ICD-10-CM | POA: Diagnosis not present

## 2022-11-16 DIAGNOSIS — R5382 Chronic fatigue, unspecified: Secondary | ICD-10-CM | POA: Diagnosis not present

## 2022-11-16 DIAGNOSIS — Z113 Encounter for screening for infections with a predominantly sexual mode of transmission: Secondary | ICD-10-CM | POA: Diagnosis not present

## 2022-11-16 DIAGNOSIS — Z124 Encounter for screening for malignant neoplasm of cervix: Secondary | ICD-10-CM | POA: Diagnosis not present

## 2022-12-03 DIAGNOSIS — E782 Mixed hyperlipidemia: Secondary | ICD-10-CM | POA: Diagnosis not present

## 2022-12-03 DIAGNOSIS — I10 Essential (primary) hypertension: Secondary | ICD-10-CM | POA: Diagnosis not present

## 2022-12-03 DIAGNOSIS — F411 Generalized anxiety disorder: Secondary | ICD-10-CM | POA: Diagnosis not present

## 2022-12-03 DIAGNOSIS — F321 Major depressive disorder, single episode, moderate: Secondary | ICD-10-CM | POA: Diagnosis not present

## 2023-02-02 DIAGNOSIS — F321 Major depressive disorder, single episode, moderate: Secondary | ICD-10-CM | POA: Diagnosis not present

## 2023-05-05 DIAGNOSIS — I10 Essential (primary) hypertension: Secondary | ICD-10-CM | POA: Diagnosis not present

## 2023-05-05 DIAGNOSIS — F321 Major depressive disorder, single episode, moderate: Secondary | ICD-10-CM | POA: Diagnosis not present

## 2023-05-05 DIAGNOSIS — Z7689 Persons encountering health services in other specified circumstances: Secondary | ICD-10-CM | POA: Diagnosis not present

## 2023-05-05 DIAGNOSIS — E782 Mixed hyperlipidemia: Secondary | ICD-10-CM | POA: Diagnosis not present

## 2023-06-23 DIAGNOSIS — H5213 Myopia, bilateral: Secondary | ICD-10-CM | POA: Diagnosis not present

## 2023-08-01 DIAGNOSIS — F321 Major depressive disorder, single episode, moderate: Secondary | ICD-10-CM | POA: Diagnosis not present

## 2023-08-01 DIAGNOSIS — I10 Essential (primary) hypertension: Secondary | ICD-10-CM | POA: Diagnosis not present

## 2023-08-01 DIAGNOSIS — E782 Mixed hyperlipidemia: Secondary | ICD-10-CM | POA: Diagnosis not present

## 2023-08-01 DIAGNOSIS — Z7689 Persons encountering health services in other specified circumstances: Secondary | ICD-10-CM | POA: Diagnosis not present

## 2023-11-02 DIAGNOSIS — D2261 Melanocytic nevi of right upper limb, including shoulder: Secondary | ICD-10-CM | POA: Diagnosis not present

## 2023-11-02 DIAGNOSIS — R2231 Localized swelling, mass and lump, right upper limb: Secondary | ICD-10-CM | POA: Diagnosis not present

## 2023-11-02 DIAGNOSIS — Z7689 Persons encountering health services in other specified circumstances: Secondary | ICD-10-CM | POA: Diagnosis not present

## 2023-11-02 DIAGNOSIS — R6884 Jaw pain: Secondary | ICD-10-CM | POA: Diagnosis not present

## 2023-11-07 DIAGNOSIS — D2261 Melanocytic nevi of right upper limb, including shoulder: Secondary | ICD-10-CM | POA: Diagnosis not present
